# Patient Record
Sex: Female | Born: 1935 | Race: White | Hispanic: No | Marital: Married | Smoking: Never smoker
Health system: Southern US, Community
[De-identification: ages and names within clinical notes are randomized; demographics above are authoritative.]

## PROBLEM LIST (undated history)

## (undated) DIAGNOSIS — K759 Inflammatory liver disease, unspecified: Secondary | ICD-10-CM

## (undated) DIAGNOSIS — G4730 Sleep apnea, unspecified: Secondary | ICD-10-CM

## (undated) DIAGNOSIS — IMO0001 Reserved for inherently not codable concepts without codable children: Secondary | ICD-10-CM

## (undated) DIAGNOSIS — I341 Nonrheumatic mitral (valve) prolapse: Secondary | ICD-10-CM

## (undated) DIAGNOSIS — M1990 Unspecified osteoarthritis, unspecified site: Secondary | ICD-10-CM

## (undated) DIAGNOSIS — K219 Gastro-esophageal reflux disease without esophagitis: Secondary | ICD-10-CM

## (undated) DIAGNOSIS — I10 Essential (primary) hypertension: Secondary | ICD-10-CM

## (undated) DIAGNOSIS — J40 Bronchitis, not specified as acute or chronic: Secondary | ICD-10-CM

## (undated) DIAGNOSIS — E785 Hyperlipidemia, unspecified: Secondary | ICD-10-CM

## (undated) DIAGNOSIS — Z5189 Encounter for other specified aftercare: Secondary | ICD-10-CM

## (undated) DIAGNOSIS — J449 Chronic obstructive pulmonary disease, unspecified: Secondary | ICD-10-CM

## (undated) DIAGNOSIS — D649 Anemia, unspecified: Secondary | ICD-10-CM

## (undated) DIAGNOSIS — M7580 Other shoulder lesions, unspecified shoulder: Secondary | ICD-10-CM

## (undated) HISTORY — DX: Essential (primary) hypertension: I10

## (undated) HISTORY — PX: ANTERIOR CERVICAL DECOMP/DISCECTOMY FUSION: SHX1161

## (undated) HISTORY — DX: Nonrheumatic mitral (valve) prolapse: I34.1

## (undated) HISTORY — PX: HERNIA REPAIR: SHX51

## (undated) HISTORY — DX: Hyperlipidemia, unspecified: E78.5

## (undated) HISTORY — DX: Reserved for inherently not codable concepts without codable children: IMO0001

## (undated) HISTORY — PX: UVULECTOMY: SHX2631

## (undated) HISTORY — DX: Anemia, unspecified: D64.9

## (undated) HISTORY — PX: KNEE ARTHROSCOPY: SUR90

## (undated) HISTORY — PX: SPINE SURGERY: SHX786

## (undated) HISTORY — PX: HEMORRHOID SURGERY: SHX153

## (undated) HISTORY — DX: Other shoulder lesions, unspecified shoulder: M75.80

## (undated) HISTORY — DX: Chronic obstructive pulmonary disease, unspecified: J44.9

## (undated) HISTORY — DX: Encounter for other specified aftercare: Z51.89

---

## 1962-11-10 HISTORY — PX: ABDOMINAL HYSTERECTOMY: SHX81

## 1998-07-24 ENCOUNTER — Ambulatory Visit (HOSPITAL_COMMUNITY): Admission: RE | Admit: 1998-07-24 | Discharge: 1998-07-24 | Payer: Self-pay | Admitting: Family Medicine

## 1998-08-16 ENCOUNTER — Encounter: Payer: Self-pay | Admitting: Family Medicine

## 1998-08-16 ENCOUNTER — Ambulatory Visit (HOSPITAL_COMMUNITY): Admission: RE | Admit: 1998-08-16 | Discharge: 1998-08-16 | Payer: Self-pay | Admitting: Family Medicine

## 1998-10-10 ENCOUNTER — Ambulatory Visit (HOSPITAL_COMMUNITY): Admission: RE | Admit: 1998-10-10 | Discharge: 1998-10-10 | Payer: Self-pay | Admitting: Gastroenterology

## 1998-11-10 HISTORY — PX: TONGUE BIOPSY: SHX1075

## 1999-06-10 ENCOUNTER — Ambulatory Visit: Admission: RE | Admit: 1999-06-10 | Discharge: 1999-06-10 | Payer: Self-pay | Admitting: *Deleted

## 1999-09-12 ENCOUNTER — Ambulatory Visit (HOSPITAL_COMMUNITY): Admission: RE | Admit: 1999-09-12 | Discharge: 1999-09-12 | Payer: Self-pay | Admitting: Family Medicine

## 1999-09-12 ENCOUNTER — Encounter: Payer: Self-pay | Admitting: Family Medicine

## 2000-10-28 ENCOUNTER — Encounter: Payer: Self-pay | Admitting: Family Medicine

## 2000-10-28 ENCOUNTER — Ambulatory Visit (HOSPITAL_COMMUNITY): Admission: RE | Admit: 2000-10-28 | Discharge: 2000-10-28 | Payer: Self-pay | Admitting: Family Medicine

## 2001-04-01 ENCOUNTER — Encounter (INDEPENDENT_AMBULATORY_CARE_PROVIDER_SITE_OTHER): Payer: Self-pay | Admitting: Specialist

## 2001-04-01 ENCOUNTER — Ambulatory Visit (HOSPITAL_BASED_OUTPATIENT_CLINIC_OR_DEPARTMENT_OTHER): Admission: RE | Admit: 2001-04-01 | Discharge: 2001-04-02 | Payer: Self-pay | Admitting: *Deleted

## 2001-10-28 ENCOUNTER — Other Ambulatory Visit: Admission: RE | Admit: 2001-10-28 | Discharge: 2001-10-28 | Payer: Self-pay | Admitting: Family Medicine

## 2001-10-29 ENCOUNTER — Encounter: Payer: Self-pay | Admitting: Family Medicine

## 2001-10-29 ENCOUNTER — Ambulatory Visit (HOSPITAL_COMMUNITY): Admission: RE | Admit: 2001-10-29 | Discharge: 2001-10-29 | Payer: Self-pay | Admitting: Family Medicine

## 2002-11-16 ENCOUNTER — Ambulatory Visit (HOSPITAL_COMMUNITY): Admission: RE | Admit: 2002-11-16 | Discharge: 2002-11-16 | Payer: Self-pay | Admitting: Family Medicine

## 2002-11-16 ENCOUNTER — Encounter: Payer: Self-pay | Admitting: Family Medicine

## 2004-01-09 ENCOUNTER — Ambulatory Visit (HOSPITAL_COMMUNITY): Admission: RE | Admit: 2004-01-09 | Discharge: 2004-01-09 | Payer: Self-pay | Admitting: Family Medicine

## 2004-01-24 ENCOUNTER — Ambulatory Visit (HOSPITAL_COMMUNITY): Admission: RE | Admit: 2004-01-24 | Discharge: 2004-01-24 | Payer: Self-pay | Admitting: Gastroenterology

## 2005-02-03 ENCOUNTER — Ambulatory Visit (HOSPITAL_COMMUNITY): Admission: RE | Admit: 2005-02-03 | Discharge: 2005-02-03 | Payer: Self-pay | Admitting: Family Medicine

## 2006-04-02 ENCOUNTER — Ambulatory Visit (HOSPITAL_COMMUNITY): Admission: RE | Admit: 2006-04-02 | Discharge: 2006-04-02 | Payer: Self-pay | Admitting: Family Medicine

## 2006-11-10 DIAGNOSIS — M7580 Other shoulder lesions, unspecified shoulder: Secondary | ICD-10-CM

## 2006-11-10 HISTORY — DX: Other shoulder lesions, unspecified shoulder: M75.80

## 2007-01-04 ENCOUNTER — Observation Stay (HOSPITAL_COMMUNITY): Admission: RE | Admit: 2007-01-04 | Discharge: 2007-01-05 | Payer: Self-pay | Admitting: Orthopaedic Surgery

## 2007-03-06 ENCOUNTER — Emergency Department (HOSPITAL_COMMUNITY): Admission: EM | Admit: 2007-03-06 | Discharge: 2007-03-06 | Payer: Self-pay | Admitting: Emergency Medicine

## 2007-03-10 ENCOUNTER — Ambulatory Visit (HOSPITAL_COMMUNITY): Admission: RE | Admit: 2007-03-10 | Discharge: 2007-03-10 | Payer: Self-pay | Admitting: Orthopaedic Surgery

## 2007-03-10 ENCOUNTER — Encounter: Payer: Self-pay | Admitting: Vascular Surgery

## 2007-03-10 ENCOUNTER — Ambulatory Visit: Payer: Self-pay | Admitting: Vascular Surgery

## 2007-04-06 ENCOUNTER — Ambulatory Visit (HOSPITAL_COMMUNITY): Admission: RE | Admit: 2007-04-06 | Discharge: 2007-04-06 | Payer: Self-pay | Admitting: Family Medicine

## 2007-12-22 ENCOUNTER — Encounter: Admission: RE | Admit: 2007-12-22 | Discharge: 2007-12-22 | Payer: Self-pay | Admitting: Otolaryngology

## 2008-04-18 ENCOUNTER — Ambulatory Visit (HOSPITAL_COMMUNITY): Admission: RE | Admit: 2008-04-18 | Discharge: 2008-04-18 | Payer: Self-pay | Admitting: Family Medicine

## 2008-08-09 ENCOUNTER — Ambulatory Visit (HOSPITAL_COMMUNITY): Admission: RE | Admit: 2008-08-09 | Discharge: 2008-08-10 | Payer: Self-pay | Admitting: Orthopaedic Surgery

## 2009-05-15 ENCOUNTER — Encounter: Admission: RE | Admit: 2009-05-15 | Discharge: 2009-05-15 | Payer: Self-pay | Admitting: Otolaryngology

## 2009-05-23 ENCOUNTER — Ambulatory Visit (HOSPITAL_COMMUNITY): Admission: RE | Admit: 2009-05-23 | Discharge: 2009-05-23 | Payer: Self-pay | Admitting: Family Medicine

## 2010-06-03 ENCOUNTER — Ambulatory Visit (HOSPITAL_COMMUNITY): Admission: RE | Admit: 2010-06-03 | Discharge: 2010-06-03 | Payer: Self-pay | Admitting: Family Medicine

## 2011-03-25 NOTE — Op Note (Signed)
NAMEDominga, Mcduffie Leta                ACCOUNT NO.:  [id number]   MEDICAL RECORD NO.:  [id number]          PATIENT TYPE:  OIB   LOCATION:  5024                         FACILITY:  [facility]   PHYSICIAN:  Mark C. Ophelia Charter, M.D.    DATE OF BIRTH:  Mar 08, 1930   DATE OF PROCEDURE:  08/09/2008  DATE OF DISCHARGE:                               OPERATIVE REPORT   PREOPERATIVE DIAGNOSIS:  Cervical spondylosis C4-5, C6-7.   POSTOPERATIVE DIAGNOSIS:  Cervical spondylosis C4-5, C6-7.   PROCEDURE:  C4-5, C6-7 anterior cervical diskectomy and fusion allograft  plates.   SURGEON:  Mark C. Ophelia Charter, MD   ANESTHESIA:  GOT plus 7 mL Marcaine local.   ASSISTANT:  Maud Deed, PA-C   EBL:  Minimal.   DRAINS:  One Hemovac.   This 77 year old female had significant spondylosis at C4-5 and C5-6  with foraminal stenosis on the left at C4-5 and also C6-7 with  relative  sparing of the C5-6 level.  She has left arm and shoulder symptoms.  Risks and benefits discussed with her and plan was for fusion of the two  worse levels leaving the C5-6 level alone.   After induction of general anesthesia orotracheal intubation,  preoperative Ancef prophylaxis, standard prepping and draping with a  sterile Mayo stand at the head, thyroid sheets and drapes, sterile skin  marker, Betadine and Biodrape on the skin, a surgical checklist time-out  was completed and Ancef was given prophylactically.  Incision was made  overlying the C5 vertebrae based on palpation of the cricothyroid  cartilage, larynx and carotid tubercle.  Incision was made from skin  fold starting at the midline and extending to the left.  Platysma was  elevated cephalad and caudad.  Two small vertical veins were coagulated.  Blunt dissection with carotid sheath lateral with its contents down the  longus colli muscle was performed.  A subperiosteal dissection over the  large spurs and needle was placed top of the three spurs with the upper  and lower spurs  being the largest.  As expected this was at the C4-5  level and self-retaining Cloward retractors were placed.  Teeth blades  right and left, smooth blades up and down, bur was used to take off the  spurs protecting the esophagus carefully.  Disk space was about 2 mm.  Bur was used to open it up and then hand curetting with Cloward curettes  to strip disk out and endplates.  Continued burring posteriorly with the  significant posterior spurring which was causing narrowing the AP  diameter.  Operative microscope was draped and brought in and  microdissection techniques were used to remove the posterior spurs,  thick posterior longitudinal ligament.  There was no extruded disk  fragments but there was significant spurring and the gap between the  spurs followed in a regular curvature all the way across the posterior  gap from the spondylosis.  Once spurs were picked out and dura was  decompressed with no areas of compression, sizing showed a 7-mm, gave a  nice fit, traction was applied by the CRNA and the graft  was inserted,  was slightly turned that gave an excellent tight fit and could not be  backed out.  Neck was turned.  The graft was solid, would not move and  was left in place.  Identical procedures were then performed at C6-7  moving the operative microscope out of the way replacing the self-  retaining retractors and then proceeding with diskectomy at C6-7 using  the operative microscope.  There was some old extruded disk material  with spurring present central and primarily left which was causing  compression which is most likely responsible for the patient's symptoms.  There was uncovertebral spurring at C4-5 level but not as severe as on  the left C6-7 and considerable time was taken to remove the spurs using  the 1-mm Kerrison black nerve hook and fluoro Cloward curette.  Once the  spur was removed, the dura was decompressed and was bulging into the gap  between C6 and C7.   Sizers showed the sixth graft gave a nice tight fit.  Traction was applied again, it was inserted.  It was slightly turned and  a Kelly clamp was used to turn it back so it lined up straight with the  anterior mark.  Both grafts have been rehydrated.  EBI Biomet plate was  selected and the top edge of the spur at C5 had to be burred away  slightly so that the upper plate was set flat against the vertebral  body.  Both plates were checked with AP and lateral fluoroscopy and all  eight screw holes were filled.  The prong holder on the bottom plate as  the first screw was hand drilled the plate actually could not migrate  slightly caudad.  Screws were still above the graft and solid bone.  Fluoroscopic pictures were taken showing good position of the plates.  Operative field was irrigated.  Hemovac was placed with an in-and-out  technique in line with the skin incision.  Platysma closed with 3-0  Vicryl and 4-0 Vicryl subcuticular closure.  Tincture of benzoin, Steri-  Strips, and Marcaine infiltration postop dressing and soft cervical  collar.  Instrument count and needle count was correct.      Mark C. Ophelia Charter, M.D.  Electronically Signed     MCY/MEDQ  D:  08/09/2008  T:  08/10/2008  Job:  [id number]

## 2011-03-28 NOTE — Op Note (Signed)
[name], Caitlyn [name]                ACCOUNT NO.:  [id number]   MEDICAL RECORD NO.:  [id number]          PATIENT TYPE:  OBV   LOCATION:  5004                         FACILITY:  [facility]   PHYSICIAN:  Mark C. Ophelia Charter, M.D.    DATE OF BIRTH:  [birth year]   DATE OF PROCEDURE:  01/04/2007  DATE OF DISCHARGE:  01/05/2007                               OPERATIVE REPORT   PREOPERATIVE DIAGNOSIS:  L4-5 stenosis.   POSTOPERATIVE DIAGNOSIS:  L4-5 stenosis.   PROCEDURE:  Decompression L4-5 with partial hemilaminectomy and  foraminotomy.   SURGEON:  Mark C. Ophelia Charter, M.D.   ANESTHESIA:  __GOT________ . plus local   This 77 year old female has significant stenosis with apex of the curve  at L2-3, has severe stenosis at L4-5 with near complete obliteration due  to ligamentum hypertrophy primarily.  She does have some degenerative  anterolisthesis and we had discussed fusion with a long versus short  segment fusion as well as decompression.  She elected to proceed with a  limited decompression and is brought in at this time for bilateral  partial hemilaminectomy with foraminotomy.   After introduction of general anesthesia, the patient placed on chest  rolls, prepped with Ancef. The skin was then prepped and draped and this  was performed with ____durapred______  sterile prep and Betadine by  drape application.  An incision was made at the midline after needle  localization a spinal view confirmed that the needle was at the 4-5  level.  Initially the right side was done first since the patient is  having primarily right leg symptoms in addition to her claudication  symptoms.  Patient had 1.3-cm length of severe stenosis and plan was for  a limited decompression.  Edge of the lamina was identified and a  keyhole opening was made removing some overhanging portion of the facet,  but staying inside the inner board of the pedicle.  Thick chunks of  ligament were removed and the spinous process was left  intact as well as  interspinous ligament.  The scope was angled up and down as the  ligaments removed  underneath the lamina.  This was palpated.  Chunks of  ligament were removed in the lateral gutter.  The scope was then moved  to the opposite side, switch places with the RNFA, and the identified  procedure was performed removing chunks of ligament, decompressing the  foramina, moving bone at the level of the pedicle until stenosis was  relieved.  There was still some anterolisthesis as seen by the  intraoperative x-ray from the degenerative facets.  However, with this  partial decompression she had more freedom for the nerve root in the  foramina enough where this may buy her some time versus a more invasive  decompression and fusion with instrumentation.  Dura was intact.  The  operative field was irrigated.  Final passes were made into the dura, at  the foramina, and no areas of omitting significant compression was  present other than secondary to the anterolisthesis,  but with ligaments removed and some spur overhanging nerve roots were  much  more free.  Fascia was closed with 0 Vicryl, 2-0 Vicryl in  subcutaneous tissue, 4-0 Vicryl subcuticular closure.  __T of  Benzoin________  Steri-Strips, Marcaine __________ , postop dressings.      Mark C. Ophelia Charter, M.D.  Electronically Signed     MCY/MEDQ  D:  01/04/2007  T:  01/05/2007  Job:  [id number]

## 2011-03-28 NOTE — Op Note (Signed)
[facility]  Patient:    Caitlyn [name], Caitlyn [name] [facility]                         MRN: [id number] Proc. Date: 04/01/01 Adm. Date:  [id number] Attending:  Claudina Lick                           Operative Report  PREOPERATIVE DIAGNOSIS: 1. Obstructive sleep apnea syndrome with snoring. 2. Deviated nasal septum. 3. Nasal turbinate hypertrophy.  POSTOPERATIVE DIAGNOSIS: 1. Obstructive sleep apnea syndrome with snoring. 2. Deviated nasal septum. 3. Nasal turbinate hypertrophy.  OPERATION PERFORMED: 1. Nasal septoplasty. 2. Submucous resection of right inferior nasal turbinate. 3. Uvulopalatopharyngoplasty.  SURGEON:  Robert L. Lyman Bishop, M.D.  ANESTHESIA:  General.  INDICATIONS FOR PROCEDURE:  The patient has had a longstanding history of obstructive sleep apnea with severe snoring.  Sleep study showed moderate obstructive apnea with some desaturatization.  Examination showed anterior septal deviation to the right, enlarged inferior turbinates, left greater than right and a thickened redundant uvula and soft palate.  The CPAP was discussed with the patient.  She absolutely refused that as she felt she could not psychologically tolerate that.  She is admitted for surgery.  DESCRIPTION OF PROCEDURE:  After satisfactory general endotracheal anesthesia had been induced, topical epinephrine packs were placed intranasally after which the septum, both inferior nasal turbinates and the soft palate were infiltrated with 1% Xylocaine containing 1:100,000 epinephrine using a total of 6 cc after which the nose and face were prepped with Betadine and sterile drapes applied.  The patient had a moderate anterior septal deviation to the right, enlarged inferior turbinates, again left greater than right.  Small incision was made over the left inferior nasal turbinate and the mucoperiosteum elevated.  A small amount of turbinate bone removed.  The remainder crushed  and lateralizing incision closed with interrupted 5-0 chromic gut.  A right anteroseptal incision was made in the mucoperichondrium. Periosteum elevated on the right side.   The cartilage was slightly deflected off the maxillary crest to the right.  It was dissected up, then partially separated from the bony septum and a small portion of the cartilage inferiorly which projected into the right nares was excised.  The perpendicular plate was straight and midline and did not require any resection.  The cartilage was then repositioned in the midline where it was stabilized with two mattress sutures placed in the [facility] technique using 5-0 chromic.  The incision was then closed with running 5-0 chromic gut and the septal flaps were reapposed to the septal cartilage with a mattress suture of 4-0 plain gut.  The right inferior turbinate was crushed and lateralized.  I did not feel it was enlarged enlarged enough to require a resection.  Both inferior turbinates were infiltrated with 20 mg of Depo-Medrol.  A Crowe-Davis mouth gag was then inserted and suspended from a Mayo stand.  The lower 3 to 4 mm or free margin of the soft palate including the entire uvula was then excised with a scalpel. Bleeding controlled with pressure and the anterior and posterior mucosal surfaces were then sutured together using running 5-0 chromic gut.  The patient was then suctioned.  Estimated blood loss for this procedure was less than 10 cc.  The patient tolerated the procedure well, was awakened from anesthesia and taken to the recovery room in satisfactory condition. DD:  04/01/01 TD:  04/01/01  Job: [id number]

## 2011-03-28 NOTE — Op Note (Signed)
NAMEBabygirl, Trager Indianna S                          ACCOUNT NO.:  [id number]   MEDICAL RECORD NO.:  [id number]                   PATIENT TYPE:  AMB   LOCATION:  ENDO                                 FACILITY:  [facility]   PHYSICIAN:  John C. Madilyn Fireman, M.D.                 DATE OF BIRTH:  09-Feb-1930   DATE OF PROCEDURE:  01/24/2004  DATE OF DISCHARGE:                                 OPERATIVE REPORT   PROCEDURE:  Colonoscopy.   INDICATIONS FOR PROCEDURE:  Average-risk colon cancer screening in a 77-year-  old patient with no recent screening.   DESCRIPTION OF PROCEDURE:  The patient was placed in the left lateral  decubitus position and placed on the pulse monitor with continuous low-flow  oxygen delivered by nasal cannula.  She was sedated with 75 mcg IV fentanyl  and 9 mg IV Versed.  The video colonoscope was inserted into the rectum and  advanced to the cecum, confirmed by transillumination of McBurney's point  and visualization of the ileocecal valve and appendiceal orifice.  Prep was  good.  The cecum, ascending, transverse, descending, and sigmoid colon all  appeared normal with the exception of one or two diverticula seen only in  the proximal ascending colon.  There were no sigmoid diverticula.  The  rectum appeared normal and retroflexed view of the anus revealed no obvious  internal hemorrhoids.  The scope was then withdrawn and the patient returned  to the recovery room in stable condition.  She tolerated the procedure well  and there were no immediate complications.   IMPRESSION:  Rare ascending colon diverticula; otherwise normal study.   PLAN:  Consider next colon screening by sigmoidoscopy __________in five  years and colonoscopy in 10 years.                                               John C. Madilyn Fireman, M.D.    JCH/MEDQ  D:  01/24/2004  T:  01/24/2004  Job:  [id number]   cc:   Dellis Anes. Idell Pickles, M.D.  [address]  [place]  [place] [address]  Fax: [phone]

## 2011-05-27 HISTORY — PX: NM MYOCAR PERF WALL MOTION: HXRAD629

## 2011-05-30 ENCOUNTER — Other Ambulatory Visit (HOSPITAL_COMMUNITY): Payer: Self-pay | Admitting: Family Medicine

## 2011-05-30 DIAGNOSIS — Z1231 Encounter for screening mammogram for malignant neoplasm of breast: Secondary | ICD-10-CM

## 2011-06-30 ENCOUNTER — Ambulatory Visit (HOSPITAL_COMMUNITY)
Admission: RE | Admit: 2011-06-30 | Discharge: 2011-06-30 | Disposition: A | Discharge status: Home / Self Care | Payer: Medicare Other | Source: Ambulatory Visit | Attending: Family Medicine | Admitting: Family Medicine

## 2011-06-30 DIAGNOSIS — Z1231 Encounter for screening mammogram for malignant neoplasm of breast: Secondary | ICD-10-CM

## 2011-08-11 LAB — URINALYSIS, ROUTINE W REFLEX MICROSCOPIC
Glucose, UA: NEGATIVE
Protein, ur: NEGATIVE
Specific Gravity, Urine: 1.013
Urobilinogen, UA: 0.2
pH: 7.5

## 2011-08-11 LAB — COMPREHENSIVE METABOLIC PANEL
Alkaline Phosphatase: 66
CO2: 29
Chloride: 103
GFR calc non Af Amer: >60

## 2011-08-11 LAB — PROTIME-INR: INR: 1

## 2011-08-11 LAB — DIFFERENTIAL
Basophils Relative: 1
Lymphocytes Relative: 31
Lymphs Abs: 1.8
Monocytes Absolute: 0.4
Monocytes Relative: 7
Neutro Abs: 3.5
Neutrophils Relative %: 60

## 2011-08-11 LAB — CBC
HCT: 36.5
RDW: 13.7
WBC: 5.9

## 2011-08-11 LAB — URINE MICROSCOPIC-ADD ON

## 2011-08-11 LAB — APTT: aPTT: 33

## 2012-06-01 ENCOUNTER — Other Ambulatory Visit: Payer: Self-pay | Admitting: Family Medicine

## 2012-06-01 DIAGNOSIS — R1900 Intra-abdominal and pelvic swelling, mass and lump, unspecified site: Secondary | ICD-10-CM

## 2012-06-02 ENCOUNTER — Other Ambulatory Visit: Payer: Self-pay | Admitting: Family Medicine

## 2012-06-02 ENCOUNTER — Ambulatory Visit
Admission: RE | Admit: 2012-06-02 | Discharge: 2012-06-02 | Disposition: A | Discharge status: Home / Self Care | Payer: Medicare Other | Source: Ambulatory Visit | Attending: Family Medicine | Admitting: Family Medicine

## 2012-06-02 DIAGNOSIS — R1900 Intra-abdominal and pelvic swelling, mass and lump, unspecified site: Secondary | ICD-10-CM

## 2012-06-04 ENCOUNTER — Encounter (INDEPENDENT_AMBULATORY_CARE_PROVIDER_SITE_OTHER): Payer: Self-pay | Admitting: General Surgery

## 2012-06-04 ENCOUNTER — Ambulatory Visit (INDEPENDENT_AMBULATORY_CARE_PROVIDER_SITE_OTHER): Payer: Medicare Other | Admitting: General Surgery

## 2012-06-04 VITALS — BP 124/82 | HR 71 | Temp 97.6°F | Resp 16 | Ht 66.5 in | Wt 159.8 lb

## 2012-06-04 DIAGNOSIS — R1031 Right lower quadrant pain: Secondary | ICD-10-CM

## 2012-06-04 NOTE — Progress Notes (Signed)
Patient ID: Caitlyn [name], female   DOB: 11/06/1930, 78 y.o.   MRN: [id number]  No chief complaint on file.   HPI Caitlyn [name] is a 78 y.o. female.  HPI This patient was referred by Dr. Little for evaluation of right groin pain which has been present for about 4-5 weeks. She says that this began after severe coughing episode. Since then she has had pain in her right lower quadrant and right groin area which is worse with sitting or bending and relieved by straining up or lying flat and taking Advil. She describes as a sharp pains and has some chronic constipation but no nausea and vomiting. She denies any blood in the stools or melena. She is current on her colonoscopy. She had an ultrasound which was concerning for a possible inguinal hernia but she has not noticed any bulge in that area.  Past Medical History  Diagnosis Date  . Anemia   . Blood transfusion   . Hyperlipidemia   . Hypertension   . Osteoporosis   . COPD (chronic obstructive pulmonary disease)   . AC (acromioclavicular) joint bone spurs 2008    Back    Past Surgical History  Procedure Date  . Spine surgery   . Anterior cervical decomp/discectomy fusion   . Hemorrhoid surgery 1969, 1971  . Knee arthroscopy 2007 & 2008  . Tongue biopsy 2000  . Abdominal hysterectomy 1964    No family history on file.  Social History History  Substance Use Topics  . Smoking status: Never Smoker   . Smokeless tobacco: Not on file  . Alcohol Use: Yes    Allergies  Allergen Reactions  . Statins     Patient indicated she was not able to tolerate medications containing Statins     Current Outpatient Prescriptions  Medication Sig Dispense Refill  . alendronate (FOSAMAX) 70 MG tablet       . aspirin 81 MG tablet Take 81 mg by mouth daily.      . Ibuprofen (ADVIL PO) Take by mouth.      . metoprolol tartrate (LOPRESSOR) 25 MG tablet Take 25 mg by mouth 2 (two) times daily.      . Pitavastatin Calcium (LIVALO PO) Take by  mouth.      . Red Yeast Rice Extract (RED YEAST RICE PO) Take by mouth.      . azithromycin (ZITHROMAX) 250 MG tablet       . BOOSTRIX 5-2.5-18.5 injection         Review of Systems Review of Systems All other review of systems negative or noncontributory except as stated in the HPI  Blood pressure 124/82, pulse 71, temperature 97.6 F (36.4 C), temperature source Temporal, resp. rate 16, height 5' 6.5" (1.689 m), weight 159 lb 12.8 oz (72.485 kg).  Physical Exam Physical Exam Physical Exam  Nursing note and vitals reviewed. Constitutional: She is oriented to person, place, and time. She appears well-developed and well-nourished. No distress.  HENT:  Head: Normocephalic and atraumatic.  Mouth/Throat: No oropharyngeal exudate.  Eyes: Conjunctivae and EOM are normal. Pupils are equal, round, and reactive to light. Right eye exhibits no discharge. Left eye exhibits no discharge. No scleral icterus.  Neck: Normal range of motion. Neck supple. No tracheal deviation present.  Cardiovascular: Normal rate, regular rhythm, normal heart sounds and intact distal pulses.   Pulmonary/Chest: Effort normal and breath sounds normal. No stridor. No respiratory distress. She has no wheezes.  Abdominal: Soft. Bowel sounds are normal.   She exhibits no distension and no mass. There is no tenderness. There is no rebound and no guarding. No bulge with valsalva near lateral incision or inguinal region. Musculoskeletal: Normal range of motion. She exhibits no edema and no tenderness.  Neurological: She is alert and oriented to person, place, and time.  Skin: Skin is warm and dry. No rash noted. She is not diaphoretic. No erythema. No pallor.  Psychiatric: She has a normal mood and affect. Her behavior is normal. Judgment and thought content normal.    Data Reviewed US  Assessment    Right-sided groin pain She has a history which is concerning for a possible right inguinal hernia but I do not appreciate  any hernia with Valsalva on exam at either her prior C-section incision or in the inguinal region. She had an ultrasound which was suspicious for a possible fat-containing hernia but again, I do not appreciate one on exam. This could be a muscle strain from her coughing episodes or it could be a hernia. We will check a CT scan of the pelvis for further evaluation. She will see me back after her CT    Plan    If her CT demonstrates evidence of a hernia, we will discuss surgical options. If it is negative, we will continue with watchful waiting and observation.       Aquila Menzie DAVID 06/04/2012, 1:53 PM    

## 2012-06-07 ENCOUNTER — Ambulatory Visit
Admission: RE | Admit: 2012-06-07 | Discharge: 2012-06-07 | Disposition: A | Discharge status: Home / Self Care | Payer: Medicare Other | Source: Ambulatory Visit | Attending: General Surgery | Admitting: General Surgery

## 2012-06-07 DIAGNOSIS — R1031 Right lower quadrant pain: Secondary | ICD-10-CM

## 2012-06-07 HISTORY — PX: US ECHOCARDIOGRAPHY: HXRAD669

## 2012-06-07 MED ORDER — IOHEXOL 300 MG/ML  SOLN
100.0000 mL | Freq: Once | INTRAMUSCULAR | Status: AC | PRN
  Administered 2012-06-07: 100 mL via INTRAVENOUS

## 2012-06-08 ENCOUNTER — Telehealth (INDEPENDENT_AMBULATORY_CARE_PROVIDER_SITE_OTHER): Payer: Self-pay

## 2012-06-08 NOTE — Telephone Encounter (Signed)
Patient calling in for her CT results- Impression on CT Report states patient has Right Inguinal Hernia.  Patient is aware that Dr. Biagio Quint will look at images from scan and we will contact patient with results and treatment plan.

## 2012-06-09 ENCOUNTER — Telehealth (INDEPENDENT_AMBULATORY_CARE_PROVIDER_SITE_OTHER): Payer: Self-pay | Admitting: General Surgery

## 2012-06-09 NOTE — Telephone Encounter (Signed)
Pt calling for test results; paged Dr. Biagio Quint to call her on cell phone:   [phone].

## 2012-06-11 ENCOUNTER — Telehealth (INDEPENDENT_AMBULATORY_CARE_PROVIDER_SITE_OTHER): Payer: Self-pay | Admitting: General Surgery

## 2012-06-11 ENCOUNTER — Other Ambulatory Visit (INDEPENDENT_AMBULATORY_CARE_PROVIDER_SITE_OTHER): Payer: Self-pay | Admitting: General Surgery

## 2012-06-11 DIAGNOSIS — R1031 Right lower quadrant pain: Secondary | ICD-10-CM

## 2012-06-11 NOTE — Telephone Encounter (Signed)
Pt calling to inform Dr. Biagio Quint she has decided to proceed with surgery and wants it ASAP, please.  Notified Dr. Biagio Quint of same.

## 2012-06-14 ENCOUNTER — Encounter (HOSPITAL_COMMUNITY): Payer: Self-pay | Admitting: Pharmacy Technician

## 2012-06-14 ENCOUNTER — Encounter (INDEPENDENT_AMBULATORY_CARE_PROVIDER_SITE_OTHER): Payer: Self-pay

## 2012-06-15 ENCOUNTER — Encounter (HOSPITAL_COMMUNITY): Payer: Self-pay

## 2012-06-15 ENCOUNTER — Encounter (HOSPITAL_COMMUNITY): Payer: Self-pay | Admitting: Vascular Surgery

## 2012-06-15 ENCOUNTER — Encounter (HOSPITAL_COMMUNITY)
Admission: RE | Admit: 2012-06-15 | Discharge: 2012-06-15 | Disposition: A | Discharge status: Home / Self Care | Payer: Medicare Other | Source: Ambulatory Visit | Attending: General Surgery | Admitting: General Surgery

## 2012-06-15 VITALS — BP 145/83 | HR 84 | Temp 98.2°F | Resp 20 | Ht 66.0 in | Wt 159.6 lb

## 2012-06-15 DIAGNOSIS — R1031 Right lower quadrant pain: Secondary | ICD-10-CM

## 2012-06-15 HISTORY — DX: Bronchitis, not specified as acute or chronic: J40

## 2012-06-15 HISTORY — DX: Inflammatory liver disease, unspecified: K75.9

## 2012-06-15 HISTORY — DX: Sleep apnea, unspecified: G47.30

## 2012-06-15 HISTORY — DX: Gastro-esophageal reflux disease without esophagitis: K21.9

## 2012-06-15 HISTORY — DX: Unspecified osteoarthritis, unspecified site: M19.90

## 2012-06-15 LAB — HEPATIC FUNCTION PANEL
Albumin: 3.9 g/dL (ref 3.5–5.2)
Bilirubin, Direct: 0.1 mg/dL (ref 0.0–0.3)
Indirect Bilirubin: 0.7 mg/dL (ref 0.3–0.9)
Total Bilirubin: 0.8 mg/dL (ref 0.3–1.2)

## 2012-06-15 LAB — CBC
MCHC: 33.3 g/dL (ref 30.0–36.0)
RDW: 13.6 % (ref 11.5–15.5)

## 2012-06-15 LAB — BASIC METABOLIC PANEL
BUN: 18 mg/dL (ref 6–23)
Creatinine, Ser: 0.71 mg/dL (ref 0.50–1.10)
GFR calc Af Amer: >90 mL/min (ref >90)
GFR calc non Af Amer: 79 mL/min — ABNORMAL LOW (ref >90)
Potassium: 4.7 mEq/L (ref 3.5–5.1)

## 2012-06-15 LAB — SURGICAL PCR SCREEN: MRSA, PCR: NEGATIVE

## 2012-06-15 MED ORDER — CEFAZOLIN SODIUM-DEXTROSE 2-3 GM-% IV SOLR
2.0000 g | INTRAVENOUS | Status: AC
  Administered 2012-06-16: 2 g via INTRAVENOUS

## 2012-06-15 NOTE — Progress Notes (Signed)
Spoke with Caitlyn [name] in Medical records with Dr. Kandis Cocking office, requested most recent stress test and office visit notes.  Echo/EKG/Stress/ office visit notes and recent lab  Forwarded to [facility] for review.

## 2012-06-15 NOTE — Progress Notes (Signed)
Contacted Dr. Kandis Cocking office, Spoke with Kirt Boys, requested recent Echo, EKG and clearance letter.

## 2012-06-15 NOTE — Pre-Procedure Instructions (Signed)
20 Caitlyn [name]  06/15/2012   Your procedure is scheduled on: Wednesday June 16, 2012  Report to [facility] Stay [facility] at 9:30 AM.  Call this number if you have problems the morning of surgery: [phone]   Remember:   Do not eat food or drink:After Midnight.  May have clear liquids:until Midnight .  Clear liquids include soda, tea, black coffee, apple or grape juice, broth.  Take these medicines the morning of surgery with A SIP OF WATER: metoprolol,   Do not wear jewelry, make-up or nail polish.  Do not wear lotions, powders, or perfumes. You may wear deodorant.  Do not shave 48 hours prior to surgery. Men may shave face and neck.  Do not bring valuables to the hospital.  Contacts, dentures or bridgework may not be worn into surgery.  Leave suitcase in the car. After surgery it may be brought to your room.  For patients admitted to the hospital, checkout time is 11:00 AM the day of discharge.   Patients discharged the day of surgery will not be allowed to drive home.  Name and phone number of your driver: Caitlyn [name] [phone]  Special Instructions: CHG Shower Use Special Wash: 1/2 bottle night before surgery and 1/2 bottle morning of surgery.   Please read over the following fact sheets that you were given: Pain Booklet, Coughing and Deep Breathing, MRSA Information and Surgical Site Infection Prevention

## 2012-06-15 NOTE — Consult Note (Signed)
Anesthesia Chart Review:  Patient is an 78 year old female scheduled for right inguinal hernia or incisional hernia repair with mesh by Dr. Biagio Quint on 06/16/2012.  Her PAT visit was on 06/15/12.    History includes nonsmoker, COPD, GERD, hyperlipidemia, anemia, osteoporosis, obstructive sleep apnea, arthritis, bronchitis, hypertension, hepatitis (patient unsure of which type) '70's, MVP, prior cervical fusion.  Her cardiologist is Dr. Alanda Amass [facility]). She was last seen on 05/06/2012.  EKG then showed SR with marked sinus arrhythmia.  He ordered an echocardiogram that was done on 06/07/2012. Showed mild concentric LVH, normal left ventricular systolic function, findings suggestive of impaired LV relaxation, moderately dilated LA, mild to moderately dilated RA, mild mitral valve prolapse, prolapse of the posterior mitral leaflet, mild mitral annular calcification, mild to moderate mitral regurgitation, mild to moderate tricuspid regurgitation, right ventricular systolic pressure elevated at 28-41 mmHg, AV appeared to be mildly sclerotic, there was aortic root sclerosis/calcification, insignificant pericardial effusion.  Her last stress test was on 05/27/11 showed normal myocardial perfusion images in all regions, no evidence of inducible myocardial ischemia, no significant wall motion abnormalities, EF 70%.   CXR on 06/15/12 showed no active disease.  Labs noted.  Glucose is 69.  With history of unspecified hepatitis, will add HFP to labs done earlier today.  I've asked the Short Stay nursing staff to follow-up results.  Shonna Chock, PA-C 06/15/12 1645

## 2012-06-16 ENCOUNTER — Encounter (HOSPITAL_COMMUNITY): Payer: Self-pay | Admitting: Vascular Surgery

## 2012-06-16 ENCOUNTER — Encounter (HOSPITAL_COMMUNITY): Payer: Self-pay | Admitting: *Deleted

## 2012-06-16 ENCOUNTER — Encounter (HOSPITAL_COMMUNITY)
Admission: RE | Disposition: A | Discharge status: Home / Self Care | Payer: Self-pay | Source: Ambulatory Visit | Attending: General Surgery

## 2012-06-16 ENCOUNTER — Ambulatory Visit (HOSPITAL_COMMUNITY)
Admission: RE | Admit: 2012-06-16 | Discharge: 2012-06-16 | Disposition: A | Discharge status: Home / Self Care | Payer: Medicare Other | Source: Ambulatory Visit | Attending: General Surgery | Admitting: General Surgery

## 2012-06-16 ENCOUNTER — Ambulatory Visit (HOSPITAL_COMMUNITY): Payer: Medicare Other | Admitting: Vascular Surgery

## 2012-06-16 DIAGNOSIS — Z01812 Encounter for preprocedural laboratory examination: Secondary | ICD-10-CM | POA: Insufficient documentation

## 2012-06-16 DIAGNOSIS — Z01818 Encounter for other preprocedural examination: Secondary | ICD-10-CM | POA: Insufficient documentation

## 2012-06-16 DIAGNOSIS — J4489 Other specified chronic obstructive pulmonary disease: Secondary | ICD-10-CM | POA: Insufficient documentation

## 2012-06-16 DIAGNOSIS — M810 Age-related osteoporosis without current pathological fracture: Secondary | ICD-10-CM | POA: Insufficient documentation

## 2012-06-16 DIAGNOSIS — I10 Essential (primary) hypertension: Secondary | ICD-10-CM | POA: Insufficient documentation

## 2012-06-16 DIAGNOSIS — K4090 Unilateral inguinal hernia, without obstruction or gangrene, not specified as recurrent: Secondary | ICD-10-CM | POA: Insufficient documentation

## 2012-06-16 DIAGNOSIS — E785 Hyperlipidemia, unspecified: Secondary | ICD-10-CM | POA: Insufficient documentation

## 2012-06-16 DIAGNOSIS — R1031 Right lower quadrant pain: Secondary | ICD-10-CM

## 2012-06-16 DIAGNOSIS — J449 Chronic obstructive pulmonary disease, unspecified: Secondary | ICD-10-CM | POA: Insufficient documentation

## 2012-06-16 HISTORY — PX: INGUINAL HERNIA REPAIR: SHX194

## 2012-06-16 SURGERY — REPAIR, HERNIA, INGUINAL, ADULT
Anesthesia: General | Site: Groin | Laterality: Right | Wound class: Clean

## 2012-06-16 MED ORDER — BUPIVACAINE HCL (PF) 0.25 % IJ SOLN
INTRAMUSCULAR | Status: AC
  Filled 2012-06-16: qty 30

## 2012-06-16 MED ORDER — HYDROCODONE-ACETAMINOPHEN 5-325 MG PO TABS: 1.0000 | ORAL_TABLET | Freq: Four times a day (QID) | ORAL | Status: AC | PRN

## 2012-06-16 MED ORDER — FENTANYL CITRATE 0.05 MG/ML IJ SOLN
INTRAMUSCULAR | Status: DC | PRN
  Administered 2012-06-16 (×3): 25 ug via INTRAVENOUS

## 2012-06-16 MED ORDER — LACTATED RINGERS IV SOLN
INTRAVENOUS | Status: DC | PRN
  Administered 2012-06-16: 11:00:00 via INTRAVENOUS

## 2012-06-16 MED ORDER — PROPOFOL 10 MG/ML IV BOLUS
INTRAVENOUS | Status: DC | PRN
  Administered 2012-06-16: 200 mg via INTRAVENOUS

## 2012-06-16 MED ORDER — EPHEDRINE SULFATE 50 MG/ML IJ SOLN
INTRAMUSCULAR | Status: DC | PRN
  Administered 2012-06-16: 5 mg via INTRAVENOUS
  Administered 2012-06-16: 10 mg via INTRAVENOUS

## 2012-06-16 MED ORDER — LIDOCAINE-EPINEPHRINE 1 %-1:100000 IJ SOLN
INTRAMUSCULAR | Status: DC | PRN
  Administered 2012-06-16: 20 mL

## 2012-06-16 MED ORDER — CEFAZOLIN SODIUM-DEXTROSE 2-3 GM-% IV SOLR
INTRAVENOUS | Status: AC
  Filled 2012-06-16: qty 50

## 2012-06-16 MED ORDER — BUPIVACAINE HCL (PF) 0.25 % IJ SOLN
INTRAMUSCULAR | Status: DC | PRN
  Administered 2012-06-16: 20 mL

## 2012-06-16 MED ORDER — LIDOCAINE-EPINEPHRINE 1 %-1:100000 IJ SOLN
INTRAMUSCULAR | Status: AC
  Filled 2012-06-16: qty 1

## 2012-06-16 MED ORDER — 0.9 % SODIUM CHLORIDE (POUR BTL) OPTIME
TOPICAL | Status: DC | PRN
  Administered 2012-06-16: 1000 mL

## 2012-06-16 MED ORDER — MIDAZOLAM HCL 5 MG/5ML IJ SOLN
INTRAMUSCULAR | Status: DC | PRN
  Administered 2012-06-16: 2 mg via INTRAVENOUS

## 2012-06-16 MED ORDER — ONDANSETRON HCL 4 MG/2ML IJ SOLN
INTRAMUSCULAR | Status: DC | PRN
  Administered 2012-06-16: 4 mg via INTRAVENOUS

## 2012-06-16 SURGICAL SUPPLY — 45 items
BLADE SURG 10 STRL SS (BLADE) ×2 IMPLANT
BLADE SURG 15 STRL LF DISP TIS (BLADE) ×1 IMPLANT
BLADE SURG 15 STRL SS (BLADE) ×1
BLADE SURG ROTATE 9660 (MISCELLANEOUS) ×2 IMPLANT
CANISTER SUCTION 2500CC (MISCELLANEOUS) IMPLANT
CHLORAPREP W/TINT 26ML (MISCELLANEOUS) ×2 IMPLANT
CLOTH BEACON ORANGE TIMEOUT ST (SAFETY) ×2 IMPLANT
COVER SURGICAL LIGHT HANDLE (MISCELLANEOUS) ×2 IMPLANT
DECANTER SPIKE VIAL GLASS SM (MISCELLANEOUS) IMPLANT
DERMABOND ADVANCED (GAUZE/BANDAGES/DRESSINGS) ×1
DERMABOND ADVANCED .7 DNX12 (GAUZE/BANDAGES/DRESSINGS) ×1 IMPLANT
DRAIN PENROSE 1/2X12 LTX STRL (WOUND CARE) IMPLANT
DRAPE LAPAROSCOPIC ABDOMINAL (DRAPES) ×2 IMPLANT
ELECT CAUTERY BLADE 6.4 (BLADE) ×2 IMPLANT
ELECT REM PT RETURN 9FT ADLT (ELECTROSURGICAL) ×2
ELECTRODE REM PT RTRN 9FT ADLT (ELECTROSURGICAL) ×1 IMPLANT
GLOVE BIOGEL PI IND STRL 7.5 (GLOVE) ×2 IMPLANT
GLOVE BIOGEL PI INDICATOR 7.5 (GLOVE) ×2
GLOVE SURG SS PI 7.5 STRL IVOR (GLOVE) ×6 IMPLANT
GOWN PREVENTION PLUS XLARGE (GOWN DISPOSABLE) ×2 IMPLANT
GOWN STRL NON-REIN LRG LVL3 (GOWN DISPOSABLE) ×2 IMPLANT
KIT BASIN OR (CUSTOM PROCEDURE TRAY) ×2 IMPLANT
KIT ROOM TURNOVER OR (KITS) ×2 IMPLANT
MESH PARIETEX PROGRIP RIGHT (Mesh General) ×2 IMPLANT
NEEDLE HYPO 25GX1X1/2 BEV (NEEDLE) ×2 IMPLANT
NS IRRIG 1000ML POUR BTL (IV SOLUTION) ×2 IMPLANT
PACK SURGICAL SETUP 50X90 (CUSTOM PROCEDURE TRAY) ×2 IMPLANT
PAD ARMBOARD 7.5X6 YLW CONV (MISCELLANEOUS) ×2 IMPLANT
PENCIL BUTTON HOLSTER BLD 10FT (ELECTRODE) ×2 IMPLANT
SPECIMEN JAR SMALL (MISCELLANEOUS) IMPLANT
SPONGE INTESTINAL PEANUT (DISPOSABLE) ×2 IMPLANT
SPONGE LAP 18X18 X RAY DECT (DISPOSABLE) ×2 IMPLANT
SUT MNCRL AB 4-0 PS2 18 (SUTURE) ×2 IMPLANT
SUT PROLENE 2 0 SH DA (SUTURE) ×8 IMPLANT
SUT VIC AB 2-0 SH 27 (SUTURE) ×4
SUT VIC AB 2-0 SH 27XBRD (SUTURE) ×4 IMPLANT
SUT VIC AB 3-0 SH 27 (SUTURE) ×1
SUT VIC AB 3-0 SH 27X BRD (SUTURE) ×1 IMPLANT
SUT VICRYL AB 2 0 TIES (SUTURE) ×2 IMPLANT
SYR BULB 3OZ (MISCELLANEOUS) ×2 IMPLANT
SYR CONTROL 10ML LL (SYRINGE) ×2 IMPLANT
TOWEL OR 17X24 6PK STRL BLUE (TOWEL DISPOSABLE) ×2 IMPLANT
TOWEL OR 17X26 10 PK STRL BLUE (TOWEL DISPOSABLE) ×2 IMPLANT
TUBE CONNECTING 12X1/4 (SUCTIONS) IMPLANT
YANKAUER SUCT BULB TIP NO VENT (SUCTIONS) IMPLANT

## 2012-06-16 NOTE — Anesthesia Procedure Notes (Signed)
Procedure Name: LMA Insertion Date/Time: 06/16/2012 11:13 AM Performed by: Margaree Mackintosh Pre-anesthesia Checklist: Patient identified, Timeout performed, Emergency Drugs available, Suction available and Patient being monitored Patient Re-evaluated:Patient Re-evaluated prior to inductionOxygen Delivery Method: Circle system utilized Preoxygenation: Pre-oxygenation with 100% oxygen Intubation Type: IV induction LMA: LMA inserted LMA Size: 4.0 Number of attempts: 1 Placement Confirmation: positive ETCO2 and breath sounds checked- equal and bilateral Tube secured with: Tape Dental Injury: Teeth and Oropharynx as per pre-operative assessment

## 2012-06-16 NOTE — Op Note (Signed)
NAMEDanah, Reinecke Lahna                ACCOUNT NO.:  [id number]  MEDICAL RECORD NO.:  [id number]  LOCATION:  [facility]                         FACILITY:  [facility]  PHYSICIAN:  Lodema Pilot, MD       DATE OF BIRTH:  14-Aug-1930  DATE OF PROCEDURE:  06/16/2012 DATE OF DISCHARGE:  06/16/2012                              OPERATIVE REPORT   PREOPERATIVE DIAGNOSIS:  Right inguinal hernia.  POSTOPERATIVE DIAGNOSIS:  Right inguinal hernia.  PROCEDURE:  Open repair of right inguinal hernia with mesh.  SURGEON:  Lodema Pilot, MD  ASSISTANT:  None.  ANESTHESIA:  General LMA anesthesia with 30 mL of 1% lidocaine with epinephrine and 0.25% Marcaine, a 50:50 mixture.  FLUIDS:  700 mL of crystalloid.  ESTIMATED BLOOD LOSS:  Minimal.  DRAINS:  None.  SPECIMENS:  None.  COMPLICATIONS:  None apparent.  FINDINGS:  Indirect sac containing right inguinal hernia.  Division of the round ligament with placement of ProGrip mesh.  INDICATION FOR PROCEDURE:  Ms. Menden is an 78 year old female with persistent right inguinal discomfort.  She has fat-containing hernia on ultrasound and CT scan and given her right inguinal pain and CT scan findings consistent with inguinal hernia, we offered her surgical repair.  OPERATIVE DETAILS:  Ms. Raburn was seen and evaluated in the preoperative area.  Risks and benefits of procedure were discussed in lay terms.  Informed consent was obtained.  A surgical site was marked prior to anesthetic administration.  She was given prophylactic antibiotics and taken to the operating room, placed on table in supine position.  General LMA anesthesia was obtained and her right groin and abdomen prepped and draped in a standard surgical fashion.  Procedure time-out was performed with all operative team members to confirm proper patient, procedure.  A prior lower transverse scar was used and dissection carried down into the subcutaneous tissue using Bovie electrocautery.   External oblique fascia was identified and sharply incised along the length of its fibers immediately underlying the external oblique fascia in which she noted to have a fat-containing inguinal hernia with indirect hernia coming out just medial to the round ligament.  I dissected the round ligament, ilioinguinal nerve with my fingers and because of the amount of pain she is having, I divided the round ligament and ilioinguinal nerve between 2-0 Vicryl stick ties. Then I excised some of preperitoneal fat prolapsing through the hernia defect, then reduced the remainder of the fat and closed the defect with 2-0 Vicryl sutures.  Then the wound was noted to be hemostatic and I placed a ProGrip mesh in the wound and fastened to the abdominal wall, sutured in place at the pubic tubercle and a 2-0 Prolene suture was run along the shelving edge of the inguinal ligament lateral to hernia defect.  Then I had to trim some of the cephalad aspect of the mesh to fit under the external oblique fascia and the tails of the ProGrip mesh were sutured together to make a solid piece of mesh and multiple interrupted 2-0 Prolene sutures were used laterally and cephalad and medial to further secure the mesh.  The mesh appeared to cover widely to the defect, both the  direct and indirect hernia defects and the wound was irrigated with sterile saline solution and noted to be hemostatic. I injected 30 mL of 1% lidocaine with epinephrine and 0.25% Marcaine in a 50:50 mixture and there was no evidence of incisional hernia from her prior incision.  The external oblique fascia was then approximated with a running 2-0 Vicryl suture, and the wound was again irrigated with sterile saline solution.  Noted to be hemostatic.  Scarpa fascia was approximated with 3-0 Vicryl suture and the skin edges were approximated with 4-0 Monocryl subcuticular suture.  Skin was washed and dried and Dermabond was applied.  All sponge, needle,  instrument counts were correct at the end of the case.  The patient tolerated the procedure well without apparent complications.          ______________________________ Lodema Pilot, MD     BL/MEDQ  D:  06/16/2012  T:  06/16/2012  Job:  [id number]

## 2012-06-16 NOTE — Interval H&P Note (Signed)
History and Physical Interval Note:  06/16/2012 10:36 AM  Caitlyn [name]  has presented today for surgery, with the diagnosis of to repair defect in abdominal wall  The various methods of treatment have been discussed with the patient and family. After consideration of risks, benefits and other options for treatment, the patient has consented to  Procedure(s) (LRB): HERNIA REPAIR INGUINAL ADULT (Right) INSERTION OF MESH (N/A) as a surgical intervention .  The patient's history has been reviewed, patient examined, no change in status, stable for surgery.  I have reviewed the patient's chart and labs.  Questions were answered to the patient's satisfaction.  CT scan concerning for RIH.  I do not appreciate any hernia on exam but given CT and [place] and her pain, we will repair hernia in hopes that this will relieve her symptoms. I discussed the risks of infection, bleeding, pain, scarring, persistent pain postop or nonrelief of her symptoms, numbness and nerve injury and recurrence and she expressed understanding and desires to proceed with [facility] with possible mesh. Site marked with the patient.   Lodema Pilot DAVID

## 2012-06-16 NOTE — Transfer of Care (Signed)
Immediate Anesthesia Transfer of Care Note  Patient: Caitlyn [name]  Procedure(s) Performed: Procedure(s) (LRB): HERNIA REPAIR INGUINAL ADULT (Right) INSERTION OF MESH (Right)  Patient Location: PACU  Anesthesia Type: General  Level of Consciousness: awake, alert  and oriented  Airway & Oxygen Therapy: Patient Spontanous Breathing and Patient connected to nasal cannula oxygen  Post-op Assessment: Report given to PACU RN and Post -op Vital signs reviewed and stable  Post vital signs: Reviewed and stable  Complications: No apparent anesthesia complications

## 2012-06-16 NOTE — Anesthesia Postprocedure Evaluation (Signed)
  Anesthesia Post-op Note  Patient: Caitlyn [name]  Procedure(s) Performed: Procedure(s) (LRB): HERNIA REPAIR INGUINAL ADULT (Right) INSERTION OF MESH (Right)  Patient Location: PACU  Anesthesia Type: General  Level of Consciousness: awake, alert  and oriented  Airway and Oxygen Therapy: Patient Spontanous Breathing and Patient connected to nasal cannula oxygen  Post-op Pain: none  Post-op Assessment: Post-op Vital signs reviewed, Patient's Cardiovascular Status Stable, Respiratory Function Stable, Patent Airway and No signs of Nausea or vomiting  Post-op Vital Signs: Reviewed and stable  Complications: No apparent anesthesia complications

## 2012-06-16 NOTE — Anesthesia Preprocedure Evaluation (Addendum)
Anesthesia Evaluation  Patient identified by MRN, date of birth, ID band Patient awake    Reviewed: Allergy & Precautions, H&P , NPO status , Patient's Chart, lab work & pertinent test results  Airway Mallampati: III TM Distance: >3 FB Neck ROM: Limited    Dental No notable dental hx. (+) Teeth Intact and Dental Advisory Given   Pulmonary sleep apnea ,  breath sounds clear to auscultation  Pulmonary exam normal       Cardiovascular hypertension, On Home Beta Blockers + Valvular Problems/Murmurs MVP Rhythm:Regular Rate:Normal     Neuro/Psych negative neurological ROS  negative psych ROS   GI/Hepatic GERD-  Controlled,(+) Hepatitis -  Endo/Other  negative endocrine ROS  Renal/GU negative Renal ROS  negative genitourinary   Musculoskeletal   Abdominal   Peds  Hematology negative hematology ROS (+)   Anesthesia Other Findings   Reproductive/Obstetrics negative OB ROS                           Anesthesia Physical Anesthesia Plan  ASA: II  Anesthesia Plan: General   Post-op Pain Management:    Induction: Intravenous  Airway Management Planned: LMA  Additional Equipment:   Intra-op Plan:   Post-operative Plan: Extubation in OR  Informed Consent: I have reviewed the patients History and Physical, chart, labs and discussed the procedure including the risks, benefits and alternatives for the proposed anesthesia with the patient or authorized representative who has indicated his/her understanding and acceptance.   Dental advisory given  Plan Discussed with: CRNA  Anesthesia Plan Comments:         Anesthesia Quick Evaluation

## 2012-06-16 NOTE — Preoperative (Signed)
Beta Blockers   Reason not to administer Beta Blockers:Not Applicable, took Metoprolol this am 

## 2012-06-16 NOTE — Brief Op Note (Signed)
06/16/2012  12:18 PM  PATIENT:  Caitlyn [name]  78 y.o. female  PRE-OPERATIVE DIAGNOSIS:  to repair defect in abdominal wall  POST-OPERATIVE DIAGNOSIS:  to repair defect in abdominal wall  PROCEDURE:  Procedure(s) (LRB): HERNIA REPAIR INGUINAL ADULT (Right) INSERTION OF MESH (Right)  SURGEON:  Surgeon(s) and Role:    * Lodema Pilot, DO - Primary  PHYSICIAN ASSISTANT:   ASSISTANTS: none   ANESTHESIA:   general  EBL:  Total I/O In: 700 [I.V.:700] Out: 25 [Blood:25]  BLOOD ADMINISTERED:none  DRAINS: none   LOCAL MEDICATIONS USED:  MARCAINE    and LIDOCAINE   SPECIMEN:  No Specimen  DISPOSITION OF SPECIMEN:  PATHOLOGY  COUNTS:  YES  TOURNIQUET:  * No tourniquets in log *  DICTATION: .Other Dictation: Dictation Number [id number]  PLAN OF CARE: Discharge to home after PACU  PATIENT DISPOSITION:  PACU - hemodynamically stable.   Delay start of Pharmacological VTE agent (>24hrs) due to surgical blood loss or risk of bleeding: no

## 2012-06-16 NOTE — H&P (View-Only) (Signed)
Patient ID: Caitlyn [name], female   DOB: 04/24/1930, 78 y.o.   MRN: [id number]  No chief complaint on file.   Caitlyn [name] is a 78 y.o. female.  Caitlyn This patient was referred by Dr. Clarene Duke for evaluation of right groin pain which has been present for about 4-5 weeks. She says that this began after severe coughing episode. Since then she has had pain in her right lower quadrant and right groin area which is worse with sitting or bending and relieved by straining up or lying flat and taking Advil. She describes as a sharp pains and has some chronic constipation but no nausea and vomiting. She denies any blood in the stools or melena. She is current on her colonoscopy. She had an ultrasound which was concerning for a possible inguinal hernia but she has not noticed any bulge in that area.  Past Medical History  Diagnosis Date  . Anemia   . Blood transfusion   . Hyperlipidemia   . Hypertension   . Osteoporosis   . COPD (chronic obstructive pulmonary disease)   . AC (acromioclavicular) joint bone spurs 2008    Back    Past Surgical History  Procedure Date  . Spine surgery   . Anterior cervical decomp/discectomy fusion   . Hemorrhoid surgery 1969, 1971  . Knee arthroscopy 2007 & 2008  . Tongue biopsy 2000  . Abdominal hysterectomy 1964    No family history on file.  Social History History  Substance Use Topics  . Smoking status: Never Smoker   . Smokeless tobacco: Not on file  . Alcohol Use: Yes    Allergies  Allergen Reactions  . Statins     Patient indicated she was not able to tolerate medications containing Statins     Current Outpatient Prescriptions  Medication Sig Dispense Refill  . alendronate (FOSAMAX) 70 MG tablet       . aspirin 81 MG tablet Take 81 mg by mouth daily.      . Ibuprofen (ADVIL PO) Take by mouth.      . metoprolol tartrate (LOPRESSOR) 25 MG tablet Take 25 mg by mouth 2 (two) times daily.      . Pitavastatin Calcium (LIVALO PO) Take by  mouth.      . Red Yeast Rice Extract (RED YEAST RICE PO) Take by mouth.      Marland Kitchen azithromycin (ZITHROMAX) 250 MG tablet       . BOOSTRIX 5-2.5-18.5 injection         Review of Systems Review of Systems All other review of systems negative or noncontributory except as stated in the Caitlyn  Blood pressure 124/82, pulse 71, temperature 97.6 F (36.4 C), temperature source Temporal, resp. rate 16, height 5' 6.5" (1.689 m), weight 159 lb 12.8 oz (72.485 kg).  Physical Exam Physical Exam Physical Exam  Nursing note and vitals reviewed. Constitutional: She is oriented to person, place, and time. She appears well-developed and well-nourished. No distress.  HENT:  Head: Normocephalic and atraumatic.  Mouth/Throat: No oropharyngeal exudate.  Eyes: Conjunctivae and EOM are normal. Pupils are equal, round, and reactive to light. Right eye exhibits no discharge. Left eye exhibits no discharge. No scleral icterus.  Neck: Normal range of motion. Neck supple. No tracheal deviation present.  Cardiovascular: Normal rate, regular rhythm, normal heart sounds and intact distal pulses.   Pulmonary/Chest: Effort normal and breath sounds normal. No stridor. No respiratory distress. She has no wheezes.  Abdominal: Soft. Bowel sounds are normal.  She exhibits no distension and no mass. There is no tenderness. There is no rebound and no guarding. No bulge with valsalva near lateral incision or inguinal region. Musculoskeletal: Normal range of motion. She exhibits no edema and no tenderness.  Neurological: She is alert and oriented to person, place, and time.  Skin: Skin is warm and dry. No rash noted. She is not diaphoretic. No erythema. No pallor.  Psychiatric: She has a normal mood and affect. Her behavior is normal. Judgment and thought content normal.    Data Reviewed [place]  Assessment    Right-sided groin pain She has a history which is concerning for a possible right inguinal hernia but I do not appreciate  any hernia with Valsalva on exam at either her prior C-section incision or in the inguinal region. She had an ultrasound which was suspicious for a possible fat-containing hernia but again, I do not appreciate one on exam. This could be a muscle strain from her coughing episodes or it could be a hernia. We will check a CT scan of the pelvis for further evaluation. She will see me back after her CT    Plan    If her CT demonstrates evidence of a hernia, we will discuss surgical options. If it is negative, we will continue with watchful waiting and observation.       Lodema Pilot DAVID 06/04/2012, 1:53 PM

## 2012-06-17 ENCOUNTER — Telehealth (INDEPENDENT_AMBULATORY_CARE_PROVIDER_SITE_OTHER): Payer: Self-pay

## 2012-06-17 ENCOUNTER — Encounter (INDEPENDENT_AMBULATORY_CARE_PROVIDER_SITE_OTHER): Payer: Medicare Other | Admitting: General Surgery

## 2012-06-17 NOTE — Telephone Encounter (Signed)
Pt home doing well. PO appt made. 

## 2012-06-17 NOTE — Progress Notes (Signed)
Patient reports nausea last night and vomiting last night. Patient reports that she ate ice cream last night and took metamucil to keep BM soft along with prescribed pain pill. Discussed that nausea could be coming from surgery, anesthesia, or pain medication. Advised patient to go to clear liquid diet until nausea resolves, use previous dose of home advil until nausea resolves or it does not take care of pain, and to contact Dr. Delice Lesch office for persistent n/v after 12 noon today. Discussed clear liquid diet and how to progress to regular diet. Discussed that if she needed to take narcotic pain pill to eat crackers with pill. Also, discussed that once on regular diet to eat with pain pill. Patient verbalized understanding.

## 2012-06-18 ENCOUNTER — Encounter (HOSPITAL_COMMUNITY): Payer: Self-pay | Admitting: General Surgery

## 2012-06-25 ENCOUNTER — Encounter (INDEPENDENT_AMBULATORY_CARE_PROVIDER_SITE_OTHER): Payer: Medicare Other | Admitting: General Surgery

## 2012-06-29 ENCOUNTER — Ambulatory Visit (INDEPENDENT_AMBULATORY_CARE_PROVIDER_SITE_OTHER): Payer: Medicare Other | Admitting: Surgery

## 2012-06-30 ENCOUNTER — Encounter (INDEPENDENT_AMBULATORY_CARE_PROVIDER_SITE_OTHER): Payer: Self-pay | Admitting: General Surgery

## 2012-06-30 ENCOUNTER — Other Ambulatory Visit (HOSPITAL_COMMUNITY): Payer: Self-pay | Admitting: Family Medicine

## 2012-06-30 ENCOUNTER — Ambulatory Visit (INDEPENDENT_AMBULATORY_CARE_PROVIDER_SITE_OTHER): Payer: Medicare Other | Admitting: General Surgery

## 2012-06-30 VITALS — BP 142/84 | HR 83 | Temp 97.2°F | Ht 67.0 in | Wt 158.2 lb

## 2012-06-30 DIAGNOSIS — Z1231 Encounter for screening mammogram for malignant neoplasm of breast: Secondary | ICD-10-CM

## 2012-06-30 DIAGNOSIS — Z4889 Encounter for other specified surgical aftercare: Secondary | ICD-10-CM

## 2012-06-30 DIAGNOSIS — Z5189 Encounter for other specified aftercare: Secondary | ICD-10-CM

## 2012-06-30 NOTE — Progress Notes (Signed)
Subjective:     Patient ID: Caitlyn [name], female   DOB: 12/17/1929, 78 y.o.   MRN: [id number]  HPI  This patient follows up 2 weeks status post open repair of right inguinal hernia with mesh. She says that she feels much better than before her surgery but she still has some right lower quadrant and right groin pain. She's not taking any pain medications and her bowels are functioning normally. She is tolerating regular diet. She has no complaints other than some of the complaints that she had preoperatively which led to hernia surgery. Review of Systems     Objective:   Physical Exam No acute distress and nontoxic-appearing Her incision is healing well without sign of infection. There is no evidence of recurrent hernia and no evidence of any postoperative complication.    Assessment:     Status post open right inguinal hernia repair with mesh She still has some discomfort in her right lower quadrant and right groin which is similar to preop. However, she does say that she feels better than before her procedure so some of her symptoms may have been attributed to this hernia. There is no evidence of any postoperative complications or recurrence. I am not certain if all of her symptoms will continue to improve or if something may be due to other cause such as a possible intra-abdominal cause, hip or back pain or other orthopedic cause. Only time will tell if after she continues to recover from her surgery if all of her symptoms were indeed do to her hernia.    Plan:     She seemed to be doing okay and she can gradually increase her activity as tolerated. She can follow up with me on a when necessary basis

## 2012-08-04 ENCOUNTER — Ambulatory Visit (HOSPITAL_COMMUNITY): Payer: Medicare Other

## 2012-08-16 ENCOUNTER — Ambulatory Visit (HOSPITAL_COMMUNITY)
Admission: RE | Admit: 2012-08-16 | Discharge: 2012-08-16 | Disposition: A | Discharge status: Home / Self Care | Payer: Medicare Other | Source: Ambulatory Visit | Attending: Family Medicine | Admitting: Family Medicine

## 2012-08-16 ENCOUNTER — Telehealth (INDEPENDENT_AMBULATORY_CARE_PROVIDER_SITE_OTHER): Payer: Self-pay | Admitting: General Surgery

## 2012-08-16 DIAGNOSIS — Z1231 Encounter for screening mammogram for malignant neoplasm of breast: Secondary | ICD-10-CM | POA: Insufficient documentation

## 2012-08-16 NOTE — Telephone Encounter (Signed)
Patient calling in status post right inguinal hernia repair 06/16/2012 still complaining of groin pain. She states it feels like a constant pressure in right groin with intermittent sharp pains. Pain does not travel anywhere but gets aggravated by her back pain. It also feels worse when she takes deep breaths and if she is constipated or sits too long. She is moving her bowels. Eating okay. No vomiting, some nausea but she attributes this to her cholesterol medication. Please advise.

## 2012-08-23 ENCOUNTER — Telehealth (INDEPENDENT_AMBULATORY_CARE_PROVIDER_SITE_OTHER): Payer: Self-pay
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
## 2012-08-24 ENCOUNTER — Encounter (INDEPENDENT_AMBULATORY_CARE_PROVIDER_SITE_OTHER): Payer: Medicare Other | Admitting: General Surgery

## 2012-08-26 ENCOUNTER — Encounter (INDEPENDENT_AMBULATORY_CARE_PROVIDER_SITE_OTHER): Payer: Medicare Other | Admitting: General Surgery

## 2012-09-16 ENCOUNTER — Encounter (INDEPENDENT_AMBULATORY_CARE_PROVIDER_SITE_OTHER): Payer: Self-pay | Admitting: General Surgery

## 2012-09-16 ENCOUNTER — Ambulatory Visit (INDEPENDENT_AMBULATORY_CARE_PROVIDER_SITE_OTHER): Payer: Medicare Other | Admitting: General Surgery

## 2012-09-16 VITALS — BP 134/72 | HR 76 | Temp 97.4°F | Resp 16 | Ht 67.0 in | Wt 158.8 lb

## 2012-09-16 DIAGNOSIS — Z4889 Encounter for other specified surgical aftercare: Secondary | ICD-10-CM

## 2012-09-16 DIAGNOSIS — Z5189 Encounter for other specified aftercare: Secondary | ICD-10-CM
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
## 2012-10-06 ENCOUNTER — Telehealth (INDEPENDENT_AMBULATORY_CARE_PROVIDER_SITE_OTHER): Payer: Self-pay | Admitting: General Surgery

## 2012-10-06 NOTE — Telephone Encounter (Signed)
Pt called to alert Dr. Biagio Quint she is having increased pain in the groin and it is impacting her having bowel movements.  She states Dr. Biagio Quint had planned to do a "test" (CT by note) if she continued to have pain.  Please advise.

## 2012-10-12 ENCOUNTER — Telehealth (INDEPENDENT_AMBULATORY_CARE_PROVIDER_SITE_OTHER): Payer: Self-pay

## 2012-10-12 NOTE — Telephone Encounter (Signed)
Return call to patient.  Patient reports having an increase in pain after eating lunch.  Patient states "she feel's like she has to go to the bathroom constantly"  Patient would like to proceed with CT of abdomen.  Patient would like to go next week (10/18/12 - 10/22/12) patient aware this will be reviewed with Dr. Biagio Quint and I will call after to lunch to discuss with patient further.

## 2012-10-13 ENCOUNTER — Telehealth (INDEPENDENT_AMBULATORY_CARE_PROVIDER_SITE_OTHER): Payer: Self-pay

## 2012-10-13 ENCOUNTER — Other Ambulatory Visit (INDEPENDENT_AMBULATORY_CARE_PROVIDER_SITE_OTHER): Payer: Self-pay

## 2012-10-13 DIAGNOSIS — K4091 Unilateral inguinal hernia, without obstruction or gangrene, recurrent: Secondary | ICD-10-CM

## 2012-10-13 NOTE — Telephone Encounter (Signed)
Okay to order CT Abd for patient to Evaluate for Possible Recurrent Hernia.  Will call [facility] and schedule.

## 2012-10-14 ENCOUNTER — Telehealth (INDEPENDENT_AMBULATORY_CARE_PROVIDER_SITE_OTHER): Payer: Self-pay

## 2012-10-14 NOTE — Telephone Encounter (Signed)
ABD CT scheduled for 10/19/12 @ 2:00, [facility] [address], Patient aware and will pick up contrast from the same location as test.

## 2012-10-19 ENCOUNTER — Ambulatory Visit
Admission: RE | Admit: 2012-10-19 | Discharge: 2012-10-19 | Disposition: A | Discharge status: Home / Self Care | Payer: Medicare Other | Source: Ambulatory Visit | Attending: General Surgery | Admitting: General Surgery

## 2012-10-19 DIAGNOSIS — K4091 Unilateral inguinal hernia, without obstruction or gangrene, recurrent: Secondary | ICD-10-CM

## 2012-10-19 MED ORDER — IOHEXOL 300 MG/ML  SOLN
100.0000 mL | Freq: Once | INTRAMUSCULAR | Status: AC | PRN
  Administered 2012-10-19: 100 mL via INTRAVENOUS

## 2012-10-21 ENCOUNTER — Telehealth (INDEPENDENT_AMBULATORY_CARE_PROVIDER_SITE_OTHER): Payer: Self-pay

## 2012-10-21 ENCOUNTER — Telehealth (INDEPENDENT_AMBULATORY_CARE_PROVIDER_SITE_OTHER): Payer: Self-pay | Admitting: General Surgery

## 2012-10-21 NOTE — Telephone Encounter (Signed)
Patient returning call to our office.  CT results given to patient (No evidence of recurrent hernia)

## 2012-10-21 NOTE — Telephone Encounter (Signed)
Patient called and wanted her CT faxed to Dr Catha Gosselin

## 2012-10-21 NOTE — Telephone Encounter (Signed)
Called patient to discuss CT results.  Patient not available, patient spouse will have Caitlyn [name] call our office when she returns home.  (patient CT shows no recurrent hernia)

## 2012-10-26 ENCOUNTER — Encounter (INDEPENDENT_AMBULATORY_CARE_PROVIDER_SITE_OTHER): Payer: Self-pay

## 2012-12-07 ENCOUNTER — Ambulatory Visit
Admission: RE | Admit: 2012-12-07 | Discharge: 2012-12-07 | Disposition: A | Discharge status: Home / Self Care | Payer: Medicare Other | Source: Ambulatory Visit | Attending: Family Medicine | Admitting: Family Medicine

## 2012-12-07 ENCOUNTER — Other Ambulatory Visit: Payer: Self-pay | Admitting: Family Medicine

## 2012-12-07 DIAGNOSIS — M79606 Pain in leg, unspecified: Secondary | ICD-10-CM

## 2013-06-29 ENCOUNTER — Telehealth: Payer: Self-pay | Admitting: Cardiovascular Disease

## 2013-06-29 NOTE — Telephone Encounter (Signed)
Please call-she need to ask you a couple of questions.Pt did not say what the questions were.

## 2013-06-29 NOTE — Telephone Encounter (Signed)
Returned call.  Pt wanted to talk to JC, LPN, who is available and will speak w/ pt.  Message forwarded to [facility]. Berlinda Last, LPN.

## 2013-06-29 NOTE — Telephone Encounter (Signed)
spoke to pt. At length about her meds ,pt. stated dr. Clarene Duke wanted to take her off atorvastatin and give her a holiday and try her on a round of gabapentin, also stop the losartan and begin HCTZ

## 2013-07-01 ENCOUNTER — Other Ambulatory Visit: Payer: Self-pay | Admitting: Orthopaedic Surgery

## 2013-07-01 DIAGNOSIS — M810 Age-related osteoporosis without current pathological fracture: Secondary | ICD-10-CM

## 2013-07-18 ENCOUNTER — Ambulatory Visit
Admission: RE | Admit: 2013-07-18 | Discharge: 2013-07-18 | Disposition: A | Discharge status: Home / Self Care | Payer: Medicare Other | Source: Ambulatory Visit | Attending: Orthopaedic Surgery | Admitting: Orthopaedic Surgery

## 2013-07-18 DIAGNOSIS — M810 Age-related osteoporosis without current pathological fracture: Secondary | ICD-10-CM

## 2013-08-03 ENCOUNTER — Telehealth: Payer: Self-pay | Admitting: Cardiovascular Disease

## 2013-08-03 NOTE — Telephone Encounter (Signed)
Please call.

## 2013-08-04 NOTE — Telephone Encounter (Signed)
Talked to pt. About their next appt and they stated  They wanted to see Dr. Royann Shivers

## 2013-08-23 ENCOUNTER — Other Ambulatory Visit (HOSPITAL_COMMUNITY): Payer: Self-pay | Admitting: Family Medicine

## 2013-08-23 DIAGNOSIS — Z1231 Encounter for screening mammogram for malignant neoplasm of breast: Secondary | ICD-10-CM

## 2013-09-26 ENCOUNTER — Ambulatory Visit (HOSPITAL_COMMUNITY): Payer: Medicare Other

## 2013-09-26 ENCOUNTER — Telehealth: Payer: Self-pay | Admitting: Cardiovascular Disease

## 2013-09-26 DIAGNOSIS — E782 Mixed hyperlipidemia: Secondary | ICD-10-CM

## 2013-09-26 DIAGNOSIS — R5381 Other malaise: Secondary | ICD-10-CM

## 2013-09-26 DIAGNOSIS — Z79899 Other long term (current) drug therapy: Secondary | ICD-10-CM

## 2013-09-26 NOTE — Telephone Encounter (Signed)
Would you please mail her lab order, she wants her lab work before she see Dr C..She has an appt with Dr C on 10-13-13 please.

## 2013-09-26 NOTE — Telephone Encounter (Signed)
Forwarded to B. Lassiter, CMA as this is a new pt for Dr. Royann Shivers from Dr. Alanda Amass.

## 2013-09-27 NOTE — Telephone Encounter (Signed)
Lab order mailed to patient.

## 2013-10-10 ENCOUNTER — Other Ambulatory Visit: Payer: Self-pay

## 2013-10-10 MED ORDER — ATORVASTATIN CALCIUM 20 MG PO TABS: 20.0000 mg | ORAL_TABLET | Freq: Every day | ORAL | Status: DC

## 2013-10-10 NOTE — Telephone Encounter (Signed)
Rx was sent to pharmacy electronically. 

## 2013-10-11 LAB — COMPREHENSIVE METABOLIC PANEL
ALT: 17 U/L (ref 0–35)
AST: 20 U/L (ref 0–37)
Alkaline Phosphatase: 78 U/L (ref 39–117)
Creat: 0.69 mg/dL (ref 0.50–1.10)
Total Bilirubin: 1.2 mg/dL (ref 0.3–1.2)

## 2013-10-11 LAB — LIPID PANEL
HDL: 81 mg/dL (ref >39)
LDL Cholesterol: 80 mg/dL (ref 0–99)
Total CHOL/HDL Ratio: 2.2 Ratio
Triglycerides: 77 mg/dL (ref <150)
VLDL: 15 mg/dL (ref 0–40)

## 2013-10-13 ENCOUNTER — Telehealth: Payer: Self-pay | Admitting: Cardiovascular Disease

## 2013-10-13 ENCOUNTER — Encounter: Payer: Self-pay | Admitting: Cardiovascular Disease

## 2013-10-13 ENCOUNTER — Ambulatory Visit (INDEPENDENT_AMBULATORY_CARE_PROVIDER_SITE_OTHER): Payer: Medicare Other | Admitting: Cardiovascular Disease

## 2013-10-13 VITALS — BP 137/74 | HR 85 | Resp 20 | Ht 66.0 in | Wt 158.6 lb

## 2013-10-13 DIAGNOSIS — I341 Nonrheumatic mitral (valve) prolapse: Secondary | ICD-10-CM

## 2013-10-13 DIAGNOSIS — E785 Hyperlipidemia, unspecified: Secondary | ICD-10-CM

## 2013-10-13 DIAGNOSIS — I059 Rheumatic mitral valve disease, unspecified: Secondary | ICD-10-CM

## 2013-10-13 DIAGNOSIS — I10 Essential (primary) hypertension: Secondary | ICD-10-CM

## 2013-10-13 NOTE — Telephone Encounter (Signed)
States she is not taking metoprolol or omega 3.  Both of these meds were taken off her med list.

## 2013-10-13 NOTE — Telephone Encounter (Signed)
Returning your call. °

## 2013-10-13 NOTE — Patient Instructions (Signed)
Stop Red Yeast Rice and Vitamin B12.  Your physician recommends that you schedule a follow-up appointment in: 6 months

## 2013-10-16 ENCOUNTER — Encounter: Payer: Self-pay | Admitting: Cardiovascular Disease

## 2013-10-16 DIAGNOSIS — I341 Nonrheumatic mitral (valve) prolapse: Secondary | ICD-10-CM

## 2013-10-16 DIAGNOSIS — I10 Essential (primary) hypertension: Secondary | ICD-10-CM | POA: Insufficient documentation

## 2013-10-16 DIAGNOSIS — E785 Hyperlipidemia, unspecified: Secondary | ICD-10-CM | POA: Insufficient documentation

## 2013-10-16 HISTORY — DX: Nonrheumatic mitral (valve) prolapse: I34.1

## 2013-10-16 NOTE — Assessment & Plan Note (Signed)
Currently asymptomatic. She did have palpitations in the past. Her last echocardiogram showed prolapse of the posterior leaflet with mild to moderate mitral insufficiency and a dilated left atrium, but normal left ventricular size and systolic function. No dedicated evaluation of therapy appears to be necessary at this time.

## 2013-10-16 NOTE — Assessment & Plan Note (Signed)
Excellent control of her blood pressure without serious side effects. No changes are made her medications

## 2013-10-16 NOTE — Assessment & Plan Note (Signed)
Since she is tolerating atorvastatin well I see no reason at that she should continue taking red yeast rice. Her lipid profile shows values in the optimal range.
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
## 2013-10-17 ENCOUNTER — Encounter: Payer: Self-pay | Admitting: Cardiovascular Disease

## 2014-04-14 ENCOUNTER — Other Ambulatory Visit: Payer: Self-pay | Admitting: Cardiovascular Disease

## 2014-04-17 NOTE — Telephone Encounter (Signed)
Rx was sent to pharmacy electronically. 

## 2014-05-19 ENCOUNTER — Encounter: Payer: Self-pay | Admitting: *Deleted

## 2014-05-23 ENCOUNTER — Telehealth: Payer: Self-pay | Admitting: Cardiovascular Disease

## 2014-05-23 DIAGNOSIS — R5383 Other fatigue: Secondary | ICD-10-CM

## 2014-05-23 DIAGNOSIS — Z79899 Other long term (current) drug therapy: Secondary | ICD-10-CM

## 2014-05-23 DIAGNOSIS — E782 Mixed hyperlipidemia: Secondary | ICD-10-CM

## 2014-05-23 DIAGNOSIS — R5381 Other malaise: Secondary | ICD-10-CM

## 2014-05-23 NOTE — Telephone Encounter (Signed)
New Prob ° ° ° °Pt is requesting blood work prior to appt on 7/16. Would like to speak to a nurse regarding this. Please call. °

## 2014-05-23 NOTE — Telephone Encounter (Signed)
Patient would like lab drawn prior to visit. Lab will be order CMP,LIPID TSH  Patient will go tomorrow 05/24/14 - NPO

## 2014-05-24 LAB — COMPREHENSIVE METABOLIC PANEL
ALK PHOS: 66 U/L (ref 39–117)
ALT: 16 U/L (ref 0–35)
AST: 22 U/L (ref 0–37)
Albumin: 4.6 g/dL (ref 3.5–5.2)
BILIRUBIN TOTAL: 1.4 mg/dL — AB (ref 0.2–1.2)
BUN: 17 mg/dL (ref 6–23)
CO2: 32 mEq/L (ref 19–32)
Calcium: 9.9 mg/dL (ref 8.4–10.5)
Chloride: 100 mEq/L (ref 96–112)
Creat: 0.76 mg/dL (ref 0.50–1.10)
GLUCOSE: 86 mg/dL (ref 70–99)
POTASSIUM: 4.9 meq/L (ref 3.5–5.3)
SODIUM: 139 meq/L (ref 135–145)
TOTAL PROTEIN: 6.9 g/dL (ref 6.0–8.3)

## 2014-05-24 LAB — LIPID PANEL
Cholesterol: 167 mg/dL (ref 0–200)
HDL: 83 mg/dL (ref >39)
LDL CALC: 67 mg/dL (ref 0–99)
TRIGLYCERIDES: 84 mg/dL (ref <150)
Total CHOL/HDL Ratio: 2 Ratio
VLDL: 17 mg/dL (ref 0–40)

## 2014-05-24 LAB — TSH: TSH: 3.036 u[IU]/mL (ref 0.350–4.500)

## 2014-05-25 ENCOUNTER — Ambulatory Visit (INDEPENDENT_AMBULATORY_CARE_PROVIDER_SITE_OTHER): Payer: Medicare Other | Admitting: Cardiovascular Disease

## 2014-05-25 ENCOUNTER — Telehealth: Payer: Self-pay | Admitting: Cardiovascular Disease

## 2014-05-25 ENCOUNTER — Encounter: Payer: Self-pay | Admitting: Cardiovascular Disease

## 2014-05-25 VITALS — BP 142/80 | HR 101 | Ht 66.0 in | Wt 154.0 lb

## 2014-05-25 DIAGNOSIS — E785 Hyperlipidemia, unspecified: Secondary | ICD-10-CM

## 2014-05-25 DIAGNOSIS — I059 Rheumatic mitral valve disease, unspecified: Secondary | ICD-10-CM

## 2014-05-25 DIAGNOSIS — I10 Essential (primary) hypertension: Secondary | ICD-10-CM

## 2014-05-25 DIAGNOSIS — I341 Nonrheumatic mitral (valve) prolapse: Secondary | ICD-10-CM

## 2014-05-25 MED ORDER — HYDROCHLOROTHIAZIDE 12.5 MG PO CAPS: 12.5000 mg | ORAL_CAPSULE | Freq: Every day | ORAL | Status: DC

## 2014-05-25 NOTE — Telephone Encounter (Signed)
Rx was sent to pharmacy electronically for #90 day supply 

## 2014-05-25 NOTE — Telephone Encounter (Signed)
Was seen today.  Hydrochlorothiazide needs to be 90 days instead of 30 days due to her extensive traveling.  Please see if can changed from 30 to 90 day supply.  Please call

## 2014-05-25 NOTE — Patient Instructions (Signed)
Your physician recommends that you schedule a follow-up appointment in: One Year with Dr.Croitoru
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
## 2014-10-18 ENCOUNTER — Other Ambulatory Visit: Payer: Self-pay | Admitting: Cardiovascular Disease

## 2014-10-18 NOTE — Telephone Encounter (Signed)
Rx refill sent to patient pharmacy   

## 2014-11-20 ENCOUNTER — Encounter: Payer: Self-pay | Admitting: Cardiovascular Disease

## 2014-11-20 ENCOUNTER — Ambulatory Visit (INDEPENDENT_AMBULATORY_CARE_PROVIDER_SITE_OTHER): Payer: 59 | Admitting: Cardiovascular Disease

## 2014-11-20 VITALS — BP 146/80 | HR 90 | Resp 20 | Ht 65.0 in | Wt 152.8 lb

## 2014-11-20 DIAGNOSIS — I10 Essential (primary) hypertension: Secondary | ICD-10-CM

## 2014-11-20 DIAGNOSIS — I341 Nonrheumatic mitral (valve) prolapse: Secondary | ICD-10-CM

## 2014-11-20 DIAGNOSIS — E785 Hyperlipidemia, unspecified: Secondary | ICD-10-CM

## 2014-11-20 NOTE — Patient Instructions (Signed)
Your physician wants you to follow-up in: ONE YEAR with Dr.Croitoru.  You will receive a reminder letter in the mail two months in advance. If you don't receive a letter, please call our office to schedule the follow-up appointment.

## 2014-11-21 ENCOUNTER — Encounter: Payer: Self-pay | Admitting: Cardiovascular Disease
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
## 2015-02-02 ENCOUNTER — Other Ambulatory Visit: Payer: Self-pay | Admitting: Specialist

## 2015-02-02 DIAGNOSIS — M545 Low back pain: Secondary | ICD-10-CM

## 2015-02-05 ENCOUNTER — Ambulatory Visit
Admission: RE | Admit: 2015-02-05 | Discharge: 2015-02-05 | Disposition: A | Discharge status: Home / Self Care | Payer: 59 | Source: Ambulatory Visit | Attending: Specialist | Admitting: Specialist

## 2015-02-05 ENCOUNTER — Other Ambulatory Visit: Payer: 59

## 2015-02-05 DIAGNOSIS — M545 Low back pain: Secondary | ICD-10-CM

## 2015-02-05 MED ORDER — GADOBENATE DIMEGLUMINE 529 MG/ML IV SOLN
14.0000 mL | Freq: Once | INTRAVENOUS | Status: AC | PRN
  Administered 2015-02-05: 14 mL via INTRAVENOUS

## 2015-02-15 ENCOUNTER — Other Ambulatory Visit: Payer: 59

## 2015-06-01 ENCOUNTER — Other Ambulatory Visit: Payer: Self-pay | Admitting: Cardiovascular Disease

## 2015-07-03 ENCOUNTER — Other Ambulatory Visit: Payer: Self-pay | Admitting: Cardiovascular Disease

## 2015-07-03 NOTE — Telephone Encounter (Signed)
REFILL 

## 2015-09-28 ENCOUNTER — Ambulatory Visit: Payer: 59 | Admitting: Cardiovascular Disease

## 2015-10-15 ENCOUNTER — Encounter: Payer: Self-pay | Admitting: Cardiology

## 2015-10-15 ENCOUNTER — Ambulatory Visit (INDEPENDENT_AMBULATORY_CARE_PROVIDER_SITE_OTHER): Payer: Medicare Other | Admitting: Cardiology

## 2015-10-15 VITALS — BP 140/82 | HR 91 | Ht 65.5 in | Wt 158.2 lb

## 2015-10-15 DIAGNOSIS — E785 Hyperlipidemia, unspecified: Secondary | ICD-10-CM

## 2015-10-15 DIAGNOSIS — R079 Chest pain, unspecified: Secondary | ICD-10-CM | POA: Diagnosis not present

## 2015-10-15 DIAGNOSIS — M419 Scoliosis, unspecified: Secondary | ICD-10-CM

## 2015-10-15 DIAGNOSIS — I10 Essential (primary) hypertension: Secondary | ICD-10-CM | POA: Diagnosis not present

## 2015-10-15 NOTE — Assessment & Plan Note (Signed)
Followed by Dr. Yates 

## 2015-10-15 NOTE — Assessment & Plan Note (Signed)
On statin Rx 

## 2015-10-15 NOTE — Assessment & Plan Note (Signed)
Pt is seen today as a work in for chest pain.

## 2015-10-15 NOTE — Patient Instructions (Signed)
Your physician has requested that you have a lexiscan myoview. For further information please visit [email or web address]. Please follow instruction sheet, as given.  Please keep your previously scheduled follow-up appointment with Dr Sallyanne Kuster - 12/03/2015 at 4:00p.  If you need a refill on your cardiac medications before your next appointment, please call your pharmacy.

## 2015-10-15 NOTE — Progress Notes (Signed)
10/15/2015 Caitlyn [name]   Apr 01, 1930  [id number]  Primary Physician Gennette Pac, MD Primary Cardiologist: Dr Sallyanne Kuster  HPI:  Pleasant, active 81 y/o female (looks younger)  followed by Dr Tami Ribas, then Dr Rollene Fare, now by Dr Sallyanne Kuster with a history of MVP. 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 She has no history of CAD or prior cath. She had a low risk Myoview in 2012.  She is in the office today because of some chest discomfort. She describes a vague "ache" in her chest. She also has some exertional fatigue, new for her. Her husband urged her to come in. She denies any radiation her arms or neck. She admits she has been under a lot of stress secondary to chronic back problems. She apparently has severe scoliosis followed by Dr Lorin Mercy. She has periodic injections.    Current Outpatient Prescriptions  Medication Sig Dispense Refill  . antiseptic oral rinse (BIOTENE) LIQD 15 mLs by Mouth Rinse route as needed for dry mouth.    Marland Kitchen aspirin 81 MG tablet Take 81 mg by mouth daily.    Marland Kitchen atorvastatin (LIPITOR) 20 MG tablet TAKE 1 TABLET (20 MG TOTAL) BY MOUTH DAILY. 90 tablet 2  . bisacodyl (DULCOLAX) 5 MG EC tablet Take 5 mg by mouth daily as needed for moderate constipation.    . Cholecalciferol (VITAMIN D3) 2000 UNITS TABS Take 2 capsules by mouth daily.    . Coenzyme Q10 (CO Q 10) 100 MG CAPS Take 100 mg by mouth daily.    . cycloSPORINE (RESTASIS) 0.05 % ophthalmic emulsion Place 1 drop into both eyes daily.    . diphenhydramine-acetaminophen (TYLENOL PM) 25-500 MG TABS tablet Take 1 tablet by mouth at bedtime as needed.    . hydrochlorothiazide (MICROZIDE) 12.5 MG capsule TAKE 1 CAPSULE (12.5 MG TOTAL) BY MOUTH DAILY. 90 capsule 1  . Iron-Vitamins (GERITOL COMPLETE) TABS Take 1 tablet by mouth daily.    Marland Kitchen latanoprost (XALATAN) 0.005 % ophthalmic solution Place 1 drop into the  right eye daily.    . meloxicam (MOBIC) 15 MG tablet Take 1 tablet by mouth daily.    . timolol (TIMOPTIC) 0.5 % ophthalmic solution Place 1 drop into the right eye at bedtime.     No current facility-administered medications for this visit.    Allergies  Allergen Reactions  . Latex Hives  . Statins     Patient indicated she was not able to tolerate medications containing Statins     Social History   Social History  . Marital Status: Married    Spouse Name: N/A  . Number of Children: N/A  . Years of Education: N/A   Occupational History  . Not on file.   Social History Main Topics  . Smoking status: Never Smoker   . Smokeless tobacco: Not on file  . Alcohol Use: Yes     Comment: occassional  . Drug Use: No  . Sexual Activity: Not on file   Other Topics Concern  . Not on file   Social History Narrative     Review of Systems: General: negative for chills, fever, night sweats or weight changes.  Cardiovascular: negative for chest pain, dyspnea on exertion, edema, orthopnea, palpitations, paroxysmal nocturnal dyspnea or shortness of breath Dermatological: negative for rash Respiratory: negative for cough or wheezing Urologic: negative for hematuria Abdominal: negative for nausea, vomiting, diarrhea, bright red blood per  rectum, melena, or hematemesis Neurologic: negative for visual changes, syncope, or dizziness Chronic pain secondary to scoliosis All other systems reviewed and are otherwise negative except as noted above.    Blood pressure 140/82, pulse 91, height 5' 5.5" (1.664 m), weight 158 lb 3.2 oz (71.759 kg).  General appearance: alert, cooperative, no distress and appears younger than stated age Neck: no carotid bruit and no JVD Lungs: clear to auscultation bilaterally Heart: regular rate and rhythm Extremities: extremities normal, atraumatic, no cyanosis or edema Pulses: 2+ and symmetric Skin: Skin color, texture, turgor normal. No rashes or  lesions Neurologic: Grossly normal  EKG NSR, NSST changes  ASSESSMENT AND PLAN:   Chest pain with moderate risk for cardiac etiology Pt is seen today as a work in for chest pain.   Essential hypertension Controlled  Hyperlipidemia On statin Rx  Scoliosis deformity of spine Followed by Dr Worthy Keeler  I discussed options with Caitlyn [name]. We decided to proceed with Lexiscan Myoview to r/o cardiac etiology of her chest discomfort and exertional fatigue. She has a follow up in Jan with Dr Sallyanne Kuster.   Kerin Ransom K PA-C 10/15/2015 4:08 PM

## 2015-10-15 NOTE — Assessment & Plan Note (Signed)
Controlled.  

## 2015-10-26 ENCOUNTER — Telehealth (HOSPITAL_COMMUNITY): Payer: Self-pay

## 2015-10-26 NOTE — Telephone Encounter (Signed)
Encounter complete. 

## 2015-10-31 ENCOUNTER — Telehealth: Payer: Self-pay | Admitting: *Deleted

## 2015-10-31 ENCOUNTER — Ambulatory Visit (HOSPITAL_COMMUNITY)
Admission: RE | Admit: 2015-10-31 | Discharge: 2015-10-31 | Disposition: A | Discharge status: Home / Self Care | Payer: Medicare Other | Source: Ambulatory Visit | Attending: Cardiovascular Disease | Admitting: Cardiovascular Disease

## 2015-10-31 DIAGNOSIS — R079 Chest pain, unspecified: Secondary | ICD-10-CM | POA: Insufficient documentation

## 2015-10-31 DIAGNOSIS — I10 Essential (primary) hypertension: Secondary | ICD-10-CM | POA: Diagnosis not present

## 2015-10-31 DIAGNOSIS — R0609 Other forms of dyspnea: Secondary | ICD-10-CM | POA: Insufficient documentation

## 2015-10-31 DIAGNOSIS — R5383 Other fatigue: Secondary | ICD-10-CM | POA: Diagnosis not present

## 2015-10-31 DIAGNOSIS — Z8249 Family history of ischemic heart disease and other diseases of the circulatory system: Secondary | ICD-10-CM | POA: Insufficient documentation

## 2015-10-31 DIAGNOSIS — R9439 Abnormal result of other cardiovascular function study: Secondary | ICD-10-CM | POA: Insufficient documentation

## 2015-10-31 LAB — MYOCARDIAL PERFUSION IMAGING
LV dias vol: 70 mL
LV sys vol: 27 mL
Peak HR: 97 {beats}/min
Rest HR: 71 {beats}/min
SDS: 5
SRS: 8
SSS: 13
TID: 1.3

## 2015-10-31 MED ORDER — REGADENOSON 0.4 MG/5ML IV SOLN
0.4000 mg | Freq: Once | INTRAVENOUS | Status: AC
  Administered 2015-10-31: 0.4 mg via INTRAVENOUS

## 2015-10-31 MED ORDER — TECHNETIUM TC 99M SESTAMIBI GENERIC - CARDIOLITE
10.6000 | Freq: Once | INTRAVENOUS | Status: AC | PRN
Start: 2015-10-31 — End: 2015-10-31
  Administered 2015-10-31: 10.6 via INTRAVENOUS

## 2015-10-31 MED ORDER — TECHNETIUM TC 99M SESTAMIBI GENERIC - CARDIOLITE
31.7000 | Freq: Once | INTRAVENOUS | Status: AC | PRN
  Administered 2015-10-31: 31.7 via INTRAVENOUS

## 2015-10-31 MED ORDER — AMINOPHYLLINE 25 MG/ML IV SOLN
75.0000 mg | Freq: Once | INTRAVENOUS | Status: AC
  Administered 2015-10-31: 75 mg via INTRAVENOUS

## 2015-10-31 NOTE — Telephone Encounter (Signed)
Pt here today for nuclear stress test following a work in visit w/ Lurena Joiner on 5th for CP.  She visited w/ me today to address a separate issue. She suffers w/ chronic back pain d/t scoliosis/kyphosis. She is followed by Dr. Louanne Skye for this. She's had consultation on possibility of back surgery, which she understands comes at high risk. She's elected not to proceed w/ surgery d/t risks. Therefore she is going route of medical management for her back pain.  Currently, Dr. Louanne Skye prescribes her meloxicam 15mg , she takes 1 tablet daily. Another orthopedist who saw her to perform a steroid injection this week states concern over long term use of this medication as relates to her heart history. He had asked her to follow up w/ [place] on recommendations.  She has a 31yr follow up w/ Dr. Sallyanne Kuster on Jan 12th. Also sees Dr. Louanne Skye earlier that day. I informed her it would be my intention to get her a recommendation on medications this week and call her at home. Pt is aware that I will defer for advice on this.

## 2015-10-31 NOTE — Telephone Encounter (Signed)
Do not recommend daily use of meloxicam.  Can increase risk of MI/stroke, as well as GI inflammation/bleed.  Would suggest she use acetaminophen regularly if needed 1 gm once to twice daily.  Use meloxicam only for bad days, preferably 7.5 mg once daily for 2-3 days, then back to acetaminophen.

## 2015-11-01 NOTE — Telephone Encounter (Signed)
Returned call to patient.Kristin's recommendations given. 

## 2015-11-14 ENCOUNTER — Other Ambulatory Visit: Payer: Self-pay | Admitting: Orthopaedic Surgery

## 2015-11-14 DIAGNOSIS — M4802 Spinal stenosis, cervical region: Secondary | ICD-10-CM

## 2015-11-16 ENCOUNTER — Ambulatory Visit
Admission: RE | Admit: 2015-11-16 | Discharge: 2015-11-16 | Disposition: A | Discharge status: Home / Self Care | Payer: Medicare Other | Source: Ambulatory Visit | Attending: Orthopaedic Surgery | Admitting: Orthopaedic Surgery

## 2015-11-16 DIAGNOSIS — M4802 Spinal stenosis, cervical region: Secondary | ICD-10-CM

## 2015-11-19 ENCOUNTER — Other Ambulatory Visit: Payer: Medicare Other

## 2015-11-24 ENCOUNTER — Other Ambulatory Visit: Payer: Self-pay | Admitting: Cardiovascular Disease

## 2015-11-26 ENCOUNTER — Other Ambulatory Visit: Payer: Self-pay | Admitting: *Deleted

## 2015-11-26 MED ORDER — HYDROCHLOROTHIAZIDE 12.5 MG PO CAPS: 12.5000 mg | ORAL_CAPSULE | Freq: Every day | ORAL | Status: DC

## 2015-11-27 ENCOUNTER — Telehealth: Payer: Self-pay | Admitting: Cardiovascular Disease

## 2015-11-27 ENCOUNTER — Encounter (INDEPENDENT_AMBULATORY_CARE_PROVIDER_SITE_OTHER): Payer: Medicare Other | Admitting: Ophthalmology

## 2015-11-27 DIAGNOSIS — H43813 Vitreous degeneration, bilateral: Secondary | ICD-10-CM | POA: Diagnosis not present

## 2015-11-27 DIAGNOSIS — I10 Essential (primary) hypertension: Secondary | ICD-10-CM

## 2015-11-27 DIAGNOSIS — H35033 Hypertensive retinopathy, bilateral: Secondary | ICD-10-CM

## 2015-11-27 DIAGNOSIS — H34831 Tributary (branch) retinal vein occlusion, right eye, with macular edema: Secondary | ICD-10-CM | POA: Diagnosis not present

## 2015-11-27 NOTE — Telephone Encounter (Signed)
Ms.Holmes is wanting to have lab orders sent for her before her appt on 12/03/15

## 2015-11-27 NOTE — Telephone Encounter (Signed)
Caitlyn [name] is wanting to have lab orders sent for her before her appt on 12/03/15  CMET, Lipid, TSH ordered

## 2015-11-28 LAB — COMPREHENSIVE METABOLIC PANEL
ALBUMIN: 4.1 g/dL (ref 3.6–5.1)
ALT: 15 U/L (ref 6–29)
AST: 20 U/L (ref 10–35)
Alkaline Phosphatase: 66 U/L (ref 33–130)
BILIRUBIN TOTAL: 1.3 mg/dL — AB (ref 0.2–1.2)
BUN: 12 mg/dL (ref 7–25)
CALCIUM: 9.5 mg/dL (ref 8.6–10.4)
CO2: 31 mmol/L (ref 20–31)
Chloride: 101 mmol/L (ref 98–110)
Creat: 0.66 mg/dL (ref 0.60–0.88)
Glucose, Bld: 84 mg/dL (ref 65–99)
Potassium: 4.6 mmol/L (ref 3.5–5.3)
Sodium: 142 mmol/L (ref 135–146)
Total Protein: 6.4 g/dL (ref 6.1–8.1)

## 2015-11-28 LAB — LIPID PANEL
CHOL/HDL RATIO: 1.8 ratio (ref <=5.0)
Cholesterol: 163 mg/dL (ref 125–200)
HDL: 92 mg/dL (ref >=46)
LDL Cholesterol: 58 mg/dL (ref <130)
TRIGLYCERIDES: 66 mg/dL (ref <150)
VLDL: 13 mg/dL (ref <30)

## 2015-11-29 LAB — TSH: TSH: 1.977 u[IU]/mL (ref 0.350–4.500)

## 2015-12-03 ENCOUNTER — Ambulatory Visit (INDEPENDENT_AMBULATORY_CARE_PROVIDER_SITE_OTHER): Payer: Medicare Other | Admitting: Cardiovascular Disease

## 2015-12-03 ENCOUNTER — Encounter: Payer: Self-pay | Admitting: Cardiovascular Disease

## 2015-12-03 VITALS — BP 142/90 | HR 98 | Ht 65.5 in | Wt 152.5 lb

## 2015-12-03 DIAGNOSIS — I10 Essential (primary) hypertension: Secondary | ICD-10-CM | POA: Diagnosis not present

## 2015-12-03 DIAGNOSIS — H35033 Hypertensive retinopathy, bilateral: Secondary | ICD-10-CM

## 2015-12-03 DIAGNOSIS — E785 Hyperlipidemia, unspecified: Secondary | ICD-10-CM

## 2015-12-03 DIAGNOSIS — I341 Nonrheumatic mitral (valve) prolapse: Secondary | ICD-10-CM

## 2015-12-03 NOTE — Patient Instructions (Signed)
Dr. Croitoru recommends that you schedule a follow-up appointment in: ONE YEAR   

## 2015-12-04 DIAGNOSIS — H35039 Hypertensive retinopathy, unspecified eye: Secondary | ICD-10-CM | POA: Insufficient documentation

## 2015-12-04 NOTE — Progress Notes (Signed)
Patient ID: Caitlyn [name], female   DOB: 1930-10-16, 82 y.o.   MRN: [id number]    Cardiology Office Note    Date:  12/04/2015   ID:  Caitlyn [name], DOB [birth year], MRN [id number]  PCP:  Caitlyn Pac, MD  Cardiologist:   Caitlyn Klein, MD   Chief Complaint  Patient presents with  . Follow-up    1 month//pt c/o occasional chest soreness, occasional light dizziness, and swelling in bilateral feet and ankles--some numbness at night on her feet under her toes.    History of Present Illness:  Caitlyn [name] is a 82 y.o. female with long-standing systemic hypertension and hyperlipidemia and mild mitral valve prolapse returns for follow-up. Recently she has had some ophthalmological issues and problems with severe pain in her lumbar spine. She saw Caitlyn [name], a retinal specialist who diagnosed a "blood clot behind the retina in the right eye and a wrinkle in the retina in the left eye." There appear to have been significant hypertensive changes in both eyes. I think that she had a retinal vein thrombosis. There is no mention of any atheroembolic lesions in her eyes. She has received 2 injections in her lumbar spine and has been prescribed methocarbamol and prednisolone for her back. She is trying to avoid nostril anti-inflammatory drugs since she has only borderline blood pressure control. At home typically her blood pressures in the 130s/80s but when she has a lot of pain and will easily increase to the 150s. Both her blood pressure and her heart rate are a little elevated in the office today, but she is hurting.  She has no known history of coronary disease with a normal nuclear stress test in 2012 and no history of previous cardiac catheterization. Beta blockers have generally worked well to control her palpitations. In the past she has had side effects with certain statins (simvastatin, Crestor) but she seems to tolerate atorvastatin reasonably well.   Past Medical History  Diagnosis Date    . Anemia   . Blood transfusion   . Hyperlipidemia   . Hypertension   . Osteoporosis   . COPD (chronic obstructive pulmonary disease) ([place])   . AC (acromioclavicular) joint bone spurs 2008    Back  . Bronchitis   . Sleep apnea     no treatment at this time, had sleep study apprx 8 years ago  . GERD (gastroesophageal reflux disease)     uses tums  . Arthritis   . Hepatitis     1970's  . MVP (mitral valve prolapse)     with mild to mod MR and TR 04/2012; followed [name])  . Mitral valve prolapse 10/16/2013    Past Surgical History  Procedure Laterality Date  . Spine surgery    . Anterior cervical decomp/discectomy fusion    . Hemorrhoid surgery  1969, 1971  . Knee arthroscopy  2007 & 2008  . Tongue biopsy  2000  . Abdominal hysterectomy  1964  . Uvulectomy    . Inguinal hernia repair  06/16/2012    Procedure: HERNIA REPAIR INGUINAL ADULT;  Surgeon: Caitlyn Hook, DO;  Location: [facility];  Service: General;  Laterality: Right;  . Hernia repair    . US echocardiography  06/07/2012    mild concentric LVH,LA mod. dilated,RA is mild to mod. dilated, mild MVP,prolapse of the posterior leaflet,mild mitral annular ca+,mild to mod MR & TR, AOV mildly sclerotic, aortic root sclerosis/ca+, insignificant pericardial effusion.  Marland Kitchen Nm myocar perf wall motion  05/27/2011    normal    Outpatient Prescriptions Prior to Visit  Medication Sig Dispense Refill  . antiseptic oral rinse (BIOTENE) LIQD 15 mLs by Mouth Rinse route as needed for dry mouth.    Marland Kitchen aspirin 81 MG tablet Take 81 mg by mouth daily.    Marland Kitchen atorvastatin (LIPITOR) 20 MG tablet TAKE 1 TABLET (20 MG TOTAL) BY MOUTH DAILY. 90 tablet 2  . bisacodyl (DULCOLAX) 5 MG EC tablet Take 5 mg by mouth daily as needed for moderate constipation.    . Cholecalciferol (VITAMIN D3) 2000 UNITS TABS Take 2 capsules by mouth daily.    . Coenzyme Q10 (CO Q 10) 100 MG CAPS Take 100 mg by mouth daily.    . cycloSPORINE (RESTASIS) 0.05 %  ophthalmic emulsion Place 1 drop into both eyes daily.    . diphenhydramine-acetaminophen (TYLENOL PM) 25-500 MG TABS tablet Take 1 tablet by mouth at bedtime as needed.    . hydrochlorothiazide (MICROZIDE) 12.5 MG capsule Take 1 capsule (12.5 mg total) by mouth daily. 90 capsule 0  . Iron-Vitamins (GERITOL COMPLETE) TABS Take 1 tablet by mouth daily.    Marland Kitchen latanoprost (XALATAN) 0.005 % ophthalmic solution Place 1 drop into the right eye daily.    . timolol (TIMOPTIC) 0.5 % ophthalmic solution Place 1 drop into the right eye at bedtime.    . meloxicam (MOBIC) 15 MG tablet Take 1 tablet by mouth daily.     No facility-administered medications prior to visit.     Allergies:   Latex and Statins   Social History   Social History  . Marital Status: Married    Spouse Name: N/A  . Number of Children: N/A  . Years of Education: N/A   Social History Main Topics  . Smoking status: Never Smoker   . Smokeless tobacco: None  . Alcohol Use: Yes     Comment: occassional  . Drug Use: No  . Sexual Activity: Not Asked   Other Topics Concern  . None   Social History Narrative     Family History:  The patient's   family history includes Alzheimer's disease in her father and mother.   ROS:   Please see the history of present illness.    ROS All other systems reviewed and are negative.   PHYSICAL EXAM:   VS:  BP 142/90 mmHg  Pulse 98  Ht 5' 5.5" (1.664 m)  Wt 152 lb 8 oz (69.174 kg)  BMI 24.98 kg/m2   GEN: Well nourished, well developed, in no acute distress HEENT: normal Neck: no JVD, carotid bruits, or masses Cardiac: RRR; no murmurs, rubs, or gallops,no edema  Respiratory:  clear to auscultation bilaterally, normal work of breathing GI: soft, nontender, nondistended, + BS MS: no deformity or atrophy Skin: warm and dry, no rash Neuro:  Alert and Oriented x 3, Strength and sensation are intact Psych: euthymic mood, full affect  Wt Readings from Last 3 Encounters:  12/03/15 152  lb 8 oz (69.174 kg)  10/31/15 158 lb (71.668 kg)  10/15/15 158 lb 3.2 oz (71.759 kg)      Studies/Labs Reviewed:   EKG:  EKG is ordered today.  The ekg ordered today demonstrates borderline sinus tachycardia, otherwise normal tracing  Recent Labs: 11/27/2015: ALT 15; BUN 12; Creat 0.66; Potassium 4.6; Sodium 142; TSH 1.977   Lipid Panel    Component Value Date/Time   CHOL 163 11/27/2015 1125   TRIG 66 11/27/2015 1125   HDL 92 11/27/2015  1125   CHOLHDL 1.8 11/27/2015 1125   VLDL 13 11/27/2015 1125   LDLCALC 58 11/27/2015 1125     ASSESSMENT:    1. Essential hypertension   2. Hyperlipidemia   3. Mitral valve prolapse   4. Hypertensive retinopathy, bilateral      PLAN:  In order of problems listed above:  1. HTN: Allowing for episodic increases in blood pressure related to back pain, her blood pressure control appears adequate 2. HLP: Excellent lipid profile on current regimen 3. MVP:  asymptomatic at this time, had mild posterior leaflet prolapse with mild-moderate insufficiency. Dilated left atrium, normal left ventricular size and function by echo  4. Retinopathy: discussed the fact that the current changes are likely a consequence of years and years of hypertension rather than recent fluctuations in blood pressure related to pain.    Medication Adjustments/Labs and Tests Ordered: Current medicines are reviewed at length with the patient today.  Concerns regarding medicines are outlined above.  Medication changes, Labs and Tests ordered today are listed in the Patient Instructions below. Patient Instructions  Dr. Sallyanne Kuster recommends that you schedule a follow-up appointment in: ONE YEAR        SignedSanda Klein, MD  12/04/2015 4:35 PM    [address], [place], [place]  [address] Phone: [phone]; Fax: [phone]

## 2016-01-09 ENCOUNTER — Encounter (INDEPENDENT_AMBULATORY_CARE_PROVIDER_SITE_OTHER): Payer: Medicare Other | Admitting: Ophthalmology

## 2016-01-14 ENCOUNTER — Encounter (INDEPENDENT_AMBULATORY_CARE_PROVIDER_SITE_OTHER): Payer: Medicare Other | Admitting: Ophthalmology

## 2016-01-14 DIAGNOSIS — H34831 Tributary (branch) retinal vein occlusion, right eye, with macular edema: Secondary | ICD-10-CM | POA: Diagnosis not present

## 2016-01-14 DIAGNOSIS — H35372 Puckering of macula, left eye: Secondary | ICD-10-CM | POA: Diagnosis not present

## 2016-01-14 DIAGNOSIS — H43813 Vitreous degeneration, bilateral: Secondary | ICD-10-CM | POA: Diagnosis not present

## 2016-01-14 DIAGNOSIS — H35033 Hypertensive retinopathy, bilateral: Secondary | ICD-10-CM

## 2016-01-14 DIAGNOSIS — I10 Essential (primary) hypertension: Secondary | ICD-10-CM | POA: Diagnosis not present

## 2016-03-19 ENCOUNTER — Encounter (INDEPENDENT_AMBULATORY_CARE_PROVIDER_SITE_OTHER): Payer: Medicare Other | Admitting: Ophthalmology

## 2016-03-19 DIAGNOSIS — H348312 Tributary (branch) retinal vein occlusion, right eye, stable: Secondary | ICD-10-CM

## 2016-03-19 DIAGNOSIS — I10 Essential (primary) hypertension: Secondary | ICD-10-CM

## 2016-03-19 DIAGNOSIS — H35033 Hypertensive retinopathy, bilateral: Secondary | ICD-10-CM

## 2016-03-19 DIAGNOSIS — H43813 Vitreous degeneration, bilateral: Secondary | ICD-10-CM | POA: Diagnosis not present

## 2016-04-15 ENCOUNTER — Other Ambulatory Visit: Payer: Self-pay

## 2016-04-15 MED ORDER — ATORVASTATIN CALCIUM 20 MG PO TABS: ORAL_TABLET | ORAL | Status: DC

## 2016-06-25 ENCOUNTER — Encounter (INDEPENDENT_AMBULATORY_CARE_PROVIDER_SITE_OTHER): Payer: Medicare Other | Admitting: Ophthalmology

## 2016-07-07 ENCOUNTER — Encounter (INDEPENDENT_AMBULATORY_CARE_PROVIDER_SITE_OTHER): Payer: Medicare Other | Admitting: Ophthalmology

## 2016-07-07 DIAGNOSIS — H35033 Hypertensive retinopathy, bilateral: Secondary | ICD-10-CM

## 2016-07-07 DIAGNOSIS — I10 Essential (primary) hypertension: Secondary | ICD-10-CM | POA: Diagnosis not present

## 2016-07-07 DIAGNOSIS — H348312 Tributary (branch) retinal vein occlusion, right eye, stable: Secondary | ICD-10-CM | POA: Diagnosis not present

## 2016-07-07 DIAGNOSIS — H35342 Macular cyst, hole, or pseudohole, left eye: Secondary | ICD-10-CM

## 2016-07-07 DIAGNOSIS — H43813 Vitreous degeneration, bilateral: Secondary | ICD-10-CM

## 2016-07-07 DIAGNOSIS — H353112 Nonexudative age-related macular degeneration, right eye, intermediate dry stage: Secondary | ICD-10-CM | POA: Diagnosis not present

## 2016-08-10 ENCOUNTER — Other Ambulatory Visit: Payer: Self-pay | Admitting: Cardiovascular Disease

## 2016-08-12 NOTE — Telephone Encounter (Signed)
Rx request sent to pharmacy.  

## 2016-09-09 ENCOUNTER — Ambulatory Visit (INDEPENDENT_AMBULATORY_CARE_PROVIDER_SITE_OTHER): Payer: Medicare Other | Admitting: Orthopaedic Surgery

## 2016-10-27 ENCOUNTER — Telehealth (INDEPENDENT_AMBULATORY_CARE_PROVIDER_SITE_OTHER): Payer: Self-pay | Admitting: Orthopaedic Surgery

## 2016-10-27 DIAGNOSIS — M48061 Spinal stenosis, lumbar region without neurogenic claudication: Secondary | ICD-10-CM

## 2016-10-27 NOTE — Telephone Encounter (Signed)
Patient is requesting a call from Dr.yates regarding pain in her waistline coming from back. She is wanting to know if she should see Dr.yates for xray or Dr.Newton for injection. She wants to know what Dr.yates thinks she should do, she would like to speak with him.  Cb#: [phone]

## 2016-10-27 NOTE — Telephone Encounter (Signed)
Please advise. Would you like me to make patient return appt?

## 2016-10-27 NOTE — Telephone Encounter (Signed)
Order entered. I called patient and advised. 

## 2016-10-27 NOTE — Telephone Encounter (Signed)
OK for another ESI with Ernestina Patches

## 2016-11-17 ENCOUNTER — Encounter (INDEPENDENT_AMBULATORY_CARE_PROVIDER_SITE_OTHER): Payer: Medicare Other | Admitting: Physical Medicine and Rehabilitation

## 2016-11-18 ENCOUNTER — Ambulatory Visit (INDEPENDENT_AMBULATORY_CARE_PROVIDER_SITE_OTHER): Payer: Medicare Other | Admitting: Orthopaedic Surgery

## 2016-12-01 ENCOUNTER — Encounter (INDEPENDENT_AMBULATORY_CARE_PROVIDER_SITE_OTHER): Payer: Medicare Other | Admitting: Physical Medicine and Rehabilitation

## 2016-12-03 ENCOUNTER — Encounter (HOSPITAL_COMMUNITY): Payer: Self-pay | Admitting: *Deleted

## 2016-12-03 ENCOUNTER — Emergency Department (HOSPITAL_COMMUNITY): Payer: Medicare Other

## 2016-12-03 ENCOUNTER — Encounter (INDEPENDENT_AMBULATORY_CARE_PROVIDER_SITE_OTHER): Payer: Medicare Other | Admitting: Physical Medicine and Rehabilitation

## 2016-12-03 ENCOUNTER — Emergency Department (HOSPITAL_COMMUNITY)
Admission: EM | Admit: 2016-12-03 | Discharge: 2016-12-03 | Disposition: A | Discharge status: Home / Self Care | Payer: Medicare Other | Attending: Emergency Medicine | Admitting: Emergency Medicine

## 2016-12-03 DIAGNOSIS — Z7982 Long term (current) use of aspirin: Secondary | ICD-10-CM | POA: Insufficient documentation

## 2016-12-03 DIAGNOSIS — J449 Chronic obstructive pulmonary disease, unspecified: Secondary | ICD-10-CM | POA: Diagnosis not present

## 2016-12-03 DIAGNOSIS — Z91040 Latex allergy status: Secondary | ICD-10-CM | POA: Diagnosis not present

## 2016-12-03 DIAGNOSIS — I10 Essential (primary) hypertension: Secondary | ICD-10-CM | POA: Insufficient documentation

## 2016-12-03 DIAGNOSIS — K5900 Constipation, unspecified: Secondary | ICD-10-CM | POA: Diagnosis present

## 2016-12-03 DIAGNOSIS — K5909 Other constipation: Secondary | ICD-10-CM | POA: Insufficient documentation

## 2016-12-03 MED ORDER — FLEET ENEMA 7-19 GM/118ML RE ENEM
1.0000 | ENEMA | Freq: Once | RECTAL | Status: AC
  Administered 2016-12-03: 1 via RECTAL
  Filled 2016-12-03: qty 1

## 2016-12-03 MED ORDER — LIDOCAINE HCL 2 % EX GEL
1.0000 "application " | Freq: Once | CUTANEOUS | Status: AC
  Administered 2016-12-03: 1 via TOPICAL
  Filled 2016-12-03: qty 20

## 2016-12-03 NOTE — ED Notes (Signed)
Patient transported to X-ray 

## 2016-12-03 NOTE — Discharge Instructions (Signed)
Increase miralax to twice daily.   Continue fleets enema daily.   Continue preparation H twice daily./   See your doctor   Stay hydrated   Return to ER if you have severe abdominal pain, no bowel movements for several days, vomiting, fevers.

## 2016-12-03 NOTE — ED Triage Notes (Addendum)
Pt reports being constipated, last bm was 4 days ago and no relief with laxatives. Sent here from pcp for manual disimpaction.

## 2016-12-03 NOTE — ED Provider Notes (Addendum)
[facility] DEPT Provider Note   CSN: [id number] Arrival date & time: 12/03/16  1559     History   Chief Complaint Chief Complaint  Patient presents with  . Constipation    HPI Caitlyn [name] is a 83 y.o. female hx of COPD, GERD, HTN, mitral valve prolapse, multiple hemorrhoid repairs here with constipation. Patient has been constipated for the last 4 days. Patient states that she tried to use the bathroom but only some watery stool came out. States that her abdomen is more distended in the usual but denies any vomiting. She states that she is still passing gas. Patient has tried MiraLAX with no relief. Sent from her doctor for possible disimpaction.   The history is provided by the patient.    Past Medical History:  Diagnosis Date  . AC (acromioclavicular) joint bone spurs 2008   Back  . Anemia   . Arthritis   . Blood transfusion   . Bronchitis   . COPD (chronic obstructive pulmonary disease) ([place])   . GERD (gastroesophageal reflux disease)    uses tums  . Hepatitis    1970's  . Hyperlipidemia   . Hypertension   . Mitral valve prolapse 10/16/2013  . MVP (mitral valve prolapse)    with mild to mod MR and TR 04/2012; followed by Dr. Rollene Fare [facility])  . Osteoporosis   . Sleep apnea    no treatment at this time, had sleep study apprx 8 years ago    Patient Active Problem List   Diagnosis Date Noted  . Hypertensive retinopathy 12/04/2015  . Chest pain with moderate risk for cardiac etiology 10/15/2015  . Scoliosis deformity of spine 10/15/2015  . Hyperlipidemia 10/16/2013  . Essential hypertension 10/16/2013  . Mitral valve prolapse 10/16/2013    Past Surgical History:  Procedure Laterality Date  . ABDOMINAL HYSTERECTOMY  1964  . ANTERIOR CERVICAL DECOMP/DISCECTOMY FUSION    . [place]  . HERNIA REPAIR    . INGUINAL HERNIA REPAIR  06/16/2012   Procedure: HERNIA REPAIR INGUINAL ADULT;  Surgeon: Madilyn Hook, DO;  Location: [facility];  Service:  General;  Laterality: Right;  . KNEE ARTHROSCOPY  2007 & 2008  . NM MYOCAR PERF WALL MOTION  05/27/2011   normal  . SPINE SURGERY    . TONGUE BIOPSY  2000  . US ECHOCARDIOGRAPHY  06/07/2012   mild concentric LVH,LA mod. dilated,RA is mild to mod. dilated, mild MVP,prolapse of the posterior leaflet,mild mitral annular ca+,mild to mod MR & TR, AOV mildly sclerotic, aortic root sclerosis/ca+, insignificant pericardial effusion.  Marland Kitchen UVULECTOMY      OB History    No data available       Home Medications    Prior to Admission medications   Medication Sig Start Date End Date Taking? Authorizing Provider  antiseptic oral rinse (BIOTENE) LIQD 15 mLs by Mouth Rinse route as needed for dry mouth.   Yes Historical Provider, MD  aspirin 81 MG tablet Take 81 mg by mouth daily.   Yes Historical Provider, MD  atorvastatin (LIPITOR) 20 MG tablet TAKE 1 TABLET (20 MG TOTAL) BY MOUTH DAILY. 04/15/16  Yes Mihai Croitoru, MD  bisacodyl (DULCOLAX) 5 MG EC tablet Take 5 mg by mouth daily as needed for moderate constipation.   Yes Historical Provider, MD  Cholecalciferol (VITAMIN D3) 2000 UNITS TABS Take 2 capsules by mouth daily.   Yes Historical Provider, MD  Coenzyme Q10 (CO Q 10) 100 MG CAPS Take 100 mg  by mouth daily.   Yes Historical Provider, MD  cycloSPORINE (RESTASIS) 0.05 % ophthalmic emulsion Place 1 drop into both eyes daily.   Yes Historical Provider, MD  diphenhydramine-acetaminophen (TYLENOL PM) 25-500 MG TABS tablet Take 1 tablet by mouth at bedtime as needed.   Yes Historical Provider, MD  hydrochlorothiazide (MICROZIDE) 12.5 MG capsule TAKE 1 CAPSULE (12.5 MG TOTAL) BY MOUTH DAILY. 08/12/16  Yes Mihai Croitoru, MD  Ibuprofen (ADVIL PO) Take 1 capsule by mouth daily.   Yes Historical Provider, MD  Ibuprofen-Diphenhydramine Cit (ADVIL PM PO) Take 1 tablet by mouth every evening.   Yes Historical Provider, MD  Iron-Vitamins (GERITOL COMPLETE) TABS Take 1 tablet by mouth daily.   Yes Historical  Provider, MD  latanoprost (XALATAN) 0.005 % ophthalmic solution Place 1 drop into the right eye daily. 04/19/14  Yes Historical Provider, MD  timolol (TIMOPTIC) 0.5 % ophthalmic solution Place 1 drop into the right eye at bedtime. 08/17/14  Yes Historical Provider, MD    Family History Family History  Problem Relation Age of Onset  . Alzheimer's disease Mother   . Alzheimer's disease Father     Social History Social History  Substance Use Topics  . Smoking status: Never Smoker  . Smokeless tobacco: Not on file  . Alcohol use Yes     Comment: occassional     Allergies   Latex and Statins   Review of Systems Review of Systems  Gastrointestinal: Positive for constipation.  All other systems reviewed and are negative.    Physical Exam Updated Vital Signs BP 155/97   Pulse 119   Temp 98.7 F (37.1 C) (Oral)   Resp 16   SpO2 97%   Physical Exam  Constitutional: She is oriented to person, place, and time.  Uncomfortable   HENT:  Head: Normocephalic.  Mouth/Throat: Oropharynx is clear and moist.  Eyes: EOM are normal. Pupils are equal, round, and reactive to light.  Neck: Normal range of motion. Neck supple.  Cardiovascular: Normal rate, regular rhythm and normal heart sounds.   Pulmonary/Chest: Effort normal and breath sounds normal. No respiratory distress. She has no wheezes. She has no rales.  Abdominal: Soft. Bowel sounds are normal.  Mildly distended, nontender   Genitourinary:  Genitourinary Comments: + stool impaction   Musculoskeletal: Normal range of motion.  Neurological: She is alert and oriented to person, place, and time.  Skin: Skin is warm.  Psychiatric: She has a normal mood and affect.  Nursing note and vitals reviewed.    ED Treatments / Results  Labs (all labs ordered are listed, but only abnormal results are displayed) Labs Reviewed - No data to display  EKG  EKG Interpretation None       Radiology Dg Abdomen Acute  W/chest  Result Date: 12/03/2016 CLINICAL DATA:  Cough and congestion x3 days. Constipation x5 days. History of COPD, hypertension and hernia repair. EXAM: DG ABDOMEN ACUTE W/ 1V CHEST COMPARISON:  CXR from 06/15/2012 FINDINGS: Hyperinflated lungs consistent with history of COPD. Heart is normal in size. The aorta is uncoiled in appearance without aneurysm. There is aortic atherosclerosis. 5 mm nodular density in the mid right lung is stable in appearance dating back to 2013 and consistent with a benign finding possibly a fissural lymph node. No pulmonary consolidation, CHF nor effusion. ACDF of the lower cervical spine. Osteoarthritis of both glenohumeral and AC joints with inferomedial spurring of the humeral heads bilaterally. Increased colonic stool burden. Dextrorotatory scoliosis of the lumbar spine with levoscoliosis  of the lower thoracic spine. No suspicious calculi. Phleboliths are seen in the pelvis. IMPRESSION: 1. COPD without acute pulmonary disease. Stable nodular density in the right mid lung dating back to 2013 consistent with a benign finding. 2. Increased colonic stool burden consistent with constipation. 3. Thoracolumbar scoliosis.  No acute osseous abnormality. Electronically Signed   By: Ashley Royalty M.D.   On: 12/03/2016 20:48    Procedures Fecal disimpaction Date/Time: 12/03/2016 7:57 PM Performed by: Drenda Freeze Authorized by: Drenda Freeze  Consent: Verbal consent not obtained. Risks and benefits: risks, benefits and alternatives were discussed Consent given by: patient Patient understanding: patient states understanding of the procedure being performed Patient consent: the patient's understanding of the procedure does not match consent given Procedure consent: procedure consent does not match procedure scheduled Relevant documents: relevant documents not present or verified Test results: test results not available Required items: required blood products, implants,  devices, and special equipment available Patient identity confirmed: verbally with patient Local anesthesia used: yes  Anesthesia: Local anesthesia used: yes Local Anesthetic: topical anesthetic  Sedation: Patient sedated: no Patient tolerance: Patient tolerated the procedure well with no immediate complications    (including critical care time)  Medications Ordered in ED Medications  lidocaine (XYLOCAINE) 2 % jelly 1 application (1 application Topical Given 12/03/16 1910)  sodium phosphate (FLEET) 7-19 GM/118ML enema 1 enema (1 enema Rectal Given 12/03/16 1910)     Initial Impression / Assessment and Plan / ED Course  I have reviewed the triage vital signs and the nursing notes.  Pertinent labs & imaging results that were available during my care of the patient were reviewed by me and considered in my medical decision making (see chart for details).  Caitlyn [name] is a 83 y.o. female here with stool impaction. Will disimpact patient, given enema. Will get xrays to r/o ileus vs SBO,.   9:53 PM xrays showed constipation. Disimpaction performed. Had 3 bowel movements after the enema. Will increase miralax to twice daily, continue fleets enema and anusol cream. Of note, patient is tachycardic in the ED. States that her baseline HR is around 110-120 due to chronic pain from scoliosis. No vomiting and doesn't appear dehydrated. Will not need further workup for chronic tachycardia.    Final Clinical Impressions(s) / ED Diagnoses   Final diagnoses:  None    New Prescriptions New Prescriptions   No medications on file     Drenda Freeze, MD 12/03/16 2154    Drenda Freeze, MD 12/03/16 2224

## 2016-12-03 NOTE — ED Notes (Signed)
ED Provider at bedside. 

## 2016-12-03 NOTE — ED Notes (Signed)
Dr. Amedeo Plenty called about this patient.  States patient is fecally impacted and needs to be disimpacted and/or admitted.

## 2016-12-04 ENCOUNTER — Telehealth (INDEPENDENT_AMBULATORY_CARE_PROVIDER_SITE_OTHER): Payer: Self-pay | Admitting: *Deleted

## 2016-12-04 NOTE — Telephone Encounter (Signed)
Pt called stating he was in the ER because of constipation, got an x-ray and it showed something about her back and wants to make sure she is still able to get injection in back from Dr. Ernestina Patches next week.

## 2016-12-04 NOTE — Telephone Encounter (Signed)
Please advise. Ok for injection?

## 2016-12-04 NOTE — Telephone Encounter (Signed)
Ucall. OK .  

## 2016-12-05 NOTE — Telephone Encounter (Signed)
I called patient and left voicemail advising.

## 2016-12-08 ENCOUNTER — Ambulatory Visit (INDEPENDENT_AMBULATORY_CARE_PROVIDER_SITE_OTHER): Payer: Medicare Other | Admitting: Cardiovascular Disease

## 2016-12-08 ENCOUNTER — Encounter: Payer: Self-pay | Admitting: Cardiovascular Disease

## 2016-12-08 VITALS — BP 138/88 | HR 96 | Ht 65.0 in | Wt 148.0 lb

## 2016-12-08 DIAGNOSIS — E7800 Pure hypercholesterolemia, unspecified: Secondary | ICD-10-CM | POA: Diagnosis not present

## 2016-12-08 DIAGNOSIS — I10 Essential (primary) hypertension: Secondary | ICD-10-CM

## 2016-12-08 DIAGNOSIS — R000 Tachycardia, unspecified: Secondary | ICD-10-CM | POA: Diagnosis not present

## 2016-12-08 DIAGNOSIS — I341 Nonrheumatic mitral (valve) prolapse: Secondary | ICD-10-CM

## 2016-12-08 MED ORDER — HYDROCHLOROTHIAZIDE 12.5 MG PO CAPS: 12.5000 mg | ORAL_CAPSULE | ORAL | 3 refills | Status: DC

## 2016-12-08 NOTE — Progress Notes (Signed)
Patient ID: Caitlyn [name], female   DOB: November 21, 1929, 83 y.o.   MRN: [id number]    Cardiology Office Note    Date:  12/08/2016   ID:  Caitlyn [name], DOB Oct 11, 1930, MRN [id number]  PCP:  Gennette Pac, MD  Cardiologist:   Sanda Klein, MD   No chief complaint on file.   History of Present Illness:  Caitlyn [name] is a 83 y.o. female with long-standing systemic hypertension and hyperlipidemia and mild mitral valve prolapse returning for follow-up.   As always she has a lot of somatic complaints but these are related to chronic back problems and severe constipation. She had to have a disimpaction in the emergency room recently. She brings a detailed review of blood pressure checked on an almost daily basis. The vast majority of the recorded blood pressures are within the target range. Her heart rate is frequently in the 110-120s. Today in the office she has sinus rhythm with a rate of 96 bpm.   She has no known history of coronary disease with a normal nuclear stress test in 2012 and no history of previous cardiac catheterization. Beta blockers have generally worked well to control her palpitations. In the past she has had side effects with certain statins (simvastatin, Crestor) but she seems to tolerate atorvastatin reasonably well.   Past Medical History:  Diagnosis Date  . AC (acromioclavicular) joint bone spurs 2008   Back  . Anemia   . Arthritis   . Blood transfusion   . Bronchitis   . COPD (chronic obstructive pulmonary disease) ([place])   . GERD (gastroesophageal reflux disease)    uses tums  . Hepatitis    1970's  . Hyperlipidemia   . Hypertension   . Mitral valve prolapse 10/16/2013  . MVP (mitral valve prolapse)    with mild to mod MR and TR 04/2012; followed by Dr. Rollene Fare [facility])  . Osteoporosis   . Sleep apnea    no treatment at this time, had sleep study apprx 8 years ago    Past Surgical History:  Procedure Laterality Date  . ABDOMINAL HYSTERECTOMY  1964    . ANTERIOR CERVICAL DECOMP/DISCECTOMY FUSION    . [place]  . HERNIA REPAIR    . INGUINAL HERNIA REPAIR  06/16/2012   Procedure: HERNIA REPAIR INGUINAL ADULT;  Surgeon: Madilyn Hook, DO;  Location: [facility];  Service: General;  Laterality: Right;  . KNEE ARTHROSCOPY  2007 & 2008  . NM MYOCAR PERF WALL MOTION  05/27/2011   normal  . SPINE SURGERY    . TONGUE BIOPSY  2000  . US ECHOCARDIOGRAPHY  06/07/2012   mild concentric LVH,LA mod. dilated,RA is mild to mod. dilated, mild MVP,prolapse of the posterior leaflet,mild mitral annular ca+,mild to mod MR & TR, AOV mildly sclerotic, aortic root sclerosis/ca+, insignificant pericardial effusion.  Marland Kitchen UVULECTOMY      Outpatient Medications Prior to Visit  Medication Sig Dispense Refill  . aspirin 81 MG tablet Take 81 mg by mouth daily.    Marland Kitchen atorvastatin (LIPITOR) 20 MG tablet TAKE 1 TABLET (20 MG TOTAL) BY MOUTH DAILY. 90 tablet 2  . Cholecalciferol (VITAMIN D3) 2000 UNITS TABS Take 2 capsules by mouth daily.    . Coenzyme Q10 (CO Q 10) 100 MG CAPS Take 100 mg by mouth daily.    . cycloSPORINE (RESTASIS) 0.05 % ophthalmic emulsion Place 1 drop into both eyes daily.    . diphenhydramine-acetaminophen (TYLENOL PM) 25-500 MG TABS tablet  Take 1 tablet by mouth at bedtime as needed.    . Ibuprofen (ADVIL PO) Take 1 capsule by mouth daily.    . Iron-Vitamins (GERITOL COMPLETE) TABS Take 1 tablet by mouth daily.    Marland Kitchen latanoprost (XALATAN) 0.005 % ophthalmic solution Place 1 drop into the right eye daily.    . timolol (TIMOPTIC) 0.5 % ophthalmic solution Place 1 drop into the right eye at bedtime.    . hydrochlorothiazide (MICROZIDE) 12.5 MG capsule TAKE 1 CAPSULE (12.5 MG TOTAL) BY MOUTH DAILY. 90 capsule 3  . antiseptic oral rinse (BIOTENE) LIQD 15 mLs by Mouth Rinse route as needed for dry mouth.    . bisacodyl (DULCOLAX) 5 MG EC tablet Take 5 mg by mouth daily as needed for moderate constipation.    . Ibuprofen-Diphenhydramine Cit  (ADVIL PM PO) Take 1 tablet by mouth every evening.     No facility-administered medications prior to visit.      Allergies:   Latex and Statins   Social History   Social History  . Marital status: Married    Spouse name: N/A  . Number of children: N/A  . Years of education: N/A   Social History Main Topics  . Smoking status: Never Smoker  . Smokeless tobacco: Never Used  . Alcohol use Yes     Comment: occassional  . Drug use: No  . Sexual activity: Not on file   Other Topics Concern  . Not on file   Social History Narrative  . No narrative on file     Family History:  The patient's   family history includes Alzheimer's disease in her father and mother.   ROS:   Please see the history of present illness.    ROS All other systems reviewed and are negative.   PHYSICAL EXAM:   VS:  BP 138/88 (BP Location: Right Arm, Patient Position: Sitting, Cuff Size: Normal)   Pulse 96   Ht 5\' 5"  (1.651 m)   Wt 67.1 kg (148 lb)   BMI 24.63 kg/m    GEN: Well nourished, well developed, in no acute distress  HEENT: normal  Neck: no JVD, carotid bruits, or masses Cardiac: RRR; no murmurs, rubs, or gallops,no edema  Respiratory:  clear to auscultation bilaterally, normal work of breathing GI: soft, nontender, nondistended, + BS MS: no deformity or atrophy  Skin: warm and dry, no rash Neuro:  Alert and Oriented x 3, Strength and sensation are intact Psych: euthymic mood, full affect  Wt Readings from Last 3 Encounters:  12/08/16 67.1 kg (148 lb)  12/03/15 69.2 kg (152 lb 8 oz)  10/31/15 71.7 kg (158 lb)      Studies/Labs Reviewed:   EKG:  EKG is ordered today.  The ekg ordered today demonstrates borderline sinus tachycardia, otherwise normal tracing With QTC 427 ms  Recent Labs: No results found for requested labs within last 8760 hours.   Lipid Panel    Component Value Date/Time   CHOL 163 11/27/2015 1125   TRIG 66 11/27/2015 1125   HDL 92 11/27/2015 1125    CHOLHDL 1.8 11/27/2015 1125   VLDL 13 11/27/2015 1125   LDLCALC 58 11/27/2015 1125     ASSESSMENT:    1. Essential hypertension   2. Mitral valve prolapse      PLAN:  In order of problems listed above:  1. HTN: Allowing for episodic increases in blood pressure related to back pain, her blood pressure control appears Excellent 2. HLP: Excellent lipid  profile on current regimen 3. MVP:  asymptomatic at this time, had mild posterior leaflet prolapse with mild-moderate insufficiency. Dilated left atrium, normal left ventricular size and function by echo. 4. Tachycardia: Suspect this is reactive sinus tachycardia related to her pain, also possibly secondary to chronic use of a thiazide diuretic and mild hypovolemia. We'll gradually discontinue the diuretic. I suspect her blood pressure remained normal despite it. She has lost a lot of weight since her initial diagnosis of hypertension.    Medication Adjustments/Labs and Tests Ordered: Current medicines are reviewed at length with the patient today.  Concerns regarding medicines are outlined above.  Medication changes, Labs and Tests ordered today are listed in the Patient Instructions below. Patient Instructions  Dr Sallyanne Kuster has recommended making the following medication changes: 1. DECREASE Hydrochlorothiazide to 3 days a week  Your physician recommends that you schedule a follow-up appointment in 1 year. You will receive a reminder letter in the mail two months in advance. If you don't receive a letter, please call our office to schedule the follow-up appointment.  If you need a refill on your cardiac medications before your next appointment, please call your pharmacy.   Dr C has requested that you regularly monitor and record your blood pressure readings at home for the next 2-3 weeks. Please use the same machine at the same time of day to check your readings and record them. Please send [place] at least 1 weeks worth of these blood  pressure readings. You can call the office to speak with a nurse, mail them in, or send them via mychart.      Signed, Sanda Klein, MD  12/08/2016 5:57 PM    [facility] [address] [place], [place], [place]  [address] Phone: [phone]; Fax: [phone]

## 2016-12-08 NOTE — Patient Instructions (Addendum)
Dr Sallyanne Kuster has recommended making the following medication changes: 1. DECREASE Hydrochlorothiazide to 3 days a week  Your physician recommends that you schedule a follow-up appointment in 1 year. You will receive a reminder letter in the mail two months in advance. If you don't receive a letter, please call our office to schedule the follow-up appointment.  If you need a refill on your cardiac medications before your next appointment, please call your pharmacy.   Dr C has requested that you regularly monitor and record your blood pressure readings at home for the next 2-3 weeks. Please use the same machine at the same time of day to check your readings and record them. Please send [place] at least 1 weeks worth of these blood pressure readings. You can call the office to speak with a nurse, mail them in, or send them via mychart.

## 2016-12-10 ENCOUNTER — Encounter: Payer: Self-pay | Admitting: Cardiovascular Disease

## 2016-12-10 ENCOUNTER — Encounter (INDEPENDENT_AMBULATORY_CARE_PROVIDER_SITE_OTHER): Payer: Medicare Other | Admitting: Physical Medicine and Rehabilitation

## 2016-12-15 ENCOUNTER — Encounter (INDEPENDENT_AMBULATORY_CARE_PROVIDER_SITE_OTHER): Payer: Medicare Other | Admitting: Physical Medicine and Rehabilitation

## 2016-12-16 ENCOUNTER — Telehealth: Payer: Self-pay | Admitting: Cardiovascular Disease

## 2016-12-16 NOTE — Telephone Encounter (Signed)
Fax received, Caitlyn [name] has this in hand for provider review.  Spoke to patient and informed her that yes, her fax was received, will give Dr. Sallyanne Kuster the opportunity to thoroughly review her concerns and will return call with recommendations. Patient expressed thanks.

## 2016-12-16 NOTE — Telephone Encounter (Signed)
If I understand the message correctly she would like to continue taking the hydrochlorothiazide on a daily basis and I think that's perfectly fine. We will also correct the inaccuracies in her medicine list that she pointed out. Please send [place] some blood pressure recordings in about a week or 2 when things settle down.

## 2016-12-16 NOTE — Telephone Encounter (Signed)
Spoke to patient, states she is going to resend fax to our office detailing her concerns. Please note she sent this to [phone] number yesterday, will resend to [facility] clinical fax today.

## 2016-12-16 NOTE — Telephone Encounter (Signed)
Follow Up    Did you received fax about her concerns with medication

## 2016-12-16 NOTE — Telephone Encounter (Signed)
Pt is having a lot of problems with her medicine. She said she sent him a long letter yesterday.She needs to talk to somebody today about this.

## 2016-12-17 NOTE — Addendum Note (Signed)
Addended by: Diana Eves on: 12/17/2016 12:31 PM   Modules accepted: Orders

## 2016-12-17 NOTE — Telephone Encounter (Signed)
Medication list updated per patient's corrected list.

## 2016-12-17 NOTE — Telephone Encounter (Signed)
I communicated these recommendations to her. She was very pleased w this plan, voices she will call if new concerns. Wished her a happy birthday and advised to call w BP trends if she is worried.

## 2016-12-24 ENCOUNTER — Telehealth (INDEPENDENT_AMBULATORY_CARE_PROVIDER_SITE_OTHER): Payer: Self-pay | Admitting: Orthopaedic Surgery

## 2016-12-24 NOTE — Telephone Encounter (Signed)
Patient wanted to know if we received a 12 page fax from Mrs. Dice last night around 7pm. CB # [phone]

## 2016-12-25 ENCOUNTER — Telehealth (INDEPENDENT_AMBULATORY_CARE_PROVIDER_SITE_OTHER): Payer: Self-pay | Admitting: Orthopaedic Surgery

## 2016-12-25 NOTE — Telephone Encounter (Signed)
Pt called again requesting a call back about some kind of paperwork we should have. She asked to speak to someone else because Dr. Lorin Mercy is out. Pt also asked if she can speak to [place]. She requested a call back ASAP.  [phone]

## 2016-12-25 NOTE — Telephone Encounter (Signed)
I have now found the fax, I will send message and discuss with Lorin Mercy when he is back in the office so he can review her fax.

## 2016-12-25 NOTE — Telephone Encounter (Signed)
IC to discuss, see other msg

## 2016-12-25 NOTE — Telephone Encounter (Signed)
IC patient and discussed, I cannot find the fax she sent.  She faxed office notes from other providers.  She had recently taken a dosepak and felt wonderful while on it, no pain whatsoever.  She wants to see if Dr Lorin Mercy will put her on low dose prednisone.  I will wait for her to refax to [phone], and make sure these notes get to Dr Lorin Mercy for review next week when he is back in office.  Patient is aware it will be next Tuesday before he can review the notes/answer her questions.

## 2016-12-30 NOTE — Telephone Encounter (Signed)
I called. Not a good idea.  Will make her bones weak etc  . She is getting ready to get hemorrhoid surgery which is bothering her more than her back at this point.

## 2016-12-30 NOTE — Telephone Encounter (Signed)
See notes.  I have put fax on your desk in clinic area to review.

## 2017-01-09 LAB — HM DIABETES EYE EXAM

## 2017-01-11 ENCOUNTER — Other Ambulatory Visit: Payer: Self-pay | Admitting: Cardiovascular Disease

## 2017-01-12 ENCOUNTER — Ambulatory Visit (INDEPENDENT_AMBULATORY_CARE_PROVIDER_SITE_OTHER): Payer: Medicare Other | Admitting: Ophthalmology

## 2017-01-12 DIAGNOSIS — H35342 Macular cyst, hole, or pseudohole, left eye: Secondary | ICD-10-CM | POA: Diagnosis not present

## 2017-01-12 DIAGNOSIS — H43813 Vitreous degeneration, bilateral: Secondary | ICD-10-CM | POA: Diagnosis not present

## 2017-01-12 DIAGNOSIS — I10 Essential (primary) hypertension: Secondary | ICD-10-CM

## 2017-01-12 DIAGNOSIS — H348312 Tributary (branch) retinal vein occlusion, right eye, stable: Secondary | ICD-10-CM | POA: Diagnosis not present

## 2017-01-12 DIAGNOSIS — H35033 Hypertensive retinopathy, bilateral: Secondary | ICD-10-CM | POA: Diagnosis not present

## 2017-01-14 ENCOUNTER — Telehealth: Payer: Self-pay | Admitting: Cardiovascular Disease

## 2017-01-14 NOTE — Telephone Encounter (Signed)
Pt wants Dr C to know that Dr Zigmund Daniel or Dr Jerline Pain, her eye docto,r is sending a report over to him today. Please evaluate this report and let her know if she needs to be seen.If he want to just talk to her,have him to call her please.

## 2017-01-14 NOTE — Telephone Encounter (Signed)
Spoke with patient and she stated that she spoke with Dr Jerline Pain and information should be coming She also sent a fax herself Patient aware Dr Sallyanne Kuster out of the office until 01/27/17 but would like for him to review personally  Will forward to [facility] so she can be looking for information

## 2017-01-16 NOTE — Telephone Encounter (Signed)
New message    pt verbalized that she is calling to speak to rn    She wants to go over medications and which ones to take

## 2017-01-16 NOTE — Telephone Encounter (Signed)
Patient states she was told to take hctz on MWF only She had a minor surgery r/t fecal impaction on Feb 24 - her BP went up She has now increased her hctz to daily (a call came in regarding this on 2/6) She states she sent this all over in "black and white" for MD to review She saw Dr. Zigmund Daniel - retina specialist - worried it could be due to her high BP  Her BP has been running 135/80s Highest BP was 142/82 with HR 101 Her HR has been running low 100s  Advised her to continue to take her hctz as she has been (daily) and the message will be forwarded to Dr. Adline Mango, CMA to review/advise

## 2017-01-19 NOTE — Telephone Encounter (Signed)
OK. Please correct the dosage in med list. MCr

## 2017-01-19 NOTE — Telephone Encounter (Signed)
Med list has been updated.   LM for patient that MD has OK'ed her to take hctz daily instead of 3x/week

## 2017-02-04 ENCOUNTER — Encounter (INDEPENDENT_AMBULATORY_CARE_PROVIDER_SITE_OTHER): Payer: Self-pay | Admitting: Physical Medicine and Rehabilitation

## 2017-02-04 ENCOUNTER — Ambulatory Visit (INDEPENDENT_AMBULATORY_CARE_PROVIDER_SITE_OTHER): Payer: Medicare Other

## 2017-02-04 ENCOUNTER — Ambulatory Visit (INDEPENDENT_AMBULATORY_CARE_PROVIDER_SITE_OTHER): Payer: Medicare Other | Admitting: Physical Medicine and Rehabilitation

## 2017-02-04 VITALS — BP 137/84 | HR 102 | Temp 98.0°F

## 2017-02-04 DIAGNOSIS — M5416 Radiculopathy, lumbar region: Secondary | ICD-10-CM

## 2017-02-04 MED ORDER — METHYLPREDNISOLONE ACETATE 80 MG/ML IJ SUSP
80.0000 mg | Freq: Once | INTRAMUSCULAR | Status: AC
  Administered 2017-02-04: 80 mg

## 2017-02-04 MED ORDER — LIDOCAINE HCL (PF) 1 % IJ SOLN
0.3300 mL | Freq: Once | INTRAMUSCULAR | Status: AC
  Administered 2017-02-04: 0.3 mL

## 2017-02-04 NOTE — Patient Instructions (Signed)

## 2017-02-04 NOTE — Progress Notes (Signed)
Caitlyn [name] - 83 y.o. female MRN [id number]  Date of birth: May 26, 1930  Office Visit Note: Visit Date: 02/04/2017 PCP: Gennette Pac, MD Referred by: Hulan Fess, MD  Subjective: Chief Complaint  Patient presents with  . Lower Back - Pain   HPI: Caitlyn [name] is an 83 year old female with chronic worsening right side low back. Depends on movement. Worse with bending, walking, and standing. States when it gets bad she just has to lay down. Dr. Lorin Mercy at request a right L5-S1 interlaminar epidural steroid injection.    ROS Otherwise per HPI.  Assessment & Plan: Visit Diagnoses:  1. Lumbar radiculopathy     Plan: Findings:  Right L5-S1 interlaminar epidural steroid injection.    Meds & Orders:  Meds ordered this encounter  Medications  . lidocaine (PF) (XYLOCAINE) 1 % injection 0.3 mL  . methylPREDNISolone acetate (DEPO-MEDROL) injection 80 mg    Orders Placed This Encounter  Procedures  . XR C-ARM NO REPORT  . Epidural Steroid injection    Follow-up: Return if symptoms worsen or fail to improve, for Dr. Lorin Mercy.   Procedures: No procedures performed  Lumbar Epidural Steroid Injection - Interlaminar Approach with Fluoroscopic Guidance  Patient: Caitlyn [name]      Date of Birth: 1930-10-05 MRN: [id number] PCP: Gennette Pac, MD      Visit Date: 02/04/2017   Universal Protocol:    Date/Time: 03/30/186:06 AM  Consent Given By: the patient  Position: PRONE  Additional Comments: Vital signs were monitored before and after the procedure. Patient was prepped and draped in the usual sterile fashion. The correct patient, procedure, and site was verified.   Injection Procedure Details:  Procedure Site One Meds Administered:  Meds ordered this encounter  Medications  . lidocaine (PF) (XYLOCAINE) 1 % injection 0.3 mL  . methylPREDNISolone acetate (DEPO-MEDROL) injection 80 mg     Laterality: Right  Location/Site:  L5-S1  Needle size: 20  G  Needle type: Tuohy  Needle Placement: Paramedian epidural  Findings:  -Contrast Used: 1 mL iohexol 180 mg iodine/mL   -Comments: Excellent flow of contrast into the epidural space.  Procedure Details: Using a paramedian approach from the side mentioned above, the region overlying the inferior lamina was localized under fluoroscopic visualization and the soft tissues overlying this structure were infiltrated with 4 ml. of 1% Lidocaine without Epinephrine. The Tuohy needle was inserted into the epidural space using a paramedian approach.   The epidural space was localized using loss of resistance along with lateral and bi-planar fluoroscopic views.  After negative aspirate for air, blood, and CSF, a 2 ml. volume of Isovue-250 was injected into the epidural space and the flow of contrast was observed. Radiographs were obtained for documentation purposes.    The injectate was administered into the level noted above.   Additional Comments:  The patient tolerated the procedure well Dressing: Band-Aid    Post-procedure details: Patient was observed during the procedure. Post-procedure instructions were reviewed.  Patient left the clinic in stable condition.    Clinical History: No specialty comments available.  She reports that she has never smoked. She has never used smokeless tobacco. No results for input(s): HGBA1C, LABURIC in the last 8760 hours.  Objective:  VS:  HT:    WT:   BMI:     BP:137/84  HR:(!) 102bpm  TEMP:98 F (36.7 C)(Oral)  RESP:99 % Physical Exam  Musculoskeletal:  Patient has good distal strength without clonus.    Ortho Exam Imaging: No  results found.  Past Medical/Family/Surgical/Social History: Medications & Allergies reviewed per EMR Patient Active Problem List   Diagnosis Date Noted  . Hypertensive retinopathy 12/04/2015  . Chest pain with moderate risk for cardiac etiology 10/15/2015  . Scoliosis deformity of spine 10/15/2015  .  Hyperlipidemia 10/16/2013  . Essential hypertension 10/16/2013  . Mitral valve prolapse 10/16/2013   Past Medical History:  Diagnosis Date  . AC (acromioclavicular) joint bone spurs 2008   Back  . Anemia   . Arthritis   . Blood transfusion   . Bronchitis   . COPD (chronic obstructive pulmonary disease) ([place])   . GERD (gastroesophageal reflux disease)    uses tums  . Hepatitis    1970's  . Hyperlipidemia   . Hypertension   . Mitral valve prolapse 10/16/2013  . MVP (mitral valve prolapse)    with mild to mod MR and TR 04/2012; followed by Dr. Rollene Fare [facility])  . Osteoporosis   . Sleep apnea    no treatment at this time, had sleep study apprx 8 years ago   Family History  Problem Relation Age of Onset  . Alzheimer's disease Mother   . Alzheimer's disease Father    Past Surgical History:  Procedure Laterality Date  . ABDOMINAL HYSTERECTOMY  1964  . ANTERIOR CERVICAL DECOMP/DISCECTOMY FUSION    . [place]  . HERNIA REPAIR    . INGUINAL HERNIA REPAIR  06/16/2012   Procedure: HERNIA REPAIR INGUINAL ADULT;  Surgeon: Madilyn Hook, DO;  Location: [place];  Service: General;  Laterality: Right;  . KNEE ARTHROSCOPY  2007 & 2008  . NM MYOCAR PERF WALL MOTION  05/27/2011   normal  . SPINE SURGERY    . TONGUE BIOPSY  2000  . US ECHOCARDIOGRAPHY  06/07/2012   mild concentric LVH,LA mod. dilated,RA is mild to mod. dilated, mild MVP,prolapse of the posterior leaflet,mild mitral annular ca+,mild to mod MR & TR, AOV mildly sclerotic, aortic root sclerosis/ca+, insignificant pericardial effusion.  Marland Kitchen UVULECTOMY     Social History   Occupational History  . Not on file.   Social History Main Topics  . Smoking status: Never Smoker  . Smokeless tobacco: Never Used  . Alcohol use Yes     Comment: occassional  . Drug use: No  . Sexual activity: Not on file

## 2017-02-06 NOTE — Procedures (Signed)
Lumbar Epidural Steroid Injection - Interlaminar Approach with Fluoroscopic Guidance  Patient: Caitlyn [name]      Date of Birth: 11-01-1930 MRN: [id number] PCP: Gennette Pac, MD      Visit Date: 02/04/2017   Universal Protocol:    Date/Time: 03/30/186:06 AM  Consent Given By: the patient  Position: PRONE  Additional Comments: Vital signs were monitored before and after the procedure. Patient was prepped and draped in the usual sterile fashion. The correct patient, procedure, and site was verified.   Injection Procedure Details:  Procedure Site One Meds Administered:  Meds ordered this encounter  Medications  . lidocaine (PF) (XYLOCAINE) 1 % injection 0.3 mL  . methylPREDNISolone acetate (DEPO-MEDROL) injection 80 mg     Laterality: Right  Location/Site:  L5-S1  Needle size: 20 G  Needle type: Tuohy  Needle Placement: Paramedian epidural  Findings:  -Contrast Used: 1 mL iohexol 180 mg iodine/mL   -Comments: Excellent flow of contrast into the epidural space.  Procedure Details: Using a paramedian approach from the side mentioned above, the region overlying the inferior lamina was localized under fluoroscopic visualization and the soft tissues overlying this structure were infiltrated with 4 ml. of 1% Lidocaine without Epinephrine. The Tuohy needle was inserted into the epidural space using a paramedian approach.   The epidural space was localized using loss of resistance along with lateral and bi-planar fluoroscopic views.  After negative aspirate for air, blood, and CSF, a 2 ml. volume of Isovue-250 was injected into the epidural space and the flow of contrast was observed. Radiographs were obtained for documentation purposes.    The injectate was administered into the level noted above.   Additional Comments:  The patient tolerated the procedure well Dressing: Band-Aid    Post-procedure details: Patient was observed during the  procedure. Post-procedure instructions were reviewed.  Patient left the clinic in stable condition.

## 2017-02-20 ENCOUNTER — Institutional Professional Consult (permissible substitution): Payer: Medicare Other | Admitting: Internal Medicine

## 2017-03-06 ENCOUNTER — Institutional Professional Consult (permissible substitution): Payer: Medicare Other | Admitting: Internal Medicine

## 2017-03-16 ENCOUNTER — Institutional Professional Consult (permissible substitution): Payer: Medicare Other | Admitting: Internal Medicine

## 2017-04-09 ENCOUNTER — Other Ambulatory Visit: Payer: Self-pay | Admitting: *Deleted

## 2017-04-09 MED ORDER — HYDROCHLOROTHIAZIDE 12.5 MG PO CAPS: 12.5000 mg | ORAL_CAPSULE | Freq: Every day | ORAL | 8 refills | Status: DC

## 2017-04-09 NOTE — Telephone Encounter (Signed)
Pt requested refill on HCTZ - I have sent this electronically to her preferred pharmacy.

## 2017-04-27 ENCOUNTER — Telehealth (INDEPENDENT_AMBULATORY_CARE_PROVIDER_SITE_OTHER): Payer: Self-pay | Admitting: Orthopaedic Surgery

## 2017-04-27 NOTE — Telephone Encounter (Signed)
Patient called asked if she can be worked into Dr Lorin Mercy schedule as soon as possible. Patient said the last injection she had did not work. The number to contact patient is [phone]

## 2017-04-28 ENCOUNTER — Telehealth (INDEPENDENT_AMBULATORY_CARE_PROVIDER_SITE_OTHER): Payer: Self-pay | Admitting: Physical Medicine and Rehabilitation

## 2017-04-28 NOTE — Telephone Encounter (Signed)
Yes x 1 

## 2017-04-28 NOTE — Telephone Encounter (Signed)
Scheduled for 6/28 at 1330. Patient will call back to confirm.

## 2017-04-29 ENCOUNTER — Telehealth: Payer: Self-pay | Admitting: Cardiovascular Disease

## 2017-04-29 NOTE — Telephone Encounter (Signed)
Patient will keep appointment as scheduled.

## 2017-04-29 NOTE — Telephone Encounter (Signed)
Returned call to patient She states her BP medications were adjusted in Jan 2018 BP had been running 140s and has since decreased (d/t relief from GI issues) She states she is "tired all the time" but is unsure if this is r/t pain Confirmed patient is taking hctz 12.5mg  QD She states she sent an email to MD w/2 readings but there are no MyChart messages regarding this  -- informed her that I cannot pull these "emails" for reference  -- she expressed frustration that she has not heard back on these readings BP this AM was 104/68  HR 86 - recheck 99/68  HR 89  Patient is checking her BP & HR 4x/daily  -- advised is OK to check 2x daily but she is adamant that she check 4x as MD advised Informed patient that her BP is normal but should she have consistent readings under 428 systolic and/or lightheadedness, dizziness, pre-syncope, then she should call our office Offered a HTN clinic appt, but patient states she is not seeing a pharmacist and would only seen an MD Informed patient she can send her BP readings via MyChart and she states she does not have time to do this  Advised patient to continue monitoring her VS as she was directed and call if any future concerns.

## 2017-04-29 NOTE — Telephone Encounter (Signed)
New message   Pt C/O BP issue: STAT if pt C/O blurred vision, one-sided weakness or slurred speech  1. What are your last 5 BP readings? Just now  99/68 pulse 89   2. Are you having any other symptoms (ex. Dizziness, headache, blurred vision, passed out)? No    3. What is your BP issue? Wants to discuss with the MD personal on medication adjustment.

## 2017-05-04 NOTE — Telephone Encounter (Signed)
I called patient and offered slot for Friday where another patient had canceled. She declined stating that she is going to come back and see Dr. Ernestina Patches for another injection.

## 2017-05-07 ENCOUNTER — Encounter (INDEPENDENT_AMBULATORY_CARE_PROVIDER_SITE_OTHER): Payer: Self-pay | Admitting: Physical Medicine and Rehabilitation

## 2017-05-07 ENCOUNTER — Ambulatory Visit (INDEPENDENT_AMBULATORY_CARE_PROVIDER_SITE_OTHER): Payer: Medicare Other | Admitting: Physical Medicine and Rehabilitation

## 2017-05-07 ENCOUNTER — Ambulatory Visit (INDEPENDENT_AMBULATORY_CARE_PROVIDER_SITE_OTHER): Payer: Self-pay

## 2017-05-07 VITALS — BP 139/81 | HR 87 | Temp 98.1°F

## 2017-05-07 DIAGNOSIS — M961 Postlaminectomy syndrome, not elsewhere classified: Secondary | ICD-10-CM | POA: Diagnosis not present

## 2017-05-07 DIAGNOSIS — M5416 Radiculopathy, lumbar region: Secondary | ICD-10-CM | POA: Diagnosis not present

## 2017-05-07 DIAGNOSIS — M48062 Spinal stenosis, lumbar region with neurogenic claudication: Secondary | ICD-10-CM

## 2017-05-07 DIAGNOSIS — M419 Scoliosis, unspecified: Secondary | ICD-10-CM

## 2017-05-07 MED ORDER — LIDOCAINE HCL (PF) 1 % IJ SOLN
2.0000 mL | Freq: Once | INTRAMUSCULAR | Status: AC
  Administered 2017-05-07: 2 mL

## 2017-05-07 MED ORDER — METHYLPREDNISOLONE ACETATE 80 MG/ML IJ SUSP
80.0000 mg | Freq: Once | INTRAMUSCULAR | Status: AC
  Administered 2017-05-07: 80 mg

## 2017-05-07 NOTE — Patient Instructions (Signed)

## 2017-05-07 NOTE — Progress Notes (Deleted)
Patient states she had some relief with last injection for around 1 month. She states pain is on the right side like before but having pain around waist line on both side that comes and goes. Having numbness bottom of both feet.

## 2017-05-08 NOTE — Progress Notes (Signed)
Caitlyn [name] - 83 y.o. female MRN [id number]  Date of birth: 31-May-1930  Office Visit Note: Visit Date: 05/07/2017 PCP: Hulan Fess, MD Referred by: Hulan Fess, MD  Subjective: Chief Complaint  Patient presents with  . Lower Back - Pain   HPI: Caitlyn [name] is an 83 year old female mainly followed by Dr. Inda Merlin in our office with history of multiple orthopedic problems. From a spine standpoint Caitlyn [name] has known scoliosis with significant severe central canal multifactorial stenosis at L4-5 and right foraminal narrowing at L5-S1. Caitlyn [name]'s done well in the past with intralaminar injection at L5-S1 mainly for the right-sided symptoms. He completed an epidural injection in March with good relief but only for a month. I see her very infrequently for injections and they have helped her in the past. This wound is to the seem to help very long. Caitlyn [name] comments that Caitlyn [name] really didn't feel the injection like Caitlyn [name] normally does. I tried to tell her real really have to feel the injection necessarily it did look well placing we did have the images to review. Nonetheless Caitlyn [name] is having worsening symptoms with standing and ambulating better at rest. Caitlyn [name] doesn't seem to slow down Caitlyn [name] does a lot of work around the house. Caitlyn [name] [facility] and they did tell he would need an MRI to evaluate her case. I did tell her Caitlyn [name] had an MRI from 2016. Caitlyn [name] seemingly was unaware of this. I did print out of report for her. Caitlyn [name] has not had any bowel or bladder changes or focal weakness. Caitlyn [name] has felt a little bit weak and unsteady. Caitlyn [name] has had pain in the right more than left but bilateral. Pain in the back and buttock region and hip. Caitlyn [name]'s had no falls recently no trauma. Symptoms are better at rest.    Review of Systems  Constitutional: Negative for chills, fever, malaise/fatigue and weight loss.  HENT: Negative for hearing loss and sinus pain.   Eyes: Negative for blurred vision, double vision and  photophobia.  Respiratory: Negative for cough and shortness of breath.   Cardiovascular: Negative for chest pain, palpitations and leg swelling.  Gastrointestinal: Negative for abdominal pain, nausea and vomiting.  Genitourinary: Negative for flank pain.  Musculoskeletal: Positive for back pain. Negative for myalgias.  Skin: Negative for itching and rash.  Neurological: Negative for tremors, focal weakness and weakness.  Endo/Heme/Allergies: Negative.   Psychiatric/Behavioral: Negative for depression.  All other systems reviewed and are negative.  Otherwise per HPI.  Assessment & Plan: Visit Diagnoses:  1. Lumbar radiculopathy   2. Spinal stenosis of lumbar region with neurogenic claudication   3. Post laminectomy syndrome   4. Scoliosis of thoracolumbar spine, unspecified scoliosis type     Plan: Findings:  Chronic worsening low back pain right more than left hip and leg pain. Or back pain on the left and leg pain. Pain is multifactorial with worsening with standing and going from sit to stand. Pain is likely combination of facet arthropathy as well as stenosis. Caitlyn [name] has foraminal and central canal narrowing particularly on the right. Caitlyn [name] also has some myofascial pain. Caitlyn [name] does not carry a diagnosis of fibromyalgia. Are going to complete bilateral L4 transforaminal injections today to see if that gives her better long standing relief in the interlaminar epidural. Caitlyn [name] is free to see [facility] people and Caitlyn [name] does have an MRI print out now. Caitlyn [name] was informed that Caitlyn [name] could get the CD from [facility]  imaging Caitlyn [name] says that Caitlyn [name] has a friend and the compression help her with that. Caitlyn [name] likely is not a great surgical candidate given her degree of scoliosis. I spent more than 25 minutes speaking face-to-face with the patient with 50% of the time in counseling.    Meds & Orders:  Meds ordered this encounter  Medications  . lidocaine (PF) (XYLOCAINE) 1 % injection 2 mL  .  methylPREDNISolone acetate (DEPO-MEDROL) injection 80 mg    Orders Placed This Encounter  Procedures  . XR C-ARM NO REPORT  . Epidural Steroid injection    Follow-up: Return if symptoms worsen or fail to improve, for Dr. Lorin Mercy.   Procedures: No procedures performed  Lumbosacral Transforaminal Epidural Steroid Injection - Infraneural Approach with Fluoroscopic Guidance  Patient: Caitlyn [name]      Date of Birth: [birth year] MRN: [id number] PCP: Hulan Fess, MD      Visit Date: 05/07/2017   Universal Protocol:     Consent Given By: the patient  Position: PRONE   Additional Comments: Vital signs were monitored before and after the procedure. Patient was prepped and draped in the usual sterile fashion. The correct patient, procedure, and site was verified.   Injection Procedure Details:  Procedure Site One Meds Administered:  Meds ordered this encounter  Medications  . lidocaine (PF) (XYLOCAINE) 1 % injection 2 mL  . methylPREDNISolone acetate (DEPO-MEDROL) injection 80 mg      Laterality: Bilateral  Location/Site:  L4-L5  Needle size: 22 G  Needle type: Spinal  Needle Placement: Transforaminal  Findings:  -Contrast Used: 1 mL iohexol 180 mg iodine/mL   -Comments: Excellent flow of contrast along the nerve and into the epidural space.  Procedure Details: After squaring off the end-plates of the desired vertebral level to get a true AP view, the C-arm was obliqued to the painful side so that the superior articulating process is positioned about 1/3 the length of the inferior endplate.  The needle was aimed toward the junction of the superior articular process and the transverse process of the inferior vertebrae. The needle's initial entry is in the lower third of the foramen through Kambin's triangle. The soft tissues overlying this target were infiltrated with 2-3 ml. of 1% Lidocaine without Epinephrine.  The spinal needle was then inserted and advanced toward  the target using a "trajectory" view along the fluoroscope beam.  Under AP and lateral visualization, the needle was advanced so it did not puncture dura and did not traverse medially beyond the 6 o'clock position of the pedicle. Bi-planar projections were used to confirm position. Aspiration was confirmed to be negative for CSF and/or blood. A 1-2 ml. volume of Isovue-250 was injected and flow of contrast was noted at each level. Radiographs were obtained for documentation purposes.   After attaining the desired flow of contrast documented above, a 0.5 to 1.0 ml test dose of 0.25% Marcaine was injected into each respective transforaminal space.  The patient was observed for 90 seconds post injection.  After no sensory deficits were reported, and normal lower extremity motor function was noted,   the above injectate was administered so that equal amounts of the injectate were placed at each foramen (level) into the transforaminal epidural space.   Additional Comments:  The patient tolerated the procedure well Dressing: Band-Aid    Post-procedure details: Patient was observed during the procedure. Post-procedure instructions were reviewed.  Patient left the clinic in stable condition.   Clinical History: MRI LUMBAR SPINE WITHOUT AND  WITH CONTRAST  TECHNIQUE: Multiplanar and multiecho pulse sequences of the lumbar spine were obtained without and with intravenous contrast.  CONTRAST:  51mL MULTIHANCE GADOBENATE DIMEGLUMINE 529 MG/ML IV SOLN  COMPARISON:  CT abdomen and pelvis 10/19/2012.  FINDINGS: Segmentation: Normal.  Alignment: 5 mm anterolisthesis L4-5. 3 mm anterolisthesis L3-4. 2 mm retrolisthesis L2-3 and L1-2. Marked scoliosis convex RIGHT estimated 40 degrees.  Vertebrae: Endplate reactive changes above and below L1-2 and L2-3. Old compression fracture L1 with Schmorl's node involving the superior endplate.  Conus medullaris: Normal in size, signal, and  location.  Paraspinal tissues: No evidence for hydronephrosis or paravertebral mass.  Disc levels:  L1-L2: 2 mm retrolisthesis is facet mediated. Mild to moderate disc space narrowing. Mild annular bulging. LEFT greater than RIGHT facet arthropathy. LEFT greater than RIGHT subarticular zone narrowing and foraminal zone narrowing.  L2-L3: 2 mm retrolisthesis. Mild to moderate disc space narrowing. Mild annular bulging mild annular bulging. LEFT greater than RIGHT facet arthropathy. LEFT greater than RIGHT subarticular zone narrowing foraminal zone narrowing. Mild central canal stenosis. LEFT L2 and LEFT L3 nerve root impingement.  L3-L4: Mild to moderate disc space narrowing. 3 mm anterolisthesis. Advanced facet and ligamentum flavum hypertrophy. Severe central canal stenosis. BILATERAL subarticular zone and foraminal zone narrowing affecting the L3 and L4 nerve roots.  L4-L5: Moderate to severe disc space narrowing. 5 mm anterolisthesis. Central protrusion. Moderate facet ligamentum flavum hypertrophy. Prior laminotomies. Severe central canal stenosis. BILATERAL L4 and L5 nerve root impingement.  L5-S1: Moderate disc space narrowing. Shallow central protrusion. BILATERAL facet arthropathy. RIGHT foraminal narrowing affects the L5 nerve root.  No worrisome abnormal postcontrast enhancement. Moderate enhancement in the dorsal soft tissues related to facet disease and postsurgical change, is observed at L3-4 and L4-5.  IMPRESSION: Potentially symptomatic RIGHT-sided neural impingement at L5-S1 (foraminal), L3-4, and L4-5. See comments above.  40 degree scoliosis convex RIGHT mid lumbar region.  Asymmetric subarticular zone and foraminal zone narrowing affects the LEFT-sided nerve roots at L1-2 and L2-3.   Electronically Signed   By: Rolla Flatten M.D.   On: 02/05/2015 14:37  Caitlyn [name] reports that Caitlyn [name] has never smoked. Caitlyn [name] has never used smokeless tobacco. No  results for input(s): HGBA1C, LABURIC in the last 8760 hours.  Objective:  VS:  HT:    WT:   BMI:     BP:139/81  HR:87bpm  TEMP:98.1 F (36.7 C)( )  RESP:98 % Physical Exam  Constitutional: Caitlyn [name] is oriented to person, place, and time. Caitlyn [name] appears well-developed and well-nourished.  Eyes: Conjunctivae and EOM are normal. Pupils are equal, round, and reactive to light.  Cardiovascular: Normal rate and intact distal pulses.   Pulmonary/Chest: Effort normal.  Musculoskeletal:  Patient ambulates without aid. Caitlyn [name] is slow to rise from a seated position. Caitlyn [name] has obvious rightward scoliosis on exam of the lumbar spine. Caitlyn [name] has pain and tenderness across the lumbar spine with tender points and somewhat active trigger points. No pain over the greater trochanters. Caitlyn [name] has good distal strength.  Neurological: Caitlyn [name] is alert and oriented to person, place, and time. Caitlyn [name] exhibits normal muscle tone.  Skin: Skin is warm and dry. No rash noted. No erythema.  Psychiatric: Caitlyn [name] has a normal mood and affect. Her behavior is normal.  Nursing note and vitals reviewed.   Ortho Exam Imaging: Xr C-arm No Report  Result Date: 05/07/2017 Please see Notes or Procedures tab for imaging impression.   Past Medical/Family/Surgical/Social History: Medications & Allergies reviewed per EMR Patient Active Problem List  Diagnosis Date Noted  . Hypertensive retinopathy 12/04/2015  . Chest pain with moderate risk for cardiac etiology 10/15/2015  . Scoliosis deformity of spine 10/15/2015  . Hyperlipidemia 10/16/2013  . Essential hypertension 10/16/2013  . Mitral valve prolapse 10/16/2013   Past Medical History:  Diagnosis Date  . AC (acromioclavicular) joint bone spurs 2008   Back  . Anemia   . Arthritis   . Blood transfusion   . Bronchitis   . COPD (chronic obstructive pulmonary disease) ([place])   . GERD (gastroesophageal reflux disease)    uses tums  . Hepatitis    1970's  . Hyperlipidemia   . Hypertension    . Mitral valve prolapse 10/16/2013  . MVP (mitral valve prolapse)    with mild to mod MR and TR 04/2012; followed by Dr. Rollene Fare [facility])  . Osteoporosis   . Sleep apnea    no treatment at this time, had sleep study apprx 8 years ago   Family History  Problem Relation Age of Onset  . Alzheimer's disease Mother   . Alzheimer's disease Father    Past Surgical History:  Procedure Laterality Date  . ABDOMINAL HYSTERECTOMY  1964  . ANTERIOR CERVICAL DECOMP/DISCECTOMY FUSION    . [place]  . HERNIA REPAIR    . INGUINAL HERNIA REPAIR  06/16/2012   Procedure: HERNIA REPAIR INGUINAL ADULT;  Surgeon: Madilyn Hook, DO;  Location: [place];  Service: General;  Laterality: Right;  . KNEE ARTHROSCOPY  2007 & 2008  . NM MYOCAR PERF WALL MOTION  05/27/2011   normal  . SPINE SURGERY    . TONGUE BIOPSY  2000  . US ECHOCARDIOGRAPHY  06/07/2012   mild concentric LVH,LA mod. dilated,RA is mild to mod. dilated, mild MVP,prolapse of the posterior leaflet,mild mitral annular ca+,mild to mod MR & TR, AOV mildly sclerotic, aortic root sclerosis/ca+, insignificant pericardial effusion.  Marland Kitchen UVULECTOMY     Social History   Occupational History  . Not on file.   Social History Main Topics  . Smoking status: Never Smoker  . Smokeless tobacco: Never Used  . Alcohol use Yes     Comment: occassional  . Drug use: No  . Sexual activity: Not on file

## 2017-05-08 NOTE — Procedures (Signed)
Lumbosacral Transforaminal Epidural Steroid Injection - Infraneural Approach with Fluoroscopic Guidance  Patient: Caitlyn [name]      Date of Birth: 1930-09-24 MRN: [id number] PCP: Hulan Fess, MD      Visit Date: 05/07/2017   Universal Protocol:     Consent Given By: the patient  Position: PRONE   Additional Comments: Vital signs were monitored before and after the procedure. Patient was prepped and draped in the usual sterile fashion. The correct patient, procedure, and site was verified.   Injection Procedure Details:  Procedure Site One Meds Administered:  Meds ordered this encounter  Medications  . lidocaine (PF) (XYLOCAINE) 1 % injection 2 mL  . methylPREDNISolone acetate (DEPO-MEDROL) injection 80 mg      Laterality: Bilateral  Location/Site:  L4-L5  Needle size: 22 G  Needle type: Spinal  Needle Placement: Transforaminal  Findings:  -Contrast Used: 1 mL iohexol 180 mg iodine/mL   -Comments: Excellent flow of contrast along the nerve and into the epidural space.  Procedure Details: After squaring off the end-plates of the desired vertebral level to get a true AP view, the C-arm was obliqued to the painful side so that the superior articulating process is positioned about 1/3 the length of the inferior endplate.  The needle was aimed toward the junction of the superior articular process and the transverse process of the inferior vertebrae. The needle's initial entry is in the lower third of the foramen through Kambin's triangle. The soft tissues overlying this target were infiltrated with 2-3 ml. of 1% Lidocaine without Epinephrine.  The spinal needle was then inserted and advanced toward the target using a "trajectory" view along the fluoroscope beam.  Under AP and lateral visualization, the needle was advanced so it did not puncture dura and did not traverse medially beyond the 6 o'clock position of the pedicle. Bi-planar projections were used to confirm  position. Aspiration was confirmed to be negative for CSF and/or blood. A 1-2 ml. volume of Isovue-250 was injected and flow of contrast was noted at each level. Radiographs were obtained for documentation purposes.   After attaining the desired flow of contrast documented above, a 0.5 to 1.0 ml test dose of 0.25% Marcaine was injected into each respective transforaminal space.  The patient was observed for 90 seconds post injection.  After no sensory deficits were reported, and normal lower extremity motor function was noted,   the above injectate was administered so that equal amounts of the injectate were placed at each foramen (level) into the transforaminal epidural space.   Additional Comments:  The patient tolerated the procedure well Dressing: Band-Aid    Post-procedure details: Patient was observed during the procedure. Post-procedure instructions were reviewed.  Patient left the clinic in stable condition.

## 2017-05-25 ENCOUNTER — Encounter: Payer: Self-pay | Admitting: Internal Medicine

## 2017-05-25 ENCOUNTER — Ambulatory Visit (INDEPENDENT_AMBULATORY_CARE_PROVIDER_SITE_OTHER): Payer: Medicare Other | Admitting: Internal Medicine

## 2017-05-25 VITALS — BP 126/78 | HR 116 | Ht 65.0 in | Wt 139.0 lb

## 2017-05-25 DIAGNOSIS — R938 Abnormal findings on diagnostic imaging of other specified body structures: Secondary | ICD-10-CM

## 2017-05-25 DIAGNOSIS — R9389 Abnormal findings on diagnostic imaging of other specified body structures: Secondary | ICD-10-CM

## 2017-05-25 NOTE — Assessment & Plan Note (Addendum)
Spirometry 05/25/2017  FEV1 1.57 (84%)  Ratio 68 with mild curvature c/w age not COPD  The ratio of 70% is based on a arbitrary threshold set so as no to miss young pts with early copd but clearly overdiagnoses the elderly who have a natural reduction in elasticity that would eventually lead to disability in all pt who live to be 81 years old,  At least in theory, as no one has ever lived long enough to prove it  So really does not have significant copd, has no limiting doe or tendency to aecopd, and does not need pulmonary meds or f/u but I would be happy to see her back here anytime prn  Total time devoted to counseling  > 50 % of initial 60 min office visit:  review case with pt/ discussion of options/alternatives/ personally creating written customized instructions  in presence of pt  then going over those specific  Instructions directly with the pt including how to use all of the meds but in particular covering each new medication in detail and the difference between the maintenance= "automatic" meds and the prns using an action plan format for the latter (If this problem/symptom => do that organization reading Left to right).  Please see AVS from this visit for a full list of these instructions which I personally wrote for this pt and  are unique to this visit.

## 2017-05-25 NOTE — Progress Notes (Signed)
Subjective:     Patient ID: Caitlyn [name], female   DOB: [birth year],    MRN: [id number]  HPI  42 yowf never smoker no resp issues as child/  Some seasonal rhinitis s cough / wheeze as adult/ referred to pulmonary clinic 05/25/2017 by Dr   Lawernce Pitts with cxr report c/w copd    05/25/2017 1st [facility] office visit/ [facility]   Chief Complaint  Patient presents with  . Pulmonary Consult    Referred by Dr. Hulan Fess for eval of COPD. Pt states she occ sinus drainage, but no cough or SOB.   making lost of noise x years and had uvula removed helped some but no problem coughing or breathing.  Not limited by breathing from desired activities  But by back and says will soon need    No obvious day to day or daytime variability or assoc excess/ purulent sputum or mucus plugs or hemoptysis or cp or chest tightness, subjective wheeze or overt sinus or hb symptoms. No unusual exp hx or h/o childhood pna/ asthma or knowledge of premature birth.  Sleeping ok without nocturnal  or early am exacerbation  of respiratory  c/o's or need for noct saba. Also denies any obvious fluctuation of symptoms with weather or environmental changes or other aggravating or alleviating factors except as outlined above   Current Medications, Allergies, Complete Past Medical History, Past Surgical History, Family History, and Social History were reviewed in [employer] record.  ROS  The following are not active complaints unless bolded sore throat, dysphagia, dental problems, itching, sneezing,  nasal congestion or excess/ purulent secretions, ear ache,   fever, chills, sweats, unintended wt loss, classically pleuritic or exertional cp,  orthopnea pnd or leg swelling, presyncope, palpitations, abdominal pain, anorexia, nausea, vomiting, diarrhea  or change in bowel or bladder habits, change in stools or urine, dysuria,hematuria,  rash, arthralgias, visual complaints, headache, numbness, weakness or  ataxia or problems with walking or coordination,  change in mood/affect or memory.           Review of Systems     Objective:   Physical Exam Pleasant amb wf nad  Wt Readings from Last 3 Encounters:  05/25/17 139 lb (63 kg)  12/08/16 148 lb (67.1 kg)  12/03/15 152 lb 8 oz (69.2 kg)    Vital signs reviewed   - Note on arrival 02 sats  97% on RA    HEENT: nl dentition, turbinates bilaterally, and oropharynx. Nl external ear canals without cough reflex   NECK :  without JVD/Nodes/TM/ nl carotid upstrokes bilaterally   LUNGS: no acc muscle use,  Abn contour chest  With obvious scoliosis  at least moderately severe  but s clear to A and P bilaterally without cough on insp or exp maneuvers   CV:  RRR  no s3 or murmur or increase in P2, and no edema   ABD:  soft and nontender with nl inspiratory excursion in the supine position. No bruits or organomegaly appreciated, bowel sounds nl  MS:  Nl gait/ ext warm without deformities, calf tenderness, cyanosis or clubbing No obvious joint restrictions   SKIN: warm and dry without lesions    NEURO:  alert, approp, nl sensorium with  no motor or cerebellar deficits apparent.    cxr report 12/03/16  C/w copd plus thoracolumbar scoliosis     Assessment:

## 2017-05-25 NOTE — Patient Instructions (Signed)
You do not have significant copd but rather the typical effects of aging on lung function and do not require any pulmonary medication or follow up

## 2017-07-23 ENCOUNTER — Ambulatory Visit (INDEPENDENT_AMBULATORY_CARE_PROVIDER_SITE_OTHER): Payer: Medicare Other | Admitting: Ophthalmology

## 2017-08-20 ENCOUNTER — Encounter (INDEPENDENT_AMBULATORY_CARE_PROVIDER_SITE_OTHER): Payer: Medicare Other | Admitting: Ophthalmology

## 2017-08-28 ENCOUNTER — Telehealth (INDEPENDENT_AMBULATORY_CARE_PROVIDER_SITE_OTHER): Payer: Self-pay | Admitting: Orthopaedic Surgery

## 2017-08-28 NOTE — Telephone Encounter (Signed)
°  [name],Caitlyn S 1930-06-07  Home [phone] Cell [phone]  Pt called and she would like to sched with Dr.Yates, she wanted to come in this week or next week if possible. I was going to sched her for November. She also wanted to sched an appt with Dr.Newton so I transferred her to [facility]'s line so she could sched her injection.*Scoliosis*would be the appt not for yates

## 2017-08-28 NOTE — Telephone Encounter (Signed)
I have no early appointments available for next week. Dr. Lorin Mercy is on call this weekend and all appointment slots are full. Please make patient next available appt on November 6 and tell her that she can call daily to see if there is a cancellation that she can be worked in for. Thank you.

## 2017-08-31 ENCOUNTER — Encounter (INDEPENDENT_AMBULATORY_CARE_PROVIDER_SITE_OTHER): Payer: Medicare Other | Admitting: Ophthalmology

## 2017-08-31 ENCOUNTER — Telehealth (INDEPENDENT_AMBULATORY_CARE_PROVIDER_SITE_OTHER): Payer: Self-pay | Admitting: Physical Medicine and Rehabilitation

## 2017-08-31 DIAGNOSIS — H348312 Tributary (branch) retinal vein occlusion, right eye, stable: Secondary | ICD-10-CM | POA: Diagnosis not present

## 2017-08-31 DIAGNOSIS — H35342 Macular cyst, hole, or pseudohole, left eye: Secondary | ICD-10-CM

## 2017-08-31 DIAGNOSIS — H43813 Vitreous degeneration, bilateral: Secondary | ICD-10-CM | POA: Diagnosis not present

## 2017-08-31 DIAGNOSIS — H35033 Hypertensive retinopathy, bilateral: Secondary | ICD-10-CM | POA: Diagnosis not present

## 2017-08-31 DIAGNOSIS — I10 Essential (primary) hypertension: Secondary | ICD-10-CM

## 2017-08-31 NOTE — Telephone Encounter (Signed)
Yes okay to repeat

## 2017-08-31 NOTE — Telephone Encounter (Signed)
Scheduled for 09/16/17 1315

## 2017-09-04 ENCOUNTER — Telehealth (INDEPENDENT_AMBULATORY_CARE_PROVIDER_SITE_OTHER): Payer: Self-pay | Admitting: Orthopaedic Surgery

## 2017-09-04 DIAGNOSIS — M810 Age-related osteoporosis without current pathological fracture: Secondary | ICD-10-CM

## 2017-09-04 NOTE — Telephone Encounter (Signed)
Patient states that her PCP wanted her to have an appt with you to order a Bone Density Test.  Would you like for me to make her an appt or go ahead an order the test?

## 2017-09-04 NOTE — Telephone Encounter (Signed)
Patient states her pcp Dr Hulan Fess requested that she see Dr Lorin Mercy to order a Bone Density test.

## 2017-09-04 NOTE — Telephone Encounter (Signed)
Order test. ROV 2 wks after test thanks

## 2017-09-04 NOTE — Telephone Encounter (Signed)
Order entered. I called patient and advised. She will call back once bone density is scheduled and make 2 week rov. She is going out of town next week.

## 2017-09-15 ENCOUNTER — Ambulatory Visit (INDEPENDENT_AMBULATORY_CARE_PROVIDER_SITE_OTHER): Payer: Medicare Other | Admitting: Orthopaedic Surgery

## 2017-09-16 ENCOUNTER — Ambulatory Visit (INDEPENDENT_AMBULATORY_CARE_PROVIDER_SITE_OTHER): Payer: Medicare Other

## 2017-09-16 ENCOUNTER — Ambulatory Visit (INDEPENDENT_AMBULATORY_CARE_PROVIDER_SITE_OTHER): Payer: Medicare Other | Admitting: Physical Medicine and Rehabilitation

## 2017-09-16 ENCOUNTER — Encounter (INDEPENDENT_AMBULATORY_CARE_PROVIDER_SITE_OTHER): Payer: Self-pay | Admitting: Physical Medicine and Rehabilitation

## 2017-09-16 VITALS — BP 158/92 | HR 118 | Temp 98.3°F

## 2017-09-16 DIAGNOSIS — M48062 Spinal stenosis, lumbar region with neurogenic claudication: Secondary | ICD-10-CM

## 2017-09-16 DIAGNOSIS — M5416 Radiculopathy, lumbar region: Secondary | ICD-10-CM

## 2017-09-16 MED ORDER — LIDOCAINE HCL (PF) 1 % IJ SOLN
2.0000 mL | Freq: Once | INTRAMUSCULAR | Status: AC
  Administered 2017-09-16: 2 mL

## 2017-09-16 MED ORDER — BETAMETHASONE SOD PHOS & ACET 6 (3-3) MG/ML IJ SUSP
12.0000 mg | Freq: Once | INTRAMUSCULAR | Status: AC
  Administered 2017-09-16: 12 mg

## 2017-09-16 NOTE — Progress Notes (Deleted)
Pain is at the waist line on both sides and in her back.  Last injection helped.  Normally when she is hurting she has to take an advil and go to bed. -BT's, + Driver, -Dye Allergy  Left sub pedicle and right infraneural

## 2017-09-16 NOTE — Patient Instructions (Signed)

## 2017-09-17 ENCOUNTER — Telehealth (INDEPENDENT_AMBULATORY_CARE_PROVIDER_SITE_OTHER): Payer: Self-pay | Admitting: Physical Medicine and Rehabilitation

## 2017-09-17 NOTE — Telephone Encounter (Signed)
If she calls back re-assure the note was sent to Dr. Rex Kras according to [facility]

## 2017-09-17 NOTE — Procedures (Signed)
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  She also has lumbar scoliosis.  She reports to me today that she has been suffering for chronic obstipation and feels like her back pain will worsen when her constipation worsens.  Both sides radiating down in the legs are more of an L5 distribution.  She has been taking Advil at night.  She has had no new trauma.  Prior injection was a bilateral L4 transforaminal injection under June and she has been doing well up until just recently.  We will repeat the bilateral L4 transforaminal injection from a diagnostic and hopefully therapeutic standpoint.  Exam today shows good distal strength of the patient ambulates without aid.  Lumbosacral Transforaminal Epidural Steroid Injection - Infraneural Approach with Fluoroscopic Guidance  Patient: Caitlyn [name]      Date of Birth: Mar 22, 1930 MRN: [id number] PCP: Hulan Fess, MD      Visit Date: 09/16/2017   Universal Protocol:     Consent Given By: the patient  Position: PRONE   Additional Comments: Vital signs were monitored before and after the procedure. Patient was prepped and draped in the usual sterile fashion. The correct patient, procedure, and site was verified.   Injection Procedure Details:  Procedure Site One Meds Administered:  Meds ordered this encounter  Medications  . lidocaine (PF) (XYLOCAINE) 1 % injection 2 mL  . betamethasone acetate-betamethasone sodium phosphate (CELESTONE) injection 12 mg      Laterality: Bilateral  Location/Site:  L4-L5  Needle size: 22 G  Needle type: Spinal  Needle Placement: Transforaminal  Findings:  -Contrast Used: 1 mL iohexol 180 mg iodine/mL   -Comments: Excellent flow of contrast along the nerve and into the epidural space.  Initial placement of the needle at the L4 foramen on the right did not cause significant pain but the patient did get a small bit of paresthesia.  When we injected  contrast media we had good flow of contrast but the patient had significant nerve pain at that point.  We did reposition the right side of the patient did well after that.  It appears that the best approach on the right is an infra neural approach the best approach on the left is a sub-pedicle approach.  She does have significant rightward scoliosis.  Procedure Details: After squaring off the end-plates of the desired vertebral level to get a true AP view, the C-arm was obliqued to the painful side so that the superior articulating process is positioned about 1/3 the length of the inferior endplate.  The needle was aimed toward the junction of the superior articular process and the transverse process of the inferior vertebrae. The needle's initial entry is in the lower third of the foramen through Kambin's triangle. The soft tissues overlying this target were infiltrated with 2-3 ml. of 1% Lidocaine without Epinephrine.  The spinal needle was then inserted and advanced toward the target using a "trajectory" view along the fluoroscope beam.  Under AP and lateral visualization, the needle was advanced so it did not puncture dura and did not traverse medially beyond the 6 o'clock position of the pedicle. Bi-planar projections were used to confirm position. Aspiration was confirmed to be negative for CSF and/or blood. A 1-2 ml. volume of Isovue-250 was injected and flow of contrast was noted at each level. Radiographs were obtained for documentation purposes.   After attaining the desired flow of contrast documented above, a 0.5 to 1.0 ml test dose of 0.25%  Marcaine was injected into each respective transforaminal space.  The patient was observed for 90 seconds post injection.  After no sensory deficits were reported, and normal lower extremity motor function was noted,   the above injectate was administered so that equal amounts of the injectate were placed at each foramen (level) into the transforaminal  epidural space.   Additional Comments:  The patient tolerated the procedure well Dressing: Band-Aid    Post-procedure details: Patient was observed during the procedure. Post-procedure instructions were reviewed.  Patient left the clinic in stable condition.

## 2017-10-13 ENCOUNTER — Inpatient Hospital Stay: Admission: RE | Admit: 2017-10-13 | Payer: Medicare Other | Source: Ambulatory Visit

## 2017-10-27 ENCOUNTER — Ambulatory Visit (INDEPENDENT_AMBULATORY_CARE_PROVIDER_SITE_OTHER): Payer: Medicare Other | Admitting: Orthopaedic Surgery

## 2017-11-04 ENCOUNTER — Ambulatory Visit
Admission: RE | Admit: 2017-11-04 | Discharge: 2017-11-04 | Disposition: A | Discharge status: Home / Self Care | Payer: Medicare Other | Source: Ambulatory Visit | Attending: Orthopaedic Surgery | Admitting: Orthopaedic Surgery

## 2017-11-04 DIAGNOSIS — M810 Age-related osteoporosis without current pathological fracture: Secondary | ICD-10-CM

## 2017-11-17 ENCOUNTER — Encounter (INDEPENDENT_AMBULATORY_CARE_PROVIDER_SITE_OTHER): Payer: Self-pay | Admitting: Orthopaedic Surgery

## 2017-11-17 ENCOUNTER — Ambulatory Visit (INDEPENDENT_AMBULATORY_CARE_PROVIDER_SITE_OTHER): Payer: Medicare Other | Admitting: Orthopaedic Surgery

## 2017-11-17 VITALS — BP 133/79 | HR 94 | Ht 64.0 in | Wt 140.0 lb

## 2017-11-17 DIAGNOSIS — M810 Age-related osteoporosis without current pathological fracture: Secondary | ICD-10-CM | POA: Diagnosis not present

## 2017-11-17 DIAGNOSIS — M4126 Other idiopathic scoliosis, lumbar region: Secondary | ICD-10-CM | POA: Diagnosis not present

## 2017-11-17 MED ORDER — ALENDRONATE SODIUM 70 MG PO TABS: 70.0000 mg | ORAL_TABLET | ORAL | 6 refills | Status: DC

## 2017-11-17 NOTE — Progress Notes (Signed)
Office Visit Note   Patient: DEEYA RICHESON           Date of Birth: 1930-06-22           MRN: [id number] Visit Date: 11/17/2017              Requested by: Hulan Fess, MD [place], [place] [address] PCP: Hulan Fess, MD   Assessment & Plan: Visit Diagnoses:  1. Age-related osteoporosis without current pathological fracture   2. Other idiopathic scoliosis, lumbar region     Plan: With her history of epidural injections and potential for bone loss with associated corticosteroids will restart her on weekly Fosamax and see how she tolerates it.  70 mg tablets once a week we discussed how she has to take the medication and went over it carefully.  Recheck 1 year.  Follow-Up Instructions: No Follow-up on file.   Orders:  No orders of the defined types were placed in this encounter.  No orders of the defined types were placed in this encounter.     Procedures: No procedures performed   Clinical Data: No additional findings.   Subjective: Chief Complaint  Patient presents with  . Results    review bone density    HPI patient returns she is had a recent bone density test.  Patient has scoliosis with lumbar disc degeneration.  At times she has flares when she has to lay down and rest.  Increased pain with walking.  She also has increased pain if she sits in a hard chair for an extended period of time.  Years ago she tried Fosamax and had some problems tolerating it with GI disturbance.  She has had lumbar epidurals in the past with Dr. Ernestina Patches.  Patient had some swelling in her lower extremities.  In the past she has had some problems with wearing support stockings since she feels that they are tight.  No venous stasis ulcers in her legs in the past.  Review of Systems 14 point review of systems positive for hypertension, hypertensive retinopathy, hyperlipidemia, scoliosis, lumbar degenerative disc disease.  Otherwise negative as it pertains  HPI.   Objective: Vital Signs: BP 133/79   Pulse 94   Ht 5\' 4"  (1.626 m)   Wt 140 lb (63.5 kg)   BMI 24.03 kg/m   Physical Exam  Constitutional: She is oriented to person, place, and time. She appears well-developed.  HENT:  Head: Normocephalic.  Right Ear: External ear normal.  Left Ear: External ear normal.  Eyes: Pupils are equal, round, and reactive to light.  Neck: No tracheal deviation present. No thyromegaly present.  Cardiovascular: Normal rate.  Pulmonary/Chest: Effort normal.  Abdominal: Soft.  Neurological: She is alert and oriented to person, place, and time.  Skin: Skin is warm and dry.  Psychiatric: She has a normal mood and affect. Her behavior is normal.    Ortho Exam patient is ambulatory.  She has some mild edema both lower extremities without venous stasis changes.  Palpable pulses.  Negative logroll of the hips.  Negative Faber test.  She has some sciatic notch tenderness and lumbar scoliosis with last films showing 40 degree curve.  Specialty Comments:  No specialty comments available.  Imaging: EXAM: DUAL X-RAY ABSORPTIOMETRY (DXA) FOR BONE MINERAL DENSITY  IMPRESSION: Referring Physician:  Marybelle Killings  PATIENT: Name: Trisha Mangle S Patient ID: [id number] Birth Date: 07-31-1930 Height: 64.0 in. Sex: Female Measured: 11/04/2017 Weight: 141.0 lbs. Indications: Advanced Age, Caucasian, Estrogen  Deficient, Height Loss (781.91), History of Fracture (Adult) (V15.51), History of Osteoporosis, Hysterectomy, Postmenopausal, scoliosis, Secondary Osteoporosis, Tums Fractures: Knee Treatments: Calcium (E943.0), Multivitamin, Vitamin D (E933.5)  ASSESSMENT: The BMD measured at Forearm Radius 33% is 0.656 g/cm2 with a T-score of -2.6. This patient is considered osteoporotic according to [facility]) criteria. Lumbar spine was excluded on a previous exam. There has been no statistically significant change in BMD of the left hip  since prior exam dated 07/18/2013. Patient does not meet criteria for FRAX assessment.  Site Region Measured Date Measured Age YA BMD Significant CHANGE T-score DualFemur Neck Left 11/04/2017 84 -2.6 0.679 g/cm2  Left Forearm Radius 33% 11/04/2017 84 -2.6 0.656 g/cm2  World Health Organization [facility]) criteria for post-menopausal, Caucasian Women: Normal       T-score at or above -1 SD Osteopenia   T-score between -1 and -2.5 SD Osteoporosis T-score at or below -2.5 SD  RECOMMENDATION: [facility] recommends that FDA-approved medical therapies be considered in postmenopausal women and men age 26 or older with a:  1. Hip or vertebral (clinical or morphometric) fracture. 2. T-score of <-2.5 at the spine or hip. 3. Ten-year fracture probability by FRAX of 3% or greater for hip fracture or 20% or greater for major osteoporotic fracture.  All treatment decisions require clinical judgment and consideration of individual patient factors, including patient preferences, co-morbidities, previous drug use, risk factors not captured in the FRAX model (e.g. falls, vitamin D deficiency, increased bone turnover, interval significant decline in bone density) and possible under - or over-estimation of fracture risk by FRAX.  All patients should ensure an adequate intake of dietary calcium (1200 mg/d) and vitamin D (800 IU daily) unless contraindicated.  FOLLOW-UP: People with diagnosed cases of osteoporosis or at high risk for fracture should have regular bone mineral density tests. For patients eligible for Medicare, routine testing is allowed once every 2 years. The testing frequency can be increased to one year for patients who have rapidly progressing disease, those who are receiving or discontinuing medical therapy to restore bone mass, or have additional risk factors.  I have reviewed this report, and agree with the above findings.  [facility]  Radiology   Electronically Signed   By: Fidela Salisbury M.D.   On: 11/04/2017 14:54   PMFS History: Patient Active Problem List   Diagnosis Date Noted  . Abnormal CXR 05/25/2017  . Hypertensive retinopathy 12/04/2015  . Chest pain with moderate risk for cardiac etiology 10/15/2015  . Scoliosis deformity of spine 10/15/2015  . Hyperlipidemia 10/16/2013  . Essential hypertension 10/16/2013  . Mitral valve prolapse 10/16/2013   Past Medical History:  Diagnosis Date  . AC (acromioclavicular) joint bone spurs 2008   Back  . Anemia   . Arthritis   . Blood transfusion   . Bronchitis   . COPD (chronic obstructive pulmonary disease) ([place])   . GERD (gastroesophageal reflux disease)    uses tums  . Hepatitis    1970's  . Hyperlipidemia   . Hypertension   . Mitral valve prolapse 10/16/2013  . MVP (mitral valve prolapse)    with mild to mod MR and TR 04/2012; followed by Dr. Rollene Fare [facility])  . Osteoporosis   . Sleep apnea    no treatment at this time, had sleep study apprx 8 years ago    Family History  Problem Relation Age of Onset  . Alzheimer's disease Mother   . Alzheimer's disease Father     Past  Surgical History:  Procedure Laterality Date  . ABDOMINAL HYSTERECTOMY  1964  . ANTERIOR CERVICAL DECOMP/DISCECTOMY FUSION    . [place]  . HERNIA REPAIR    . INGUINAL HERNIA REPAIR  06/16/2012   Procedure: HERNIA REPAIR INGUINAL ADULT;  Surgeon: Madilyn Hook, DO;  Location: [facility];  Service: General;  Laterality: Right;  . KNEE ARTHROSCOPY  2007 & 2008  . NM MYOCAR PERF WALL MOTION  05/27/2011   normal  . SPINE SURGERY    . TONGUE BIOPSY  2000  . US ECHOCARDIOGRAPHY  06/07/2012   mild concentric LVH,LA mod. dilated,RA is mild to mod. dilated, mild MVP,prolapse of the posterior leaflet,mild mitral annular ca+,mild to mod MR & TR, AOV mildly sclerotic, aortic root sclerosis/ca+, insignificant pericardial effusion.  [facility] UVULECTOMY     Social History    Occupational History  . Not on file  Tobacco Use  . Smoking status: Never Smoker  . Smokeless tobacco: Never Used  Substance and Sexual Activity  . Alcohol use: Yes    Comment: occassional  . Drug use: No  . Sexual activity: Not on file

## 2017-12-09 ENCOUNTER — Telehealth: Payer: Self-pay | Admitting: Cardiovascular Disease

## 2017-12-09 DIAGNOSIS — E7800 Pure hypercholesterolemia, unspecified: Secondary | ICD-10-CM

## 2017-12-09 DIAGNOSIS — H35033 Hypertensive retinopathy, bilateral: Secondary | ICD-10-CM

## 2017-12-09 NOTE — Telephone Encounter (Signed)
Mrs. Caitlyn [name] would like to have labs done before her appt with Dr. Sallyanne Kuster on 01/05/18.

## 2017-12-09 NOTE — Telephone Encounter (Signed)
Left message to call back  

## 2017-12-10 NOTE — Telephone Encounter (Signed)
Left message for pt to call.

## 2017-12-28 ENCOUNTER — Telehealth (INDEPENDENT_AMBULATORY_CARE_PROVIDER_SITE_OTHER): Payer: Self-pay | Admitting: Orthopaedic Surgery

## 2017-12-28 NOTE — Telephone Encounter (Signed)
Patient called started the Alendronate Sodium Tablets that she took them for 5 weeks which the medication made her so sick. Patient asked if Dr. Lorin Mercy can prescribe something else for her. The number to contact patient is [phone]

## 2017-12-28 NOTE — Telephone Encounter (Signed)
Please advise 

## 2017-12-28 NOTE — Telephone Encounter (Signed)
New message     She will be stopping by tomorrow can you have orders in computer for any lab work needed

## 2017-12-28 NOTE — Telephone Encounter (Signed)
Spoke with pt, aware lab orders placed.

## 2017-12-29 ENCOUNTER — Telehealth (INDEPENDENT_AMBULATORY_CARE_PROVIDER_SITE_OTHER): Payer: Self-pay | Admitting: Orthopaedic Surgery

## 2017-12-29 DIAGNOSIS — M810 Age-related osteoporosis without current pathological fracture: Secondary | ICD-10-CM

## 2017-12-29 LAB — COMPREHENSIVE METABOLIC PANEL
ALK PHOS: 88 IU/L (ref 39–117)
ALT: 15 IU/L (ref 0–32)
AST: 23 IU/L (ref 0–40)
Albumin/Globulin Ratio: 2.1 (ref 1.2–2.2)
Albumin: 4.7 g/dL (ref 3.5–4.7)
BUN/Creatinine Ratio: 21 (ref 12–28)
BUN: 14 mg/dL (ref 8–27)
Bilirubin Total: 1.3 mg/dL — ABNORMAL HIGH (ref 0.0–1.2)
CO2: 29 mmol/L (ref 20–29)
CREATININE: 0.67 mg/dL (ref 0.57–1.00)
Calcium: 9.7 mg/dL (ref 8.7–10.3)
Chloride: 100 mmol/L (ref 96–106)
GFR calc Af Amer: 92 mL/min/{1.73_m2} (ref >59)
GFR calc non Af Amer: 80 mL/min/{1.73_m2} (ref >59)
GLUCOSE: 84 mg/dL (ref 65–99)
Globulin, Total: 2.2 g/dL (ref 1.5–4.5)
Potassium: 5.2 mmol/L (ref 3.5–5.2)
Sodium: 142 mmol/L (ref 134–144)
Total Protein: 6.9 g/dL (ref 6.0–8.5)

## 2017-12-29 LAB — LIPID PANEL
Chol/HDL Ratio: 1.7 ratio (ref 0.0–4.4)
Cholesterol, Total: 162 mg/dL (ref 100–199)
HDL: 95 mg/dL (ref >39)
LDL CALC: 55 mg/dL (ref 0–99)
Triglycerides: 60 mg/dL (ref 0–149)
VLDL CHOLESTEROL CAL: 12 mg/dL (ref 5–40)

## 2017-12-29 LAB — TSH: TSH: 3.14 u[IU]/mL (ref 0.450–4.500)

## 2017-12-29 NOTE — Telephone Encounter (Signed)
Patient called stating that she talked to you about having trouble with her medication.  Dr. Rex Kras suggested the injection Prolia and wanted to know what Dr. Lorin Mercy thought about that injection for her.  CB#[phone].  Thank you

## 2017-12-29 NOTE — Telephone Encounter (Signed)
Send her to Dr. Layne Benton osteoporosis clinic thanks

## 2017-12-29 NOTE — Telephone Encounter (Signed)
OK with me.

## 2017-12-29 NOTE — Telephone Encounter (Signed)
Please advise 

## 2017-12-30 NOTE — Telephone Encounter (Signed)
I left voicemail for patient advising. I did advise that prior to this message, Dr. Lorin Mercy was going to refer her to Dr. Layne Benton osteoporosis clinic. I asked patient to call back and leave me a message if this is something she would like for me to do.

## 2017-12-30 NOTE — Telephone Encounter (Signed)
I spoke with patient. She states that she does not understand why anyone is talking about osteoporosis because her problem is scoliosis. She is not enthused about starting over with another doctor (Dr. Layne Benton).  She also does not know what to think about the Prolia injection because she feels that it would have been recommended by Dr. Lorin Mercy if that was what was needed. She would like to know what Dr. Lorin Mercy recommends the most and states that is what she will do. She asked that I send message back to Dr. Lorin Mercy and find out his exact recommendation or if she truly needs anything.  Please advise.

## 2017-12-30 NOTE — Telephone Encounter (Signed)
I left voicemail for patient returning her call. Asked for return call if she would like to speak with me. I did advise I will be in [place] office tomorrow and back in [place] office Friday.

## 2017-12-30 NOTE — Telephone Encounter (Signed)
I left voicemail for patient advising that we could send her to Dr. Layne Benton. Asked for return call if this is something she wishes to do.

## 2017-12-30 NOTE — Telephone Encounter (Signed)
If you could give patient a call back to discuss. Thank you. CB # [phone]

## 2017-12-31 NOTE — Telephone Encounter (Signed)
I called, proceed

## 2018-01-01 ENCOUNTER — Ambulatory Visit: Payer: Medicare Other | Admitting: Cardiovascular Disease

## 2018-01-01 DIAGNOSIS — M810 Age-related osteoporosis without current pathological fracture: Secondary | ICD-10-CM | POA: Insufficient documentation

## 2018-01-01 NOTE — Telephone Encounter (Signed)
Referral entered for Dr. Hort[facility] at [facility].

## 2018-01-05 ENCOUNTER — Encounter: Payer: Self-pay | Admitting: Cardiovascular Disease

## 2018-01-05 ENCOUNTER — Ambulatory Visit (INDEPENDENT_AMBULATORY_CARE_PROVIDER_SITE_OTHER): Payer: Medicare Other | Admitting: Cardiovascular Disease

## 2018-01-05 VITALS — BP 136/76 | HR 85 | Ht 64.5 in | Wt 140.0 lb

## 2018-01-05 DIAGNOSIS — I10 Essential (primary) hypertension: Secondary | ICD-10-CM

## 2018-01-05 DIAGNOSIS — I341 Nonrheumatic mitral (valve) prolapse: Secondary | ICD-10-CM | POA: Diagnosis not present

## 2018-01-05 DIAGNOSIS — E7800 Pure hypercholesterolemia, unspecified: Secondary | ICD-10-CM

## 2018-01-05 NOTE — Telephone Encounter (Signed)
Please call pt to discuss pt care. Pt called I think she is confused about what is the next step. I did discuss with her the call that was entered on 01/01/18 about Dr.Bassett.

## 2018-01-05 NOTE — Progress Notes (Signed)
Patient ID: Caitlyn [name], female   DOB: January 22, 1930, 84 y.o.   MRN: [id number]    Cardiology Office Note    Date:  01/05/2018   ID:  AYMAR WHITFILL, DOB 12-02-1929, MRN [id number]  PCP:  Hulan Fess, MD  Cardiologist:   Sanda Klein, MD   Chief Complaint  Patient presents with  . Follow-up    has a list of concerns shw would like to discuss, no chest pain, has some shortness of breath due to scoloisis      History of Present Illness:  Caitlyn [name] is a 84 y.o. female with long-standing systemic hypertension and hyperlipidemia and mild mitral valve prolapse returning for follow-up.   She has really not had any cardiovascular problems since her last appointment.  Her complaints are all related to her scoliosis.  She has chronic back pain and limited mobility.  She becomes very easily tired.  She reports having had 6 spinal injections this year.  She also has osteoporosis, but has had to stop Fosamax due to GI side effects.   Her lipid profile is excellent and she does not appear to have any side effects with atorvastatin in the current dose.  She has no known history of coronary disease with a normal nuclear stress test in 2012 and no history of previous cardiac catheterization. Beta blockers have generally worked well to control her palpitations. In the past she has had side effects with certain statins (simvastatin, Crestor) but she seems to tolerate atorvastatin reasonably well.   Past Medical History:  Diagnosis Date  . AC (acromioclavicular) joint bone spurs 2008   Back  . Anemia   . Arthritis   . Blood transfusion   . Bronchitis   . COPD (chronic obstructive pulmonary disease) ([place])   . GERD (gastroesophageal reflux disease)    uses tums  . Hepatitis    1970's  . Hyperlipidemia   . Hypertension   . Mitral valve prolapse 10/16/2013  . MVP (mitral valve prolapse)    with mild to mod MR and TR 04/2012; followed by Dr. Rollene Fare [facility])  . Osteoporosis   . Sleep apnea      no treatment at this time, had sleep study apprx 8 years ago    Past Surgical History:  Procedure Laterality Date  . ABDOMINAL HYSTERECTOMY  1964  . ANTERIOR CERVICAL DECOMP/DISCECTOMY FUSION    . [place]  . HERNIA REPAIR    . INGUINAL HERNIA REPAIR  06/16/2012   Procedure: HERNIA REPAIR INGUINAL ADULT;  Surgeon: Madilyn Hook, DO;  Location: [facility];  Service: General;  Laterality: Right;  . KNEE ARTHROSCOPY  2007 & 2008  . NM MYOCAR PERF WALL MOTION  05/27/2011   normal  . SPINE SURGERY    . TONGUE BIOPSY  2000  . US ECHOCARDIOGRAPHY  06/07/2012   mild concentric LVH,LA mod. dilated,RA is mild to mod. dilated, mild MVP,prolapse of the posterior leaflet,mild mitral annular ca+,mild to mod MR & TR, AOV mildly sclerotic, aortic root sclerosis/ca+, insignificant pericardial effusion.  Marland Kitchen UVULECTOMY      Outpatient Medications Prior to Visit  Medication Sig Dispense Refill  . acetaminophen (TYLENOL) 325 MG tablet Take 650 mg by mouth every 6 (six) hours as needed.    Marland Kitchen antiseptic oral rinse (BIOTENE) LIQD 15 mLs by Mouth Rinse route as needed for dry mouth.    Marland Kitchen aspirin 81 MG tablet Take 81 mg by mouth daily.    Marland Kitchen atorvastatin (LIPITOR)  20 MG tablet TAKE ONE TABLET BY MOUTH DAILY 90 tablet 3  . brimonidine-timolol (COMBIGAN) 0.2-0.5 % ophthalmic solution Place 1 drop into the right eye every 12 (twelve) hours.    . Calcium Carbonate Antacid (TUMS PO) Take 1 tablet by mouth daily.    . Cholecalciferol (VITAMIN D3) 2000 UNITS TABS Take 1 capsule by mouth daily.     . Coenzyme Q10 (CO Q 10) 100 MG CAPS Take 100 mg by mouth daily.    . cycloSPORINE (RESTASIS) 0.05 % ophthalmic emulsion Place 1 drop into both eyes 2 (two) times daily.     . hydrochlorothiazide (MICROZIDE) 12.5 MG capsule Take 1 capsule (12.5 mg total) by mouth daily. 30 capsule 8  . Iron-Vitamins (GERITOL COMPLETE) TABS Take 1 tablet by mouth daily.    Marland Kitchen latanoprost (XALATAN) 0.005 % ophthalmic solution  Place 1 drop into the right eye daily.    Marland Kitchen NASAL SALINE NA Place into the nose as needed.    Marland Kitchen alendronate (FOSAMAX) 70 MG tablet Take 1 tablet (70 mg total) by mouth once a week. Take with a full glass of water on an empty stomach. (Patient not taking: Reported on 01/05/2018) 12 tablet 6   No facility-administered medications prior to visit.      Allergies:   Fosamax [alendronate sodium]; Latex; and Statins   Social History   Socioeconomic History  . Marital status: Married    Spouse name: None  . Number of children: None  . Years of education: None  . Highest education level: None  Social Needs  . Financial resource strain: None  . Food insecurity - worry: None  . Food insecurity - inability: None  . Transportation needs - medical: None  . Transportation needs - non-medical: None  Occupational History  . None  Tobacco Use  . Smoking status: Never Smoker  . Smokeless tobacco: Never Used  Substance and Sexual Activity  . Alcohol use: Yes    Comment: occassional  . Drug use: No  . Sexual activity: None  Other Topics Concern  . None  Social History Narrative  . None     Family History:  The patient's   family history includes Alzheimer's disease in her father and mother.   ROS:   Please see the history of present illness.    ROS All other systems reviewed and are negative.   PHYSICAL EXAM:   VS:  BP 136/76   Pulse 85   Ht 5' 4.5" (1.638 m)   Wt 140 lb (63.5 kg)   BMI 23.66 kg/m    Scoliosis General: Alert, oriented x3, no distress Head: no evidence of trauma, PERRL, EOMI, no exophtalmos or lid lag, no myxedema, no xanthelasma; normal ears, nose and oropharynx Neck: normal jugular venous pulsations and no hepatojugular reflux; brisk carotid pulses without delay and no carotid bruits Chest: clear to auscultation, no signs of consolidation by percussion or palpation, normal fremitus, symmetrical and full respiratory excursions Cardiovascular: normal position and  quality of the apical impulse, regular rhythm, normal first and second heart sounds, no murmurs, rubs or gallops Abdomen: no tenderness or distention, no masses by palpation, no abnormal pulsatility or arterial bruits, normal bowel sounds, no hepatosplenomegaly Extremities: no clubbing, cyanosis or edema; 2+ radial, ulnar and brachial pulses bilaterally; 2+ right femoral, posterior tibial and dorsalis pedis pulses; 2+ left femoral, posterior tibial and dorsalis pedis pulses; no subclavian or femoral bruits Neurological: grossly nonfocal Psych: Normal mood and affect  Wt Readings from Last  3 Encounters:  01/05/18 140 lb (63.5 kg)  11/17/17 140 lb (63.5 kg)  05/25/17 139 lb (63 kg)      Studies/Labs Reviewed:   EKG:  EKG is ordered today.  The ekg ordered today demonstrates borderline sinus tachycardia, otherwise normal tracing With QTC 427 ms  Recent Labs: 12/29/2017: ALT 15; BUN 14; Creatinine, Ser 0.67; Potassium 5.2; Sodium 142; TSH 3.140   Lipid Panel    Component Value Date/Time   CHOL 162 12/29/2017 1100   TRIG 60 12/29/2017 1100   HDL 95 12/29/2017 1100   CHOLHDL 1.7 12/29/2017 1100   CHOLHDL 1.8 11/27/2015 1125   VLDL 13 11/27/2015 1125   LDLCALC 55 12/29/2017 1100     ASSESSMENT:    No diagnosis found.   PLAN:  In order of problems listed above:  1. HTN: Good control 2. HLP: Excellent lipid profile, exceptional HDL cholesterol level 3. MVP: Asymptomatic.  Does not appear to be hemodynamically important.  Had mild posterior leaflet prolapse with mild-moderate insufficiency. Dilated left atrium, normal left ventricular size and function by echo.  We discussed the fact that the current guidelines no longer recommend antibiotic prophylaxis, but she is very habituated with the use of amoxicillin before dental procedures and does not want to change. 4. Tachycardia: No longer an issue, suspect it was related to diuretic use and pain.    Medication Adjustments/Labs and  Tests Ordered: Current medicines are reviewed at length with the patient today.  Concerns regarding medicines are outlined above.  Medication changes, Labs and Tests ordered today are listed in the Patient Instructions below. There are no Patient Instructions on file for this visit.     Signed, Sanda Klein, MD  01/05/2018 10:57 AM    [facility] [place], [place], [place]  [address] Phone: [phone]; Fax: [phone]

## 2018-01-05 NOTE — Patient Instructions (Signed)
Dr Croitoru recommends that you schedule a follow-up appointment in 12 months. You will receive a reminder letter in the mail two months in advance. If you don't receive a letter, please call our office to schedule the follow-up appointment.  If you need a refill on your cardiac medications before your next appointment, please call your pharmacy. 

## 2018-01-06 NOTE — Telephone Encounter (Signed)
I called patient and explained referral has been sent to Dr. Layne Benton and they may have to review her records prior to called her.  Their office should contact her to schedule an appointment. I did ask her to return my call next week if she has not heard from their office and I would check referral status.

## 2018-01-13 ENCOUNTER — Other Ambulatory Visit: Payer: Self-pay | Admitting: Cardiovascular Disease

## 2018-01-14 NOTE — Telephone Encounter (Signed)
Rx(s) sent to pharmacy electronically.  

## 2018-02-08 ENCOUNTER — Telehealth (INDEPENDENT_AMBULATORY_CARE_PROVIDER_SITE_OTHER): Payer: Self-pay | Admitting: Orthopaedic Surgery

## 2018-02-08 NOTE — Telephone Encounter (Signed)
Patient left a vm message wanting to talk to Dr. Lorin Mercy or see Dr. Lorin Mercy about her neck.  She states that she in severe pain and has been for the last 3 days.  CB#[phone].  Thank you.

## 2018-02-08 NOTE — Telephone Encounter (Signed)
I called and spoke with patient. Advised Dr. Lorin Mercy is out of the office this week. She states her husband has Methocarbamol and she wondered if she could take that. I explained that I could not recommend her taking medication and I would not know if it would have reaction with anything else that she takes. She has been to see Dr. Layne Benton for osteoporosis and is set up to have her first injection on Thursday. She is going to call her and see what she advises. I did ask her to call back if she recommends a visit and we would have to work her in with someone else.

## 2018-03-02 ENCOUNTER — Ambulatory Visit (INDEPENDENT_AMBULATORY_CARE_PROVIDER_SITE_OTHER): Payer: Self-pay

## 2018-03-02 ENCOUNTER — Encounter (INDEPENDENT_AMBULATORY_CARE_PROVIDER_SITE_OTHER): Payer: Self-pay | Admitting: Physician Assistant

## 2018-03-02 ENCOUNTER — Ambulatory Visit (INDEPENDENT_AMBULATORY_CARE_PROVIDER_SITE_OTHER): Payer: Medicare Other | Admitting: Physician Assistant

## 2018-03-02 DIAGNOSIS — M542 Cervicalgia: Secondary | ICD-10-CM

## 2018-03-02 MED ORDER — METHYLPREDNISOLONE 4 MG PO TBPK: ORAL_TABLET | ORAL | 21 refills | Status: DC

## 2018-03-02 NOTE — Progress Notes (Signed)
Office Visit Note   Patient: Caitlyn [name]           Date of Birth: September 30, 1930           MRN: [id number] Visit Date: 03/02/2018              Requested by: Hulan Fess, MD [place], [place] [id number] PCP: Hulan Fess, MD   Assessment & Plan: Visit Diagnoses:  1. Neck pain   2. Cervicalgia     Plan: At this point, I will go ahead and call in a steroid taper to help with her acute pain.  We will also obtain an MRI of her cervical spine.  She will follow-up with Dr. Lorin Mercy once this is completed.  Call if concerns or questions in the meantime.  Follow-Up Instructions: Return in about 2 weeks (around 03/16/2018) for with Dr. Lorin Mercy in two weeks after mri.   Orders:  Orders Placed This Encounter  Procedures  . XR Cervical Spine 2 or 3 views  . MR Cervical Spine w/o contrast   Meds ordered this encounter  Medications  . methylPREDNISolone (MEDROL DOSEPAK) 4 MG TBPK tablet    Sig: Take as directed    Dispense:  21 tablet    Refill:  21      Procedures: No procedures performed   Clinical Data: No additional findings.   Subjective: Chief Complaint  Patient presents with  . Neck - Pain    HPI patient is a pleasant 84 year old female who presents to our clinic today with neck pain.  This began approximately 1-1/2 weeks ago without any known injury or change in activity.  She does have a history of a previous cervical spine fusion at C4 and 5 and C6-7 by Dr. Lorin Mercy.  The pain she has is described as a constant ache worse with extension of the neck.  Better with flexion.  [facility] the [facility] where she was given a muscle relaxer as well as Norco.  These been of minimal relief.  She denies any numbness tingling or burning to the upper extremities but does note marked weakness to the left arm.  Review of Systems as detailed in HPI.  All are reviewed and are negative.   Objective: Vital Signs: There were no vitals taken for this  visit.  Physical Exam well-developed well-nourished female no acute distress.  Alert and oriented x3.  Ortho Exam examination of the cervical spine reveals limited extension secondary to pain.  No pain with flexion or rotation.  Specialty Comments:  No specialty comments available.  Imaging: Xr Cervical Spine 2 Or 3 Views  Result Date: 03/02/2018 X-ray showed diffuse degenerative disc disease    PMFS History: Patient Active Problem List   Diagnosis Date Noted  . Neck pain 03/02/2018  . Age-related osteoporosis without current pathological fracture 01/01/2018  . Abnormal CXR 05/25/2017  . Hypertensive retinopathy 12/04/2015  . Chest pain with moderate risk for cardiac etiology 10/15/2015  . Scoliosis deformity of spine 10/15/2015  . Hyperlipidemia 10/16/2013  . Essential hypertension 10/16/2013  . Mitral valve prolapse 10/16/2013   Past Medical History:  Diagnosis Date  . AC (acromioclavicular) joint bone spurs 2008   Back  . Anemia   . Arthritis   . Blood transfusion   . Bronchitis   . COPD (chronic obstructive pulmonary disease) ([place])   . GERD (gastroesophageal reflux disease)    uses tums  . Hepatitis    1970's  .  Hyperlipidemia   . Hypertension   . Mitral valve prolapse 10/16/2013  . MVP (mitral valve prolapse)    with mild to mod MR and TR 04/2012; followed by Dr. Rollene Fare [facility])  . Osteoporosis   . Sleep apnea    no treatment at this time, had sleep study apprx 8 years ago    Family History  Problem Relation Age of Onset  . Alzheimer's disease Mother   . Alzheimer's disease Father     Past Surgical History:  Procedure Laterality Date  . ABDOMINAL HYSTERECTOMY  1964  . ANTERIOR CERVICAL DECOMP/DISCECTOMY FUSION    . [place]  . HERNIA REPAIR    . INGUINAL HERNIA REPAIR  06/16/2012   Procedure: HERNIA REPAIR INGUINAL ADULT;  Surgeon: Madilyn Hook, DO;  Location: [facility];  Service: General;  Laterality: Right;  . KNEE ARTHROSCOPY   2007 & 2008  . NM MYOCAR PERF WALL MOTION  05/27/2011   normal  . SPINE SURGERY    . TONGUE BIOPSY  2000  . US ECHOCARDIOGRAPHY  06/07/2012   mild concentric LVH,LA mod. dilated,RA is mild to mod. dilated, mild MVP,prolapse of the posterior leaflet,mild mitral annular ca+,mild to mod MR & TR, AOV mildly sclerotic, aortic root sclerosis/ca+, insignificant pericardial effusion.  Marland Kitchen UVULECTOMY     Social History   Occupational History  . Not on file  Tobacco Use  . Smoking status: Never Smoker  . Smokeless tobacco: Never Used  Substance and Sexual Activity  . Alcohol use: Yes    Comment: occassional  . Drug use: No  . Sexual activity: Not on file

## 2018-03-03 ENCOUNTER — Telehealth (INDEPENDENT_AMBULATORY_CARE_PROVIDER_SITE_OTHER): Payer: Self-pay | Admitting: Orthopaedic Surgery

## 2018-03-03 NOTE — Telephone Encounter (Signed)
Patient called advised the pain in her neck is pretty bad and she almost passed out yesterday because of the pain. Patient said she is having the MRI on the 30th at 12:35pm. Patient asked if she can be moved up to see Dr Lorin Mercy on 03/10/18. The number to contact patient is [phone]

## 2018-03-04 NOTE — Telephone Encounter (Signed)
I called and spoke with patient. Dr. Lorin Mercy will call her after he reviews MRI report. Her MRI has been moved to tomorrow morning at 0800.  I advised that he was in surgery in the morning but would be back in the office in the afternoon. She also wanted Dr. Lorin Mercy to be aware she would finish the prednisone dosepak on Saturday and may need a refill. She was also prescribed Robaxin and Hydrocodone on 4/20 but only took a few days because they make her nauseated.

## 2018-03-04 NOTE — Telephone Encounter (Signed)
Do you want to work her in somewhere on 5/1?  I was watching to see if there was a cancellation.

## 2018-03-05 ENCOUNTER — Ambulatory Visit
Admission: RE | Admit: 2018-03-05 | Discharge: 2018-03-05 | Disposition: A | Discharge status: Home / Self Care | Payer: Medicare Other | Source: Ambulatory Visit | Attending: Physician Assistant | Admitting: Physician Assistant

## 2018-03-05 DIAGNOSIS — M542 Cervicalgia: Secondary | ICD-10-CM

## 2018-03-05 NOTE — Telephone Encounter (Signed)
I called discussed. She will call for appt for Monday.

## 2018-03-05 NOTE — Telephone Encounter (Signed)
noted 

## 2018-03-05 NOTE — Telephone Encounter (Signed)
Please see MRI Cspine report in patient's chart from this morning and call her to advise.

## 2018-03-05 NOTE — Progress Notes (Signed)
Fu with yates to discuss mri

## 2018-03-08 ENCOUNTER — Telehealth (INDEPENDENT_AMBULATORY_CARE_PROVIDER_SITE_OTHER): Payer: Self-pay | Admitting: Orthopaedic Surgery

## 2018-03-08 ENCOUNTER — Ambulatory Visit (INDEPENDENT_AMBULATORY_CARE_PROVIDER_SITE_OTHER): Payer: Medicare Other | Admitting: Orthopaedic Surgery

## 2018-03-08 VITALS — BP 142/81 | HR 93 | Ht 64.5 in | Wt 140.0 lb

## 2018-03-08 DIAGNOSIS — M47812 Spondylosis without myelopathy or radiculopathy, cervical region: Secondary | ICD-10-CM | POA: Diagnosis not present

## 2018-03-08 DIAGNOSIS — Z981 Arthrodesis status: Secondary | ICD-10-CM

## 2018-03-08 DIAGNOSIS — M542 Cervicalgia: Secondary | ICD-10-CM

## 2018-03-08 NOTE — Progress Notes (Signed)
Office Visit Note   Patient: Caitlyn [name]           Date of Birth: 1930/07/31           MRN: [id number] Visit Date: 03/08/2018              Requested by: Hulan Fess, MD [place], [place] [id number] PCP: Hulan Fess, MD   Assessment & Plan: Visit Diagnoses:  1. Neck pain   2. History of fusion of cervical spine     Plan: Patient's left-sided neck pain is likely related to her C3-4 facet possibly C2-3 facet.  Labs see Dr. Ernestina Patches for consideration of an injection.  Follow-up if she has persistent symptoms.  Follow-Up Instructions: No follow-ups on file.   Orders:  Orders Placed This Encounter  Procedures  . Ambulatory referral to Physical Medicine Rehab   No orders of the defined types were placed in this encounter.     Procedures: No procedures performed   Clinical Data: No additional findings.   Subjective: No chief complaint on file.   HPI 84 year old female returns with continued problems with neck pain on the left side of her neck that radiates sometimes to her ear and sometimes in her shoulder.  She has had previous cervical fusion at C4-5 and C6 - 7 which is solidly healed.  MRI scan 03/05/2018 showed predominant left side facet arthropathy at C2-3 C3-4 with some foraminal narrowing at those levels which could potentially affect the exiting C3 and C4 nerve root.  She states she has been doing well for the last 5 days but 1 to 2 weeks ago she states her pain was severe.  She occasionally has had sharp pain left upper part of her neck when she turns and rotates her neck to the left sometimes to the right.  No numbness or tingling in her hands..  No bowel or bladder symptoms no fever or chills.  No gait disturbance.  She does have the significant lumbar scoliosis with disc degeneration and facet arthropathy which is been an ongoing problem for her.  Review of Systems patient brought a computer print out problems she said recently with some stomach  aching problems with hemorrhoids then some discomfort in the right hip and then ongoing recurrent problems with her sharp neck pain.  She is on twice a year injections for osteoporosis.  She gets infusion treatments.  She had prednisone pack in April which helped her neck to some degree but raised her blood pressure for short period of time.  Hemorrhoid problem treatment is better.  MRI scan cervical spine is available for review today.  Surgical medical history is updated unchanged from last office visit.  14 point review of systems updated.   Objective: Vital Signs: BP (!) 142/81 (BP Location: Left Arm)   Pulse 93   Ht 5' 4.5" (1.638 m)   Wt 140 lb (63.5 kg)   BMI 23.66 kg/m   Physical Exam  Constitutional: She is oriented to person, place, and time. She appears well-developed.  HENT:  Head: Normocephalic.  Right Ear: External ear normal.  Left Ear: External ear normal.  Eyes: Pupils are equal, round, and reactive to light.  Neck: No tracheal deviation present. No thyromegaly present.  Cardiovascular: Normal rate.  Pulmonary/Chest: Effort normal.  Abdominal: Soft.  Neurological: She is alert and oriented to person, place, and time.  Skin: Skin is warm and dry.  Psychiatric: She has a normal mood and affect. Her behavior  is normal.    Ortho Exam patient has mild brachial plexus tenderness on the left none on the right no supraclavicular lymphadenopathy.  Discomfort with extension no change with distraction.  Upper extremity reflexes are 2+ and symmetrical.  Well-healed anterior cervical incision.  She has scoliosis pelvis is level.  Normal heel toe gait.  Specialty Comments:  No specialty comments available.  Imaging: Study Result   CLINICAL DATA:  Extreme left-sided neck and shoulder pain, 2 weeks duration. Previous ACDF.  EXAM: MRI CERVICAL SPINE WITHOUT CONTRAST  TECHNIQUE: Multiplanar, multisequence MR imaging of the cervical spine was performed. No intravenous  contrast was administered.  COMPARISON:  Radiography 03/02/2018.  FINDINGS: Alignment: 2 mm anterolisthesis C2-3 and C7-T1.  Vertebrae: Previous ACDF C4-5 and C6-7.  Cord: No primary cord lesion.  No cord compression or edema.  Posterior Fossa, vertebral arteries, paraspinal tissues: Negative  Disc levels:  Foramen magnum is widely patent. There is osteoarthritis at the C1-2 articulation which could contribute to craniocervical pain. No encroachment upon the neural spaces.  C2-3: Facet degeneration on the left with 2 mm of anterolisthesis. No compressive central canal stenosis. Foraminal narrowing on the left that could affect the C3 nerve.  C3-4: Facet degeneration on the left. Endplate osteophytes and bulging of the disc. Narrowing of the subarachnoid space but no compression of the cord. AP diameter of the canal 8 mm. Foraminal narrowing on the left could affect the C4 nerve.  C4-5: Previous ACDF is solid. Wide patency of the canal and foramina.  C5-6: Small endplate osteophytes and mild bulging of the disc. No canal stenosis. Mild foraminal narrowing on the left because of osteophytic encroachment.  C6-7: Previous ACDF is solid. Wide patency of the canal and foramina.  C7-T1: Facet arthritis with 2 mm of anterolisthesis. No canal or foraminal stenosis.  Upper thoracic region shows ordinary facet arthritis and minor disc bulges but no compressive pathology.  IMPRESSION: Previous ACDF at C4-5 and at C6-7 with solid union and wide patency at those levels.  Osteoarthritis at the C1-2 articulation which could contribute to craniocervical pain.  Left-sided predominant facet arthropathy at C2-3 and C3-4. Left foraminal narrowing at those levels that would have potential to affect the exiting left C3 and C4 nerves.  Spondylosis and facet degeneration at C5-6 with mild left foraminal narrowing.  C7-T1 facet degeneration with 2 mm of  anterolisthesis but no apparent compressive stenosis.   Electronically Signed   By: Nelson Chimes M.D.   On: 03/05/2018 09:35      PMFS History: Patient Active Problem List   Diagnosis Date Noted  . Neck pain 03/02/2018  . Age-related osteoporosis without current pathological fracture 01/01/2018  . Abnormal CXR 05/25/2017  . Hypertensive retinopathy 12/04/2015  . Chest pain with moderate risk for cardiac etiology 10/15/2015  . Scoliosis deformity of spine 10/15/2015  . Hyperlipidemia 10/16/2013  . Essential hypertension 10/16/2013  . Mitral valve prolapse 10/16/2013   Past Medical History:  Diagnosis Date  . AC (acromioclavicular) joint bone spurs 2008   Back  . Anemia   . Arthritis   . Blood transfusion   . Bronchitis   . COPD (chronic obstructive pulmonary disease) ([place])   . GERD (gastroesophageal reflux disease)    uses tums  . Hepatitis    1970's  . Hyperlipidemia   . Hypertension   . Mitral valve prolapse 10/16/2013  . MVP (mitral valve prolapse)    with mild to mod MR and TR 04/2012; followed by Dr. Rollene Fare (  [facility])  . Osteoporosis   . Sleep apnea    no treatment at this time, had sleep study apprx 8 years ago    Family History  Problem Relation Age of Onset  . Alzheimer's disease Mother   . Alzheimer's disease Father     Past Surgical History:  Procedure Laterality Date  . ABDOMINAL HYSTERECTOMY  1964  . ANTERIOR CERVICAL DECOMP/DISCECTOMY FUSION    . [place]  . HERNIA REPAIR    . INGUINAL HERNIA REPAIR  06/16/2012   Procedure: HERNIA REPAIR INGUINAL ADULT;  Surgeon: Madilyn Hook, DO;  Location: [facility];  Service: General;  Laterality: Right;  . KNEE ARTHROSCOPY  2007 & 2008  . NM MYOCAR PERF WALL MOTION  05/27/2011   normal  . SPINE SURGERY    . TONGUE BIOPSY  2000  . US ECHOCARDIOGRAPHY  06/07/2012   mild concentric LVH,LA mod. dilated,RA is mild to mod. dilated, mild MVP,prolapse of the posterior leaflet,mild mitral annular  ca+,mild to mod MR & TR, AOV mildly sclerotic, aortic root sclerosis/ca+, insignificant pericardial effusion.  Marland Kitchen UVULECTOMY     Social History   Occupational History  . Not on file  Tobacco Use  . Smoking status: Never Smoker  . Smokeless tobacco: Never Used  Substance and Sexual Activity  . Alcohol use: Yes    Comment: occassional  . Drug use: No  . Sexual activity: Not on file

## 2018-03-08 NOTE — Telephone Encounter (Signed)
Patient requested that a copy of her MRI results be sent to Dr. Hulan Fess.  Thank you.

## 2018-03-09 ENCOUNTER — Other Ambulatory Visit: Payer: Medicare Other

## 2018-03-10 ENCOUNTER — Encounter (INDEPENDENT_AMBULATORY_CARE_PROVIDER_SITE_OTHER): Payer: Self-pay | Admitting: Orthopaedic Surgery

## 2018-03-10 DIAGNOSIS — Z981 Arthrodesis status: Secondary | ICD-10-CM | POA: Insufficient documentation

## 2018-03-10 NOTE — Telephone Encounter (Signed)
Faxed to pts PCP, Dr. Rex Kras

## 2018-03-10 NOTE — Telephone Encounter (Signed)
Please see below. Thanks!

## 2018-03-11 ENCOUNTER — Telehealth (INDEPENDENT_AMBULATORY_CARE_PROVIDER_SITE_OTHER): Payer: Self-pay | Admitting: Orthopaedic Surgery

## 2018-03-11 NOTE — Telephone Encounter (Signed)
Patient called to let Dr Lorin Mercy know that her neck is doing ok right now. Patient said she had a bowel impaction and do to straining so hard yesterday she is not feeling to good and want to try to see Dr Ernestina Patches sooner than 03/30/18. Patient said she is worried that her neck might get worse from all the straining. Patient asked if Dr Lorin Mercy had any pull to get her in sooner with Dr Ernestina Patches. The number to contact [phone].

## 2018-03-11 NOTE — Telephone Encounter (Signed)
Please see below. Patient is requesting earlier appt if available.  Could you put her on a cancellation list?

## 2018-03-12 NOTE — Telephone Encounter (Signed)
Appointment moved to 03/18/18.

## 2018-03-16 ENCOUNTER — Ambulatory Visit (INDEPENDENT_AMBULATORY_CARE_PROVIDER_SITE_OTHER): Payer: Medicare Other | Admitting: Orthopaedic Surgery

## 2018-03-18 ENCOUNTER — Ambulatory Visit (INDEPENDENT_AMBULATORY_CARE_PROVIDER_SITE_OTHER): Payer: Self-pay

## 2018-03-18 ENCOUNTER — Ambulatory Visit (INDEPENDENT_AMBULATORY_CARE_PROVIDER_SITE_OTHER): Payer: Medicare Other | Admitting: Physical Medicine and Rehabilitation

## 2018-03-18 ENCOUNTER — Encounter (INDEPENDENT_AMBULATORY_CARE_PROVIDER_SITE_OTHER): Payer: Self-pay | Admitting: Physical Medicine and Rehabilitation

## 2018-03-18 ENCOUNTER — Encounter

## 2018-03-18 VITALS — BP 138/79 | HR 79

## 2018-03-18 DIAGNOSIS — M47812 Spondylosis without myelopathy or radiculopathy, cervical region: Secondary | ICD-10-CM

## 2018-03-18 DIAGNOSIS — M419 Scoliosis, unspecified: Secondary | ICD-10-CM

## 2018-03-18 DIAGNOSIS — M542 Cervicalgia: Secondary | ICD-10-CM | POA: Diagnosis not present

## 2018-03-18 DIAGNOSIS — G894 Chronic pain syndrome: Secondary | ICD-10-CM

## 2018-03-18 DIAGNOSIS — M7918 Myalgia, other site: Secondary | ICD-10-CM

## 2018-03-18 DIAGNOSIS — M961 Postlaminectomy syndrome, not elsewhere classified: Secondary | ICD-10-CM

## 2018-03-18 MED ORDER — METHYLPREDNISOLONE ACETATE 80 MG/ML IJ SUSP: 80.0000 mg | Freq: Once | INTRAMUSCULAR | Status: DC

## 2018-03-18 NOTE — Progress Notes (Signed)
Increased left side neck pain for around 1 to month. Radiates into her shoulder at times and something up the neck to her ear. Worse with looking down at computer or phone. Denies any pain, tingling, or numbness in arm.  Numeric Pain Rating Scale and Functional Assessment Average Pain 7   In the last MONTH (on 0-10 scale) has pain interfered with the following?  1. General activity like being  able to carry out your everyday physical activities such as walking, climbing stairs, carrying groceries, or moving a chair?  Rating(4)   +Driver, -BT, -Dye Allergies.

## 2018-03-18 NOTE — Patient Instructions (Signed)

## 2018-03-30 ENCOUNTER — Encounter (INDEPENDENT_AMBULATORY_CARE_PROVIDER_SITE_OTHER): Payer: Self-pay | Admitting: Physical Medicine and Rehabilitation

## 2018-04-02 DIAGNOSIS — M7918 Myalgia, other site: Secondary | ICD-10-CM

## 2018-04-02 DIAGNOSIS — M542 Cervicalgia: Secondary | ICD-10-CM | POA: Diagnosis not present

## 2018-04-02 MED ORDER — TRIAMCINOLONE ACETONIDE 40 MG/ML IJ SUSP
20.0000 mg | INTRAMUSCULAR | Status: AC | PRN
  Administered 2018-04-02: 20 mg via INTRAMUSCULAR

## 2018-04-02 MED ORDER — LIDOCAINE HCL 1 % IJ SOLN
3.0000 mL | INTRAMUSCULAR | Status: AC | PRN
  Administered 2018-04-02: 3 mL

## 2018-04-02 NOTE — Progress Notes (Signed)
Caitlyn [name] - 84 y.o. female MRN [id number]  Date of birth: Oct 27, 1930  Office Visit Note: Visit Date: 03/18/2018 PCP: Hulan Fess, MD Referred by: Hulan Fess, MD  Subjective: Chief Complaint  Patient presents with  . Neck - Pain  . Left Shoulder - Pain  . Spine - Pain   HPI: Caitlyn [name] is an 84 year old right-hand-dominant female who comes in today at the request of Dr. Rodell Perna for left see 2-3 and C3-4 facet joint block.  She has been having 6 or more weeks of worsening severe neck pain that radiates into the shoulder.  Occasionally she will get some pain referred upwards into the ear.  She gets worsening with looking down at her phone and extension and rotation.  She has not had any symptoms down the arms.  MRI was obtained and this is reviewed again today and does show arthritic changes with edema on the left at C2-3 and C3-4.  She likely does have a component of arthritic pain.  She does have some myofascial pain syndrome which is probably the myotomal referral pattern to the ear.  On exam today I feel like more of her pain is myofascial pain she does have pretty active trigger point noted on exam but does reproduce a lot of her pain.  I discussed this at length with her and we are going to complete trigger point injection today.  If she does not get much relief then obviously we could look at left-sided facet joint blocks.   Review of Systems  Eyes: Negative for blurred vision.  Musculoskeletal: Positive for neck pain.  Neurological: Negative for dizziness, focal weakness and headaches.  All other systems reviewed and are negative.  Otherwise per HPI.  Assessment & Plan: Visit Diagnoses:  1. Cervicalgia   2. Cervical spondylosis without myelopathy   3. Myofascial pain syndrome   4. Scoliosis of thoracolumbar spine, unspecified scoliosis type   5. Post laminectomy syndrome   6. Chronic pain syndrome     Plan: Findings:  Chronic history of spine pain with  scoliosis thoracolumbar and now with worsening recent neck pain and shoulder pain with a myotomal referral pattern up to the ear which is pretty classic for trigger points and myofascial pain particularly in the trapezius and levator scapular area.  We did complete trigger point injection today.  Depending on relief would look at facet joint block of C2-3 and C3-4 which does show arthritis on the MRI.  She will continue to follow-up with Dr. Lorin Mercy for her conservative care otherwise.    Meds & Orders:  Meds ordered this encounter  Medications  . methylPREDNISolone acetate (DEPO-MEDROL) injection 80 mg    Orders Placed This Encounter  Procedures  . Facet Injection  . Trigger Point Inj    Follow-up: Return if symptoms worsen or fail to improve.   Procedures: Trigger Point Inj Date/Time: 04/02/2018 5:51 AM Performed by: Magnus Sinning, MD Authorized by: Magnus Sinning, MD   Consent Given by:  Patient Site marked: the procedure site was marked   Timeout: prior to procedure the correct patient, procedure, and site was verified   Total # of Trigger Points:  3 or more Location: neck and back   Needle Size:  25 G Approach:  Dorsal Medications #1:  20 mg triamcinolone acetonide 40 MG/ML Medications #2:  3 mL lidocaine 1 % Additional Injections?: No   Patient tolerance:  Patient tolerated the procedure well with no immediate complications Comments: Trigger points were  palpated and marked in the levator scapula and trapezius and rhomboid in the upper angle of the scapula and superiorly.  This did reproduce her pain.  Needling technique was utilized.    No notes on file   Clinical History: MRI CERVICAL SPINE WITHOUT CONTRAST  TECHNIQUE: Multiplanar, multisequence MR imaging of the cervical spine was performed. No intravenous contrast was administered.  COMPARISON:  Radiography 03/02/2018.  FINDINGS: Alignment: 2 mm anterolisthesis C2-3 and C7-T1.  Vertebrae: Previous ACDF  C4-5 and C6-7.  Cord: No primary cord lesion.  No cord compression or edema.  Posterior Fossa, vertebral arteries, paraspinal tissues: Negative  Disc levels:  Foramen magnum is widely patent. There is osteoarthritis at the C1-2 articulation which could contribute to craniocervical pain. No encroachment upon the neural spaces.  C2-3: Facet degeneration on the left with 2 mm of anterolisthesis. No compressive central canal stenosis. Foraminal narrowing on the left that could affect the C3 nerve.  C3-4: Facet degeneration on the left. Endplate osteophytes and bulging of the disc. Narrowing of the subarachnoid space but no compression of the cord. AP diameter of the canal 8 mm. Foraminal narrowing on the left could affect the C4 nerve.  C4-5: Previous ACDF is solid. Wide patency of the canal and foramina.  C5-6: Small endplate osteophytes and mild bulging of the disc. No canal stenosis. Mild foraminal narrowing on the left because of osteophytic encroachment.  C6-7: Previous ACDF is solid. Wide patency of the canal and foramina.  C7-T1: Facet arthritis with 2 mm of anterolisthesis. No canal or foraminal stenosis.  Upper thoracic region shows ordinary facet arthritis and minor disc bulges but no compressive pathology.  IMPRESSION: Previous ACDF at C4-5 and at C6-7 with solid union and wide patency at those levels.  Osteoarthritis at the C1-2 articulation which could contribute to craniocervical pain.  Left-sided predominant facet arthropathy at C2-3 and C3-4. Left foraminal narrowing at those levels that would have potential to affect the exiting left C3 and C4 nerves.  Spondylosis and facet degeneration at C5-6 with mild left foraminal narrowing.  C7-T1 facet degeneration with 2 mm of anterolisthesis but no apparent compressive stenosis.   Electronically Signed   By: Nelson Chimes M.D.   On: 03/05/2018 09:35   She reports that she has never  smoked. She has never used smokeless tobacco. No results for input(s): HGBA1C, LABURIC in the last 8760 hours.  Objective:  VS:  HT:    WT:   BMI:     BP:138/79  HR:79bpm  TEMP: ( )  RESP:99 % Physical Exam  Constitutional: She is oriented to person, place, and time. She appears well-developed and well-nourished.  Eyes: Pupils are equal, round, and reactive to light. Conjunctivae and EOM are normal.  Cardiovascular: Normal rate and intact distal pulses.  Pulmonary/Chest: Effort normal.  Musculoskeletal:  Forward flexed cervical spine stiffness with rotation left and right and pain with extension but negative Spurling sign bilaterally.  Mild shoulder impingement but not reproducible of her pain.  She has good upper extremity strength bilaterally without deficit.  She has no clonus bilaterally.  She has active trigger points in the left levator scapula and trapezius and rhomboid.  Neurological: She is alert and oriented to person, place, and time. She exhibits normal muscle tone. Coordination normal.  Skin: Skin is warm and dry. No rash noted. No erythema.  Psychiatric: She has a normal mood and affect. Her behavior is normal.  Nursing note and vitals reviewed.   Ortho Exam Imaging:  No results found.  Past Medical/Family/Surgical/Social History: Medications & Allergies reviewed per EMR, new medications updated. Patient Active Problem List   Diagnosis Date Noted  . History of fusion of cervical spine 03/10/2018  . Neck pain 03/02/2018  . Age-related osteoporosis without current pathological fracture 01/01/2018  . Abnormal CXR 05/25/2017  . Hypertensive retinopathy 12/04/2015  . Chest pain with moderate risk for cardiac etiology 10/15/2015  . Scoliosis deformity of spine 10/15/2015  . Hyperlipidemia 10/16/2013  . Essential hypertension 10/16/2013  . Mitral valve prolapse 10/16/2013   Past Medical History:  Diagnosis Date  . AC (acromioclavicular) joint bone spurs 2008   Back    . Anemia   . Arthritis   . Blood transfusion   . Bronchitis   . COPD (chronic obstructive pulmonary disease) ([place])   . GERD (gastroesophageal reflux disease)    uses tums  . Hepatitis    1970's  . Hyperlipidemia   . Hypertension   . Mitral valve prolapse 10/16/2013  . MVP (mitral valve prolapse)    with mild to mod MR and TR 04/2012; followed by Dr. Rollene Fare [facility])  . Osteoporosis   . Sleep apnea    no treatment at this time, had sleep study apprx 8 years ago   Family History  Problem Relation Age of Onset  . Alzheimer's disease Mother   . Alzheimer's disease Father    Past Surgical History:  Procedure Laterality Date  . ABDOMINAL HYSTERECTOMY  1964  . ANTERIOR CERVICAL DECOMP/DISCECTOMY FUSION    . [place]  . HERNIA REPAIR    . INGUINAL HERNIA REPAIR  06/16/2012   Procedure: HERNIA REPAIR INGUINAL ADULT;  Surgeon: Madilyn Hook, DO;  Location: [facility];  Service: General;  Laterality: Right;  . KNEE ARTHROSCOPY  2007 & 2008  . NM MYOCAR PERF WALL MOTION  05/27/2011   normal  . SPINE SURGERY    . TONGUE BIOPSY  2000  . US ECHOCARDIOGRAPHY  06/07/2012   mild concentric LVH,LA mod. dilated,RA is mild to mod. dilated, mild MVP,prolapse of the posterior leaflet,mild mitral annular ca+,mild to mod MR & TR, AOV mildly sclerotic, aortic root sclerosis/ca+, insignificant pericardial effusion.  Marland Kitchen UVULECTOMY     Social History   Occupational History  . Not on file  Tobacco Use  . Smoking status: Never Smoker  . Smokeless tobacco: Never Used  Substance and Sexual Activity  . Alcohol use: Yes    Comment: occassional  . Drug use: No  . Sexual activity: Not on file

## 2018-06-08 ENCOUNTER — Ambulatory Visit (INDEPENDENT_AMBULATORY_CARE_PROVIDER_SITE_OTHER): Payer: Medicare Other | Admitting: Orthopaedic Surgery

## 2018-06-18 ENCOUNTER — Telehealth (INDEPENDENT_AMBULATORY_CARE_PROVIDER_SITE_OTHER): Payer: Self-pay

## 2018-06-18 ENCOUNTER — Telehealth (INDEPENDENT_AMBULATORY_CARE_PROVIDER_SITE_OTHER): Payer: Self-pay | Admitting: Physician Assistant

## 2018-06-18 ENCOUNTER — Other Ambulatory Visit (INDEPENDENT_AMBULATORY_CARE_PROVIDER_SITE_OTHER): Payer: Self-pay

## 2018-06-18 MED ORDER — MELOXICAM 7.5 MG PO TABS: 7.5000 mg | ORAL_TABLET | Freq: Every day | ORAL | 0 refills | Status: DC

## 2018-06-18 NOTE — Telephone Encounter (Signed)
Estill Bamberg a pharmacist at [facility] called in regards to a prescription for patient that was written back in April. Stated that the prednisone rx written in April was for dosepak #21 tab with 21 refills. She was asking if this was an error or were there refills allowed on this? I advised that it seemed to be an error but would have to discuss with patients treating physician before giving approval. Advised will not get response (due to provider not being in the office) until early next week.  Please call her and advise. Thanks. [phone]

## 2018-06-18 NOTE — Telephone Encounter (Signed)
FYI patient called back, had very lengthy conversation with her about refill on her prednisone (see other notes as well)  I sent in a rx for mobic for patient because she was demanding something be done today. Said she was very upset that she could not get refill on prednisone. She stated that I should be able to approve this for her. Advised multiple times I was not a provider and was unable to [place] this. Also advised per previous notes that we could give anti-inflammatory. She stated that she did not understand what the issue was because Dr Lorin Mercy would not have a problem with refilling it. I advised since he was not here we had to do what the on call provider said. She wanted to make sure that Dr Lorin Mercy was aware and that she would be calling him on Tuesday when he returned back to the office.

## 2018-06-18 NOTE — Telephone Encounter (Signed)
Although patient is unhappy with not receiving the same thing as before she is okay with receiving the low dose antiinflammatory. She said anything at this point would help. Thanks!

## 2018-06-18 NOTE — Telephone Encounter (Signed)
Don't want to repeat steroid pack, but happy to call in low dose antiinflammatory

## 2018-06-18 NOTE — Telephone Encounter (Signed)
Can you advise on refill for her?

## 2018-06-18 NOTE — Telephone Encounter (Signed)
Please send in Rx. Thanks

## 2018-06-18 NOTE — Telephone Encounter (Signed)
Mendel Ryder prescribed methylprednisolone to this patient back in April for neck pain and it really helped the patient. She believes she over worked it and is now in a lot of pain. She is requesting a refill of this be sent to [facility]. Patient would like a call back once this is sent # [phone]

## 2018-06-18 NOTE — Telephone Encounter (Signed)
Sounds good thanks

## 2018-06-20 ENCOUNTER — Other Ambulatory Visit: Payer: Self-pay | Admitting: Cardiovascular Disease

## 2018-06-21 ENCOUNTER — Ambulatory Visit (INDEPENDENT_AMBULATORY_CARE_PROVIDER_SITE_OTHER): Payer: Self-pay

## 2018-06-21 ENCOUNTER — Encounter (INDEPENDENT_AMBULATORY_CARE_PROVIDER_SITE_OTHER): Payer: Self-pay | Admitting: Physical Medicine and Rehabilitation

## 2018-06-21 ENCOUNTER — Encounter (INDEPENDENT_AMBULATORY_CARE_PROVIDER_SITE_OTHER): Payer: Self-pay | Admitting: Surgery

## 2018-06-21 ENCOUNTER — Telehealth (INDEPENDENT_AMBULATORY_CARE_PROVIDER_SITE_OTHER): Payer: Self-pay | Admitting: *Deleted

## 2018-06-21 ENCOUNTER — Ambulatory Visit (INDEPENDENT_AMBULATORY_CARE_PROVIDER_SITE_OTHER): Payer: Medicare Other | Admitting: Physical Medicine and Rehabilitation

## 2018-06-21 ENCOUNTER — Ambulatory Visit (INDEPENDENT_AMBULATORY_CARE_PROVIDER_SITE_OTHER): Payer: Medicare Other | Admitting: Surgery

## 2018-06-21 VITALS — BP 128/66 | HR 93

## 2018-06-21 DIAGNOSIS — M47812 Spondylosis without myelopathy or radiculopathy, cervical region: Secondary | ICD-10-CM | POA: Diagnosis not present

## 2018-06-21 DIAGNOSIS — M542 Cervicalgia: Secondary | ICD-10-CM

## 2018-06-21 MED ORDER — METHYLPREDNISOLONE ACETATE 80 MG/ML IJ SUSP
80.0000 mg | Freq: Once | INTRAMUSCULAR | Status: AC
  Administered 2018-06-21: 80 mg

## 2018-06-21 NOTE — Telephone Encounter (Signed)
Patient called numerous times and then came in and saw Jeneen Rinks for an appointment. She is scheduled to see Dr. Ernestina Patches this afternoon. Prior message sent to Dr. Lorin Mercy for review.

## 2018-06-21 NOTE — Progress Notes (Signed)
Numeric Pain Rating Scale and Functional Assessment Average Pain 9   In the last MONTH (on 0-10 scale) has pain interfered with the following?  1. General activity like being  able to carry out your everyday physical activities such as walking, climbing stairs, carrying groceries, or moving a chair?  Rating(6)    +Driver, -BT, -Dye Allergies. 

## 2018-06-21 NOTE — Progress Notes (Signed)
Patient comes in today with complaints of left-sided neck pain.  Dr. Lorin Mercy had scheduled her for left C2-3 and C3-4 facet injections with Dr. Ernestina Patches felt that most of her pain was myofascial and performed a left trigger point injection.  States that she did not get any relief of her pain with that.  Symptoms unchanged from previous visit.  I did speak with Dr. Ernestina Patches and he has a cancellation this afternoon at 245.  He will perform the left C2-3 and C3-4 facet injections.  Follow up with Dr. Lorin Mercy a couple weeks after to check her response.

## 2018-06-21 NOTE — Telephone Encounter (Signed)
Per pt insurance with [facility], per website prior Caitlyn [name] is not required for procedure. Pt is scheduled 06/21/18 with driver.

## 2018-06-21 NOTE — Telephone Encounter (Signed)
Caitlyn [name] saw patient on 06/21/18 and she was going to see Dr Ernestina Patches for cervical facet injections this afternoon.

## 2018-06-21 NOTE — Progress Notes (Signed)
Caitlyn [name] - 84 y.o. female MRN [id number]  Date of birth: December 09, 1929  Office Visit Note: Visit Date: 06/21/2018 PCP: Hulan Fess, MD Referred by: Hulan Fess, MD  Subjective: Chief Complaint  Patient presents with  . Neck - Pain  . Left Shoulder - Pain   HPI: Caitlyn [name] is a pleasant 84 year old female that we have seen in the past for chronic neck pain through Dr. Rodell Perna.  She came in today with worsening severe left-sided neck and upper back pain.  Caitlyn [name] who follows her as well wanted to see if we can work her in today for evaluation management and possible procedure.  The last time I saw her was more than 5 months ago and we completed trigger point injection into the trapezius as well as supraspinatus and levator scapula with decent relief of symptoms.  She reports to me today that she is having worsening symptoms of the knots in the upper back from a lot of tension and stress which is been increased with [place]  She also reports neck pain worse with left rotation and extension.  She has known osteoarthritis of the cervical spine.  She is not getting any pain down the arms or numbness or tingling or paresthesias.  She has had no focal weakness no new injuries.  No trauma.  No associated headaches.  She does feel like the tension in the neck though has really just made her feel odd and different.  She is having trouble sleeping.   Review of Systems  Constitutional: Negative for chills, fever, malaise/fatigue and weight loss.  HENT: Negative for hearing loss and sinus pain.   Eyes: Negative for blurred vision, double vision and photophobia.  Respiratory: Negative for cough and shortness of breath.   Cardiovascular: Negative for chest pain, palpitations and leg swelling.  Gastrointestinal: Negative for abdominal pain, nausea and vomiting.  Genitourinary: Negative for flank pain.  Musculoskeletal: Positive for back pain, joint  pain and neck pain. Negative for myalgias.  Skin: Negative for itching and rash.  Neurological: Negative for tremors, focal weakness and weakness.  Endo/Heme/Allergies: Negative.   Psychiatric/Behavioral: Negative for depression.  All other systems reviewed and are negative.  Otherwise per HPI.  Assessment & Plan: Visit Diagnoses:  1. Cervical spondylosis without myelopathy   2. Cervicalgia     Plan: Findings:  1.  Myofascial trigger point type pain of the levator scapula and trapezius and supraspinatus on the left more than right although there is palpable trigger points on the right as well they do not reproduce her pain.  We are going to complete a localized trigger point injection of these 3 muscles and this is helped in the past for the upper back pain.  She has not had dry needling in the past and it may be worth regrouping with physical therapy at [facility] physical therapy for dry needling.  We did talk about stress management and she does state that the townhouse problem is basically going away as it will not close on schedule.  2.  Axial neck pain worse with left rotation and extension which seems to be related to upper cervical spine arthritis.  In the past we have completed facet joint blocks which were very beneficial.  We did not repeat that today from a diagnostic and hopefully therapeutic standpoint.  This should be using fluoroscopic guidance and doing a left C2-3 and C3-4 facet joint block above her anterior cervical  fusion.    Meds & Orders:  Meds ordered this encounter  Medications  . methylPREDNISolone acetate (DEPO-MEDROL) injection 80 mg    Orders Placed This Encounter  Procedures  . Facet Injection  . Trigger Point Inj  . XR C-ARM NO REPORT    Follow-up: Return if symptoms worsen or fail to improve.   Procedures: Trigger Point Inj Date/Time: 06/21/2018 3:10 PM Performed by: Magnus Sinning, MD Authorized by: Magnus Sinning, MD   Consent Given by:   Patient Site marked: the procedure site was marked   Timeout: prior to procedure the correct patient, procedure, and site was verified   Total # of Trigger Points:  3 or more Location: neck and back   Needle Size:  25 G Approach:  Dorsal Medications #1:  20 mg triamcinolone acetonide 40 MG/ML Medications #2:  3 mL lidocaine 1 % Additional Injections?: No   Patient tolerance:  Patient tolerated the procedure well with no immediate complications Comments: Levator scap, trap and supraspinatus    Diagnostic Cervical Facet Joint Nerve Block with Fluoroscopic Guidance  Patient: Caitlyn [name]      Date of Birth: [birth year] MRN: [id number] PCP: Hulan Fess, MD      Visit Date: 06/21/2018   Universal Protocol:    Date/Time: 08/13/196:09 AM  Consent Given By: the patient  Position: LATERAL  Additional Comments: Vital signs were monitored before and after the procedure. Patient was prepped and draped in the usual sterile fashion. The correct patient, procedure, and site was verified.   Injection Procedure Details:  Procedure Site One Meds Administered:  Meds ordered this encounter  Medications  . methylPREDNISolone acetate (DEPO-MEDROL) injection 80 mg     Laterality: Left  Location/Site:  C2-3 C3-4  Needle size: 25 G  Needle Placement: Articular Pillar  Findings:  -Contrast Used: 0.5 mL iohexol 180 mg iodine/mL   -Comments: Excellent flow of contrast across the articular pillar.  Procedure Details: The fluoroscope beam was manipulated to achieve the best "true" lateral view possible by squaring off the endplates with cranial and caudal tilt and using varying obliquity to achieve the a view with the longest length of spinous process.  The region overlying the facet joints mentioned above were then localized under fluoroscopic visualization.  The needle was inserted down to the center of the "trapezoid" outline of the facet joint lateral mass. Bi-planar images were  used for confirming placement and spot radiographs were documented.  A 0.25 ml volume of Omnipaque-240 was injected to look for vascular uptake. A 0.5 ml. volume of the anesthetic solution was injected onto the target. This procedure was repeated for each medial branch nerve injected.  Prior to the procedure, the patient was given a Pain Diary which was completed for baseline measurements.  After the procedure, the patient rated their pain every 30 minutes and will continue rating at this frequency for a total of 5 hours.  The patient has been asked to complete the Diary and return to [place] by mail, fax or hand delivered as soon as possible.   Additional Comments:  The patient tolerated the procedure well Dressing: Band-Aid    Post-procedure details: Patient was observed during the procedure. Post-procedure instructions were reviewed.  Patient left the clinic in stable condition.     Clinical History: MRI CERVICAL SPINE WITHOUT CONTRAST  TECHNIQUE: Multiplanar, multisequence MR imaging of the cervical spine was performed. No intravenous contrast was administered.  COMPARISON:  Radiography 03/02/2018.  FINDINGS: Alignment: 2 mm anterolisthesis C2-3 and  C7-T1.  Vertebrae: Previous ACDF C4-5 and C6-7.  Cord: No primary cord lesion.  No cord compression or edema.  Posterior Fossa, vertebral arteries, paraspinal tissues: Negative  Disc levels:  Foramen magnum is widely patent. There is osteoarthritis at the C1-2 articulation which could contribute to craniocervical pain. No encroachment upon the neural spaces.  C2-3: Facet degeneration on the left with 2 mm of anterolisthesis. No compressive central canal stenosis. Foraminal narrowing on the left that could affect the C3 nerve.  C3-4: Facet degeneration on the left. Endplate osteophytes and bulging of the disc. Narrowing of the subarachnoid space but no compression of the cord. AP diameter of the canal 8 mm.  Foraminal narrowing on the left could affect the C4 nerve.  C4-5: Previous ACDF is solid. Wide patency of the canal and foramina.  C5-6: Small endplate osteophytes and mild bulging of the disc. No canal stenosis. Mild foraminal narrowing on the left because of osteophytic encroachment.  C6-7: Previous ACDF is solid. Wide patency of the canal and foramina.  C7-T1: Facet arthritis with 2 mm of anterolisthesis. No canal or foraminal stenosis.  Upper thoracic region shows ordinary facet arthritis and minor disc bulges but no compressive pathology.  IMPRESSION: Previous ACDF at C4-5 and at C6-7 with solid union and wide patency at those levels.  Osteoarthritis at the C1-2 articulation which could contribute to craniocervical pain.  Left-sided predominant facet arthropathy at C2-3 and C3-4. Left foraminal narrowing at those levels that would have potential to affect the exiting left C3 and C4 nerves.  Spondylosis and facet degeneration at C5-6 with mild left foraminal narrowing.  C7-T1 facet degeneration with 2 mm of anterolisthesis but no apparent compressive stenosis.   Electronically Signed   By: Nelson Chimes M.D.   On: 03/05/2018 09:35   She reports that she has never smoked. She has never used smokeless tobacco. No results for input(s): HGBA1C, LABURIC in the last 8760 hours.  Objective:  VS:  HT:    WT:   BMI:     BP:128/66  HR:93bpm  TEMP: ( )  RESP:  Physical Exam  Constitutional: She is oriented to person, place, and time. She appears well-developed and well-nourished.  Eyes: Pupils are equal, round, and reactive to light. Conjunctivae and EOM are normal.  Neck: Neck supple. No JVD present. No tracheal deviation present.  Cardiovascular: Normal rate and intact distal pulses.  Pulmonary/Chest: Effort normal.  Musculoskeletal:  Patient sits with forward flexed cervical spine she has worsening with extension rotation left more than right and really  more with rotation than flexion or extension.  She gets some tightness in the upper back muscles with forward flexion and left rotation.  She has palpable trigger points in the levator scapula and supraspinatus and trapezius which do reproduce her pain in the upper back.  She has some impingement sign of the shoulder but it does not reproduce her pain.  She has good strength in the upper extremities bilaterally.  Neurological: She is alert and oriented to person, place, and time. She exhibits normal muscle tone.  Skin: Skin is warm and dry. No rash noted. No erythema.  Psychiatric: She has a normal mood and affect. Her behavior is normal.  Nursing note and vitals reviewed.   Ortho Exam Imaging: Xr C-arm No Report  Result Date: 06/21/2018 Please see Notes tab for imaging impression.   Past Medical/Family/Surgical/Social History: Medications & Allergies reviewed per EMR, new medications updated. Patient Active Problem List   Diagnosis Date  Noted  . History of fusion of cervical spine 03/10/2018  . Neck pain 03/02/2018  . Age-related osteoporosis without current pathological fracture 01/01/2018  . Abnormal CXR 05/25/2017  . Hypertensive retinopathy 12/04/2015  . Chest pain with moderate risk for cardiac etiology 10/15/2015  . Scoliosis deformity of spine 10/15/2015  . Hyperlipidemia 10/16/2013  . Essential hypertension 10/16/2013  . Mitral valve prolapse 10/16/2013   Past Medical History:  Diagnosis Date  . AC (acromioclavicular) joint bone spurs 2008   Back  . Anemia   . Arthritis   . Blood transfusion   . Bronchitis   . COPD (chronic obstructive pulmonary disease) ([place])   . GERD (gastroesophageal reflux disease)    uses tums  . Hepatitis    1970's  . Hyperlipidemia   . Hypertension   . Mitral valve prolapse 10/16/2013  . MVP (mitral valve prolapse)    with mild to mod MR and TR 04/2012; followed by Dr. Rollene Fare [facility])  . Osteoporosis   . Sleep apnea    no treatment at  this time, had sleep study apprx 8 years ago   Family History  Problem Relation Age of Onset  . Alzheimer's disease Mother   . Alzheimer's disease Father    Past Surgical History:  Procedure Laterality Date  . ABDOMINAL HYSTERECTOMY  1964  . ANTERIOR CERVICAL DECOMP/DISCECTOMY FUSION    . [place]  . HERNIA REPAIR    . INGUINAL HERNIA REPAIR  06/16/2012   Procedure: HERNIA REPAIR INGUINAL ADULT;  Surgeon: Madilyn Hook, DO;  Location: [place];  Service: General;  Laterality: Right;  . KNEE ARTHROSCOPY  2007 & 2008  . NM MYOCAR PERF WALL MOTION  05/27/2011   normal  . SPINE SURGERY    . TONGUE BIOPSY  2000  . US ECHOCARDIOGRAPHY  06/07/2012   mild concentric LVH,LA mod. dilated,RA is mild to mod. dilated, mild MVP,prolapse of the posterior leaflet,mild mitral annular ca+,mild to mod MR & TR, AOV mildly sclerotic, aortic root sclerosis/ca+, insignificant pericardial effusion.  Marland Kitchen UVULECTOMY     Social History   Occupational History  . Not on file  Tobacco Use  . Smoking status: Never Smoker  . Smokeless tobacco: Never Used  Substance and Sexual Activity  . Alcohol use: Yes    Comment: occassional  . Drug use: No  . Sexual activity: Not on file

## 2018-06-21 NOTE — Patient Instructions (Signed)

## 2018-06-22 ENCOUNTER — Telehealth: Payer: Self-pay | Admitting: Cardiovascular Disease

## 2018-06-22 ENCOUNTER — Other Ambulatory Visit: Payer: Self-pay

## 2018-06-22 MED ORDER — LIDOCAINE HCL 1 % IJ SOLN
3.0000 mL | INTRAMUSCULAR | Status: AC | PRN
  Administered 2018-06-21: 3 mL

## 2018-06-22 MED ORDER — TRIAMCINOLONE ACETONIDE 40 MG/ML IJ SUSP
20.0000 mg | INTRAMUSCULAR | Status: AC | PRN
  Administered 2018-06-21: 20 mg via INTRAMUSCULAR

## 2018-06-22 MED ORDER — HYDROCHLOROTHIAZIDE 12.5 MG PO CAPS: 12.5000 mg | ORAL_CAPSULE | Freq: Every day | ORAL | 3 refills | Status: DC

## 2018-06-22 NOTE — Telephone Encounter (Signed)
Ok thanks 

## 2018-06-22 NOTE — Telephone Encounter (Signed)
New Message:        *STAT* If patient is at the pharmacy, call can be transferred to refill team.   1. Which medications need to be refilled? (please list name of each medication and dose if known) hydrochlorothiazide (MICROZIDE) 12.5 MG capsule  2. Which pharmacy/location (including street and city if local pharmacy) is medication to be sent to?[place], [place] [address]  3. Do they need a 30 day or 90 day supply? [place]

## 2018-06-22 NOTE — Procedures (Signed)
Diagnostic Cervical Facet Joint Nerve Block with Fluoroscopic Guidance  Patient: Caitlyn [name]      Date of Birth: June 20, 1930 MRN: [id number] PCP: Hulan Fess, MD      Visit Date: 06/21/2018   Universal Protocol:    Date/Time: 08/13/196:09 AM  Consent Given By: the patient  Position: LATERAL  Additional Comments: Vital signs were monitored before and after the procedure. Patient was prepped and draped in the usual sterile fashion. The correct patient, procedure, and site was verified.   Injection Procedure Details:  Procedure Site One Meds Administered:  Meds ordered this encounter  Medications  . methylPREDNISolone acetate (DEPO-MEDROL) injection 80 mg     Laterality: Left  Location/Site:  C2-3 C3-4  Needle size: 25 G  Needle Placement: Articular Pillar  Findings:  -Contrast Used: 0.5 mL iohexol 180 mg iodine/mL   -Comments: Excellent flow of contrast across the articular pillar.  Procedure Details: The fluoroscope beam was manipulated to achieve the best "true" lateral view possible by squaring off the endplates with cranial and caudal tilt and using varying obliquity to achieve the a view with the longest length of spinous process.  The region overlying the facet joints mentioned above were then localized under fluoroscopic visualization.  The needle was inserted down to the center of the "trapezoid" outline of the facet joint lateral mass. Bi-planar images were used for confirming placement and spot radiographs were documented.  A 0.25 ml volume of Omnipaque-240 was injected to look for vascular uptake. A 0.5 ml. volume of the anesthetic solution was injected onto the target. This procedure was repeated for each medial branch nerve injected.  Prior to the procedure, the patient was given a Pain Diary which was completed for baseline measurements.  After the procedure, the patient rated their pain every 30 minutes and will continue rating at this frequency for a  total of 5 hours.  The patient has been asked to complete the Diary and return to [place] by mail, fax or hand delivered as soon as possible.   Additional Comments:  The patient tolerated the procedure well Dressing: Band-Aid    Post-procedure details: Patient was observed during the procedure. Post-procedure instructions were reviewed.  Patient left the clinic in stable condition.

## 2018-08-10 ENCOUNTER — Ambulatory Visit (INDEPENDENT_AMBULATORY_CARE_PROVIDER_SITE_OTHER): Payer: Medicare Other | Admitting: Orthopaedic Surgery

## 2018-08-10 ENCOUNTER — Encounter (INDEPENDENT_AMBULATORY_CARE_PROVIDER_SITE_OTHER): Payer: Self-pay | Admitting: Orthopaedic Surgery

## 2018-08-10 VITALS — BP 127/78 | HR 89 | Ht 64.5 in | Wt 129.8 lb

## 2018-08-10 DIAGNOSIS — M810 Age-related osteoporosis without current pathological fracture: Secondary | ICD-10-CM

## 2018-08-10 DIAGNOSIS — Z981 Arthrodesis status: Secondary | ICD-10-CM | POA: Diagnosis not present

## 2018-08-10 DIAGNOSIS — M4126 Other idiopathic scoliosis, lumbar region: Secondary | ICD-10-CM | POA: Diagnosis not present

## 2018-08-10 NOTE — Progress Notes (Signed)
Office Visit Note   Patient: Caitlyn [name]           Date of Birth: 1930/09/19           MRN: [id number] Visit Date: 08/10/2018              Requested by: Hulan Fess, MD [place], [place] [address] PCP: Hulan Fess, MD   Assessment & Plan: Visit Diagnoses:  1. Other idiopathic scoliosis, lumbar region   2. History of fusion of cervical spine   3. Age-related osteoporosis without current pathological fracture     Plan: She will continue her walking program and exercise as tolerated.  If she has increased symptoms at some point in future she can call if she would like another epidural injection in the lumbar spine with Dr. Ernestina Patches.  Currently her symptoms have gotten a little bit worse due to stress related to potential home sale.  No changes in her medications.  Return PRN.  Follow-Up Instructions: No follow-ups on file.   Orders:  No orders of the defined types were placed in this encounter.  No orders of the defined types were placed in this encounter.     Procedures: No procedures performed   Clinical Data: No additional findings.   Subjective: Chief Complaint  Patient presents with  . Right Ankle - Pain, Edema  . Left Ankle - Pain, Edema  . Lower Back - Pain    HPI 84 year old female returns and states she is been having some problems with some numbness in her feet occasional swelling in her right ankle.  She is been using some CBD oil which seems to help.  She has known scoliosis with lateral recess narrowing in the lumbar spine and is occasionally had epidurals with Dr. Ernestina Patches which have done well.  Previous to level cervical fusion with adjacent level changes.  Recent neck injection in the last year continues to do well.  She is able ambulate she denies neurogenic claudication symptoms sometimes she feels tired and does not feel like she has a lot of energy.  She is had some increased stress over her sale of a house to a bar that had mental  problems.  She denies associated bowel or bladder symptoms but does have problems with chronic constipation.  For fever chills.  Review of Systems 14 point review of systems positive for scoliosis, hypertensive retinopathy, hypertension, mitral valve prolapse hyperlipidemia.  History of cervical fusion.  The last year problems with hemorrhoids, she is on injection treatment twice a year for osteoporosis.   Objective: Vital Signs: BP 127/78 (BP Location: Left Arm, Patient Position: Sitting, Cuff Size: Normal)   Pulse 89   Ht 5' 4.5" (1.638 m)   Wt 129 lb 12.8 oz (58.9 kg)   BMI 21.94 kg/m   Physical Exam  Constitutional: She is oriented to person, place, and time. She appears well-developed.  HENT:  Head: Normocephalic.  Right Ear: External ear normal.  Left Ear: External ear normal.  Eyes: Pupils are equal, round, and reactive to light.  Neck: No tracheal deviation present. No thyromegaly present.  Cardiovascular: Normal rate.  Pulmonary/Chest: Effort normal.  Abdominal: Soft.  Neurological: She is alert and oriented to person, place, and time.  Skin: Skin is warm and dry.  Psychiatric: She has a normal mood and affect. Her behavior is normal.    Ortho Exam patient has mild swelling of both ankles which are symmetrical which is trace.  No significant venous  stasis changes.  Anterior tib gastrocsoleus is strong normal heel toe gait.  Significant lumbar scoliosis.  Left thoracic right lumbar curve with minimal pelvic obliquity.  Quads are strong.  She has some tenderness over both greater trochanters as well as sciatic notch.  No lower extremity atrophy.  Specialty Comments:  No specialty comments available.  Imaging: No results found.   PMFS History: Patient Active Problem List   Diagnosis Date Noted  . History of fusion of cervical spine 03/10/2018  . Neck pain 03/02/2018  . Age-related osteoporosis without current pathological fracture 01/01/2018  . Abnormal CXR  05/25/2017  . Hypertensive retinopathy 12/04/2015  . Chest pain with moderate risk for cardiac etiology 10/15/2015  . Scoliosis deformity of spine 10/15/2015  . Hyperlipidemia 10/16/2013  . Essential hypertension 10/16/2013  . Mitral valve prolapse 10/16/2013   Past Medical History:  Diagnosis Date  . AC (acromioclavicular) joint bone spurs 2008   Back  . Anemia   . Arthritis   . Blood transfusion   . Bronchitis   . COPD (chronic obstructive pulmonary disease) ([place])   . GERD (gastroesophageal reflux disease)    uses tums  . Hepatitis    1970's  . Hyperlipidemia   . Hypertension   . Mitral valve prolapse 10/16/2013  . MVP (mitral valve prolapse)    with mild to mod MR and TR 04/2012; followed by Dr. Rollene Fare [facility])  . Osteoporosis   . Sleep apnea    no treatment at this time, had sleep study apprx 8 years ago    Family History  Problem Relation Age of Onset  . Alzheimer's disease Mother   . Alzheimer's disease Father     Past Surgical History:  Procedure Laterality Date  . ABDOMINAL HYSTERECTOMY  1964  . ANTERIOR CERVICAL DECOMP/DISCECTOMY FUSION    . [place]  . HERNIA REPAIR    . INGUINAL HERNIA REPAIR  06/16/2012   Procedure: HERNIA REPAIR INGUINAL ADULT;  Surgeon: Madilyn Hook, DO;  Location: [place];  Service: General;  Laterality: Right;  . KNEE ARTHROSCOPY  2007 & 2008  . NM MYOCAR PERF WALL MOTION  05/27/2011   normal  . SPINE SURGERY    . TONGUE BIOPSY  2000  . US ECHOCARDIOGRAPHY  06/07/2012   mild concentric LVH,LA mod. dilated,RA is mild to mod. dilated, mild MVP,prolapse of the posterior leaflet,mild mitral annular ca+,mild to mod MR & TR, AOV mildly sclerotic, aortic root sclerosis/ca+, insignificant pericardial effusion.  Marland Kitchen UVULECTOMY     Social History   Occupational History  . Not on file  Tobacco Use  . Smoking status: Never Smoker  . Smokeless tobacco: Never Used  Substance and Sexual Activity  . Alcohol use: Yes     Comment: occassional  . Drug use: No  . Sexual activity: Not on file

## 2018-09-06 ENCOUNTER — Encounter (INDEPENDENT_AMBULATORY_CARE_PROVIDER_SITE_OTHER): Payer: Medicare Other | Admitting: Ophthalmology

## 2018-09-06 DIAGNOSIS — H348312 Tributary (branch) retinal vein occlusion, right eye, stable: Secondary | ICD-10-CM | POA: Diagnosis not present

## 2018-09-06 DIAGNOSIS — H43813 Vitreous degeneration, bilateral: Secondary | ICD-10-CM

## 2018-09-06 DIAGNOSIS — I10 Essential (primary) hypertension: Secondary | ICD-10-CM | POA: Diagnosis not present

## 2018-09-06 DIAGNOSIS — H35342 Macular cyst, hole, or pseudohole, left eye: Secondary | ICD-10-CM | POA: Diagnosis not present

## 2018-09-06 DIAGNOSIS — H35033 Hypertensive retinopathy, bilateral: Secondary | ICD-10-CM

## 2018-09-21 ENCOUNTER — Other Ambulatory Visit: Payer: Self-pay | Admitting: Gastroenterology

## 2018-09-21 DIAGNOSIS — K5900 Constipation, unspecified: Secondary | ICD-10-CM

## 2018-09-21 DIAGNOSIS — R1030 Lower abdominal pain, unspecified: Secondary | ICD-10-CM

## 2018-09-23 ENCOUNTER — Ambulatory Visit
Admission: RE | Admit: 2018-09-23 | Discharge: 2018-09-23 | Disposition: A | Discharge status: Home / Self Care | Payer: Medicare Other | Source: Ambulatory Visit | Attending: Gastroenterology | Admitting: Gastroenterology

## 2018-09-23 DIAGNOSIS — R1030 Lower abdominal pain, unspecified: Secondary | ICD-10-CM

## 2018-09-23 DIAGNOSIS — K5900 Constipation, unspecified: Secondary | ICD-10-CM

## 2018-09-23 MED ORDER — IOPAMIDOL (ISOVUE-300) INJECTION 61%
100.0000 mL | Freq: Once | INTRAVENOUS | Status: AC | PRN
  Administered 2018-09-23: 100 mL via INTRAVENOUS

## 2018-09-27 ENCOUNTER — Telehealth: Payer: Self-pay | Admitting: Internal Medicine

## 2018-09-27 NOTE — Telephone Encounter (Signed)
Called and spoke to patient, requesting results of CT. Informed patient that we did not order the CT so the results would go to the ordering MD. Patient voices she got confused and will call ordering MD office. Nothing further is needed at this time.

## 2018-12-06 ENCOUNTER — Telehealth (INDEPENDENT_AMBULATORY_CARE_PROVIDER_SITE_OTHER): Payer: Self-pay | Admitting: *Deleted

## 2018-12-06 NOTE — Telephone Encounter (Signed)
Likely could repeat last injection if it helped but obviously not can get her in before 1/29

## 2018-12-06 NOTE — Telephone Encounter (Signed)
Called pt and she states she is not in that much pain for injection, and will call back to schedule appt.   Notification or Prior Authorization is not required for the requested services  This [employer] members plan does not currently require a prior authorization for [phone]  Decision ID #:[id number]

## 2018-12-08 ENCOUNTER — Ambulatory Visit (INDEPENDENT_AMBULATORY_CARE_PROVIDER_SITE_OTHER): Payer: Medicare Other | Admitting: Orthopaedic Surgery

## 2018-12-17 ENCOUNTER — Other Ambulatory Visit: Payer: Self-pay | Admitting: Cardiovascular Disease

## 2018-12-28 ENCOUNTER — Encounter (INDEPENDENT_AMBULATORY_CARE_PROVIDER_SITE_OTHER): Payer: Self-pay | Admitting: Physical Medicine and Rehabilitation

## 2018-12-28 ENCOUNTER — Ambulatory Visit (INDEPENDENT_AMBULATORY_CARE_PROVIDER_SITE_OTHER): Payer: Self-pay

## 2018-12-28 ENCOUNTER — Encounter

## 2018-12-28 ENCOUNTER — Ambulatory Visit (INDEPENDENT_AMBULATORY_CARE_PROVIDER_SITE_OTHER): Payer: Medicare Other | Admitting: Physical Medicine and Rehabilitation

## 2018-12-28 VITALS — BP 156/91 | HR 108 | Temp 97.8°F

## 2018-12-28 DIAGNOSIS — M4126 Other idiopathic scoliosis, lumbar region: Secondary | ICD-10-CM | POA: Diagnosis not present

## 2018-12-28 DIAGNOSIS — M5416 Radiculopathy, lumbar region: Secondary | ICD-10-CM

## 2018-12-28 DIAGNOSIS — M48062 Spinal stenosis, lumbar region with neurogenic claudication: Secondary | ICD-10-CM

## 2018-12-28 MED ORDER — BETAMETHASONE SOD PHOS & ACET 6 (3-3) MG/ML IJ SUSP
12.0000 mg | Freq: Once | INTRAMUSCULAR | Status: AC
  Administered 2018-12-28: 12 mg

## 2018-12-28 NOTE — Procedures (Signed)
Lumbosacral Transforaminal Epidural Steroid Injection - Sub-Pedicular Approach with Fluoroscopic Guidance  Patient: Caitlyn [name]      Date of Birth: 15-Feb-1930 MRN: [id number] PCP: Hulan Fess, MD      Visit Date: 12/28/2018   Universal Protocol:    Date/Time: 12/28/2018  Consent Given By: the patient  Position: PRONE  Additional Comments: Vital signs were monitored before and after the procedure. Patient was prepped and draped in the usual sterile fashion. The correct patient, procedure, and site was verified.   Injection Procedure Details:  Procedure Site One Meds Administered:  Meds ordered this encounter  Medications  . betamethasone acetate-betamethasone sodium phosphate (CELESTONE) injection 12 mg    Laterality: Bilateral  Location/Site:  L4-L5  Needle size: 22 G  Needle type: Spinal  Needle Placement: Transforaminal  Findings:    -Comments: Excellent flow of contrast along the nerve and into the epidural space.  Procedure Details: After squaring off the end-plates to get a true AP view, the C-arm was positioned so that an oblique view of the foramen as noted above was visualized. The target area is just inferior to the "nose of the scotty dog" or sub pedicular. The soft tissues overlying this structure were infiltrated with 2-3 ml. of 1% Lidocaine without Epinephrine.  The spinal needle was inserted toward the target using a "trajectory" view along the fluoroscope beam.  Under AP and lateral visualization, the needle was advanced so it did not puncture dura and was located close the 6 O'Clock position of the pedical in AP tracterory. Biplanar projections were used to confirm position. Aspiration was confirmed to be negative for CSF and/or blood. A 1-2 ml. volume of Isovue-250 was injected and flow of contrast was noted at each level. Radiographs were obtained for documentation purposes.   After attaining the desired flow of contrast documented above, a 0.5 to  1.0 ml test dose of 0.25% Marcaine was injected into each respective transforaminal space.  The patient was observed for 90 seconds post injection.  After no sensory deficits were reported, and normal lower extremity motor function was noted,   the above injectate was administered so that equal amounts of the injectate were placed at each foramen (level) into the transforaminal epidural space.   Additional Comments:  The patient tolerated the procedure well Dressing: Band-Aid    Post-procedure details: Patient was observed during the procedure. Post-procedure instructions were reviewed.  Patient left the clinic in stable condition.

## 2018-12-28 NOTE — Progress Notes (Signed)
   Numeric Pain Rating Scale and Functional Assessment Average Pain 10   In the last MONTH (on 0-10 scale) has pain interfered with the following?  1. General activity like being  able to carry out your everyday physical activities such as walking, climbing stairs, carrying groceries, or moving a chair?  Rating(8)   +Driver, -BT, -Dye Allergies.  

## 2018-12-28 NOTE — Progress Notes (Signed)
Caitlyn [name] - 85 y.o. female MRN [id number]  Date of birth: 22-Mar-1930  Office Visit Note: Visit Date: 12/28/2018 PCP: Hulan Fess, MD Referred by: Hulan Fess, MD  Subjective: Chief Complaint  Patient presents with  . Lower Back - Pain   HPI: Caitlyn [name] is a 85 y.o. female who comes in today For evaluation management of low back pain and left radicular leg pain.  I have seen the patient on a few occasions over the last year or so mainly for cervicalgia and myofascial pain.  She is typically followed by Dr. Rodell Perna from an orthopedic standpoint.  She reports lower back pain radiating to the left hip and leg in L5 distribution.  This is worse with standing and ambulating.  She reports is been ongoing now for several months not really responding to medication or activity modification.  She has a history of lumbar spinal stenosis which is quite severe at L4-5 and less so at L3-4 with scoliosis.  Prior injection performed at L4 from a transforaminal approach about a year and a half ago gave her quite a bit of relief.  I do think this is consistent with her lumbar stenosis.  She has no focal weakness on exam.  ROS Otherwise per HPI.  Assessment & Plan: Visit Diagnoses:  1. Lumbar radiculopathy   2. Spinal stenosis of lumbar region with neurogenic claudication   3. Other idiopathic scoliosis, lumbar region     Plan: No additional findings.   Meds & Orders:  Meds ordered this encounter  Medications  . betamethasone acetate-betamethasone sodium phosphate (CELESTONE) injection 12 mg    Orders Placed This Encounter  Procedures  . XR C-ARM NO REPORT  . Epidural Steroid injection    Follow-up: Return if symptoms worsen or fail to improve.   Procedures: No procedures performed  Lumbosacral Transforaminal Epidural Steroid Injection - Sub-Pedicular Approach with Fluoroscopic Guidance  Patient: Caitlyn [name]      Date of Birth: 11-15-1929 MRN: [id number] PCP: Hulan Fess,  MD      Visit Date: 12/28/2018   Universal Protocol:    Date/Time: 12/28/2018  Consent Given By: the patient  Position: PRONE  Additional Comments: Vital signs were monitored before and after the procedure. Patient was prepped and draped in the usual sterile fashion. The correct patient, procedure, and site was verified.   Injection Procedure Details:  Procedure Site One Meds Administered:  Meds ordered this encounter  Medications  . betamethasone acetate-betamethasone sodium phosphate (CELESTONE) injection 12 mg    Laterality: Bilateral  Location/Site:  L4-L5  Needle size: 22 G  Needle type: Spinal  Needle Placement: Transforaminal  Findings:    -Comments: Excellent flow of contrast along the nerve and into the epidural space.  Procedure Details: After squaring off the end-plates to get a true AP view, the C-arm was positioned so that an oblique view of the foramen as noted above was visualized. The target area is just inferior to the "nose of the scotty dog" or sub pedicular. The soft tissues overlying this structure were infiltrated with 2-3 ml. of 1% Lidocaine without Epinephrine.  The spinal needle was inserted toward the target using a "trajectory" view along the fluoroscope beam.  Under AP and lateral visualization, the needle was advanced so it did not puncture dura and was located close the 6 O'Clock position of the pedical in AP tracterory. Biplanar projections were used to confirm position. Aspiration was confirmed to be negative for CSF and/or blood.  A 1-2 ml. volume of Isovue-250 was injected and flow of contrast was noted at each level. Radiographs were obtained for documentation purposes.   After attaining the desired flow of contrast documented above, a 0.5 to 1.0 ml test dose of 0.25% Marcaine was injected into each respective transforaminal space.  The patient was observed for 90 seconds post injection.  After no sensory deficits were reported, and normal  lower extremity motor function was noted,   the above injectate was administered so that equal amounts of the injectate were placed at each foramen (level) into the transforaminal epidural space.   Additional Comments:  The patient tolerated the procedure well Dressing: Band-Aid    Post-procedure details: Patient was observed during the procedure. Post-procedure instructions were reviewed.  Patient left the clinic in stable condition.    Clinical History: MRI LUMBAR SPINE WITHOUT AND WITH CONTRAST  TECHNIQUE: Multiplanar and multiecho pulse sequences of the lumbar spine were obtained without and with intravenous contrast.  CONTRAST:  76mL MULTIHANCE GADOBENATE DIMEGLUMINE 529 MG/ML IV SOLN  COMPARISON:  CT abdomen and pelvis 10/19/2012.  FINDINGS: Segmentation: Normal.  Alignment: 5 mm anterolisthesis L4-5. 3 mm anterolisthesis L3-4. 2 mm retrolisthesis L2-3 and L1-2. Marked scoliosis convex RIGHT estimated 40 degrees.  Vertebrae: Endplate reactive changes above and below L1-2 and L2-3. Old compression fracture L1 with Schmorl's node involving the superior endplate.  Conus medullaris: Normal in size, signal, and location.  Paraspinal tissues: No evidence for hydronephrosis or paravertebral mass.  Disc levels:  L1-L2: 2 mm retrolisthesis is facet mediated. Mild to moderate disc space narrowing. Mild annular bulging. LEFT greater than RIGHT facet arthropathy. LEFT greater than RIGHT subarticular zone narrowing and foraminal zone narrowing.  L2-L3: 2 mm retrolisthesis. Mild to moderate disc space narrowing. Mild annular bulging mild annular bulging. LEFT greater than RIGHT facet arthropathy. LEFT greater than RIGHT subarticular zone narrowing foraminal zone narrowing. Mild central canal stenosis. LEFT L2 and LEFT L3 nerve root impingement.  L3-L4: Mild to moderate disc space narrowing. 3 mm anterolisthesis. Advanced facet and ligamentum flavum  hypertrophy. Severe central canal stenosis. BILATERAL subarticular zone and foraminal zone narrowing affecting the L3 and L4 nerve roots.  L4-L5: Moderate to severe disc space narrowing. 5 mm anterolisthesis. Central protrusion. Moderate facet ligamentum flavum hypertrophy. Prior laminotomies. Severe central canal stenosis. BILATERAL L4 and L5 nerve root impingement.  L5-S1: Moderate disc space narrowing. Shallow central protrusion. BILATERAL facet arthropathy. RIGHT foraminal narrowing affects the L5 nerve root.  No worrisome abnormal postcontrast enhancement. Moderate enhancement in the dorsal soft tissues related to facet disease and postsurgical change, is observed at L3-4 and L4-5.  IMPRESSION: Potentially symptomatic RIGHT-sided neural impingement at L5-S1 (foraminal), L3-4, and L4-5. See comments above.  40 degree scoliosis convex RIGHT mid lumbar region.  Asymmetric subarticular zone and foraminal zone narrowing affects the LEFT-sided nerve roots at L1-2 and L2-3.   Electronically Signed   By: Rolla Flatten M.D.   On: 02/05/2015 14:37   She reports that she has never smoked. She has never used smokeless tobacco. No results for input(s): HGBA1C, LABURIC in the last 8760 hours.  Objective:  VS:  HT:    WT:   BMI:     BP:(!) 156/91  HR:(!) 108bpm  TEMP:97.8 F (36.6 C)(Oral)  RESP:  Physical Exam  Ortho Exam Imaging: Xr C-arm No Report  Result Date: 12/28/2018 Please see Notes tab for imaging impression.   Past Medical/Family/Surgical/Social History: Medications & Allergies reviewed per EMR, new medications updated. Patient  Active Problem List   Diagnosis Date Noted  . History of fusion of cervical spine 03/10/2018  . Neck pain 03/02/2018  . Age-related osteoporosis without current pathological fracture 01/01/2018  . Abnormal CXR 05/25/2017  . Hypertensive retinopathy 12/04/2015  . Chest pain with moderate risk for cardiac etiology 10/15/2015  .  Scoliosis deformity of spine 10/15/2015  . Hyperlipidemia 10/16/2013  . Essential hypertension 10/16/2013  . Mitral valve prolapse 10/16/2013   Past Medical History:  Diagnosis Date  . AC (acromioclavicular) joint bone spurs 2008   Back  . Anemia   . Arthritis   . Blood transfusion   . Bronchitis   . COPD (chronic obstructive pulmonary disease) ([place])   . GERD (gastroesophageal reflux disease)    uses tums  . Hepatitis    1970's  . Hyperlipidemia   . Hypertension   . Mitral valve prolapse 10/16/2013  . MVP (mitral valve prolapse)    with mild to mod MR and TR 04/2012; [facility])  . Osteoporosis   . Sleep apnea    no treatment at this time, had sleep study apprx 8 years ago   Family History  Problem Relation Age of Onset  . Alzheimer's disease Mother   . Alzheimer's disease Father    Past Surgical History:  Procedure Laterality Date  . ABDOMINAL HYSTERECTOMY  1964  . ANTERIOR CERVICAL DECOMP/DISCECTOMY FUSION    . [place]  . HERNIA REPAIR    . INGUINAL HERNIA REPAIR  06/16/2012   Procedure: HERNIA REPAIR INGUINAL ADULT;  Surgeon: Madilyn Hook, DO;  Location: [place];  Service: General;  Laterality: Right;  . KNEE ARTHROSCOPY  2007 & 2008  . NM MYOCAR PERF WALL MOTION  05/27/2011   normal  . SPINE SURGERY    . TONGUE BIOPSY  2000  . US ECHOCARDIOGRAPHY  06/07/2012   mild concentric LVH,LA mod. dilated,RA is mild to mod. dilated, mild MVP,prolapse of the posterior leaflet,mild mitral annular ca+,mild to mod MR & TR, AOV mildly sclerotic, aortic root sclerosis/ca+, insignificant pericardial effusion.  Marland Kitchen UVULECTOMY     Social History   Occupational History  . Not on file  Tobacco Use  . Smoking status: Never Smoker  . Smokeless tobacco: Never Used  Substance and Sexual Activity  . Alcohol use: Yes    Comment: occassional  . Drug use: No  . Sexual activity: Not on file

## 2018-12-31 ENCOUNTER — Telehealth (INDEPENDENT_AMBULATORY_CARE_PROVIDER_SITE_OTHER): Payer: Self-pay | Admitting: Physical Medicine and Rehabilitation

## 2018-12-31 DIAGNOSIS — M4126 Other idiopathic scoliosis, lumbar region: Secondary | ICD-10-CM

## 2018-12-31 DIAGNOSIS — M48062 Spinal stenosis, lumbar region with neurogenic claudication: Secondary | ICD-10-CM

## 2018-12-31 DIAGNOSIS — M5416 Radiculopathy, lumbar region: Secondary | ICD-10-CM

## 2018-12-31 NOTE — Telephone Encounter (Signed)
If walking has worsened significantly after injection will need updated MRI, she told me she had been having to use walker leading up to injection. Has not seen Dr. Lorin Mercy since October. Would be good to follow up with him concerning spine but injection was just done on Monday so still in window to help.

## 2018-12-31 NOTE — Telephone Encounter (Signed)
Noted. I discussed with Dr. Lorin Mercy.

## 2018-12-31 NOTE — Telephone Encounter (Signed)
FYI- MRI has been ordered, and I explained to the patient that they will not be calling her today to scheduled and that there is a process to getting this approved and scheduled. She states that she might call Dr. Lorin Mercy to see if he can get her in sooner for the MRI.

## 2019-01-05 ENCOUNTER — Encounter: Payer: Self-pay | Admitting: Adult Health

## 2019-01-05 ENCOUNTER — Ambulatory Visit: Payer: Medicare Other | Admitting: Adult Health

## 2019-01-05 VITALS — BP 112/62 | HR 111 | Ht 64.5 in | Wt 128.2 lb

## 2019-01-05 DIAGNOSIS — R000 Tachycardia, unspecified: Secondary | ICD-10-CM | POA: Diagnosis not present

## 2019-01-05 DIAGNOSIS — I341 Nonrheumatic mitral (valve) prolapse: Secondary | ICD-10-CM | POA: Diagnosis not present

## 2019-01-05 NOTE — Patient Instructions (Signed)
Follow-Up: You will need a follow up appointment in 12 months.  Please call our office 2 months in advance (December 2020) to schedule this (FEBURARY 2021) appointment.  You may see Sanda Klein, MD or one of the following Advanced Practice Providers on your designated Care Team:  Almyra Deforest, PA-C  Fabian Sharp, [place]      Medication Instructions:  NO CHANGES- Your physician recommends that you continue on your current medications as directed. Please refer to the Current Medication list given to you today. If you need a refill on your cardiac medications before your next appointment, please call your pharmacy. Labwork: When you have labs (blood work) and your tests are completely normal, you will receive your results ONLY by [facility] (if you have MyChart) -OR- A paper copy in the mail.  At [facility], you and your health needs are our priority.  As part of our continuing mission to provide you with exceptional heart care, we have created designated Provider Care Teams.  These Care Teams include your primary Cardiologist (physician) and Advanced Practice Providers (APPs -  Physician Assistants and Nurse Practitioners) who all work together to provide you with the care you need, when you need it.  Thank you for choosing [facility] at [facility]!!

## 2019-01-05 NOTE — Progress Notes (Signed)
Cardiology Office Note   Date:  01/05/2019   ID:  Caitlyn [name], DOB 07-10-1930, MRN [id number]  PCP:  Hulan Fess, MD  Cardiologist:  [place]  Chief Complaint  Patient presents with  . Palpitations     History of Present Illness: Caitlyn [name] is a 85 y.o. female who presents for ongoing assessment and management of hypertension, hyperlipidemia, mitral valve prolapse. Other history includes chronic back pain, related to scoliosis.   She has no known history of coronary disease with a normal nuclear stress test in 2012 and no history of previous cardiac catheterization. Beta blockers have generally worked well to control her palpitations. In the past she has had side effects with certain statins (simvastatin, Crestor) but she seems to tolerate atorvastatin  On last office visit she was doing well, was asymptomatic, and medically complaint.   She comes today with a lengthy list of complaints about her back. She has not complaints from a cardiac standpoint She denies DOE, chest pain, dizziness. She is limited on activities because of her scoliosis and hip pain. She receives epidural injections in her back and hips.  Past Medical History:  Diagnosis Date  . AC (acromioclavicular) joint bone spurs 2008   Back  . Anemia   . Arthritis   . Blood transfusion   . Bronchitis   . COPD (chronic obstructive pulmonary disease) ([place])   . GERD (gastroesophageal reflux disease)    uses tums  . Hepatitis    1970's  . Hyperlipidemia   . Hypertension   . Mitral valve prolapse 10/16/2013  . MVP (mitral valve prolapse)    with mild to mod MR and TR 04/2012; followed by [facility])  . Osteoporosis   . Sleep apnea    no treatment at this time, had sleep study apprx 8 years ago    Past Surgical History:  Procedure Laterality Date  . ABDOMINAL HYSTERECTOMY  1964  . ANTERIOR CERVICAL DECOMP/DISCECTOMY FUSION    . [place]  . HERNIA REPAIR    . INGUINAL HERNIA  REPAIR  06/16/2012   Procedure: HERNIA REPAIR INGUINAL ADULT;  Surgeon: Madilyn Hook, DO;  Location: [facility];  Service: General;  Laterality: Right;  . KNEE ARTHROSCOPY  2007 & 2008  . NM MYOCAR PERF WALL MOTION  05/27/2011   normal  . SPINE SURGERY    . TONGUE BIOPSY  2000  . US ECHOCARDIOGRAPHY  06/07/2012   mild concentric LVH,LA mod. dilated,RA is mild to mod. dilated, mild MVP,prolapse of the posterior leaflet,mild mitral annular ca+,mild to mod MR & TR, AOV mildly sclerotic, aortic root sclerosis/ca+, insignificant pericardial effusion.  Marland Kitchen UVULECTOMY       Current Outpatient Medications  Medication Sig Dispense Refill  . acetaminophen (TYLENOL) 325 MG tablet Take 650 mg by mouth every 6 (six) hours as needed.    Marland Kitchen antiseptic oral rinse (BIOTENE) LIQD 15 mLs by Mouth Rinse route as needed for dry mouth.    Marland Kitchen aspirin 81 MG tablet Take 81 mg by mouth daily.    Marland Kitchen atorvastatin (LIPITOR) 20 MG tablet TAKE ONE TABLET BY MOUTH DAILY 90 tablet 3  . brimonidine-timolol (COMBIGAN) 0.2-0.5 % ophthalmic solution Place 1 drop into the right eye every 12 (twelve) hours.    . Calcium Carbonate Antacid (TUMS PO) Take 1 tablet by mouth daily.    . Cholecalciferol (VITAMIN D3) 2000 UNITS TABS Take 1 capsule by mouth daily.     . Coenzyme Q10 (CO  Q 10) 100 MG CAPS Take 100 mg by mouth daily.    . cycloSPORINE (RESTASIS) 0.05 % ophthalmic emulsion Place 1 drop into both eyes 2 (two) times daily.     Marland Kitchen denosumab (PROLIA) 60 MG/ML SOSY injection Inject 60 mg into the skin every 6 (six) months.    . hydrochlorothiazide (MICROZIDE) 12.5 MG capsule Take 1 capsule (12.5 mg total) by mouth daily. 90 capsule 3  . Ibuprofen-diphenhydrAMINE Cit (ADVIL PM PO) Take 1 tablet by mouth at bedtime.    . Iron-Vitamins (GERITOL COMPLETE) TABS Take 1 tablet by mouth daily.    Marland Kitchen latanoprost (XALATAN) 0.005 % ophthalmic solution Place 1 drop into the right eye daily.    . meloxicam (MOBIC) 7.5 MG tablet Take 1 tablet (7.5 mg  total) by mouth daily. 30 tablet 0  . NASAL SALINE NA Place into the nose as needed.    Marland Kitchen OVER THE COUNTER MEDICATION Take 1 Dose by mouth daily. CBD OIL     No current facility-administered medications for this visit.     Allergies:   Fosamax [alendronate sodium]; Latex; and Statins    Social History:  The patient  reports that she has never smoked. She has never used smokeless tobacco. She reports current alcohol use. She reports that she does not use drugs.   Family History:  The patient's family history includes Alzheimer's disease in her father and mother.    ROS: All other systems are reviewed and negative. Unless otherwise mentioned in H&P    PHYSICAL EXAM: VS:  BP 112/62   Pulse (!) 111   Ht 5' 4.5" (1.638 m)   Wt 128 lb 3.2 oz (58.2 kg)   SpO2 95%   BMI 21.67 kg/m  , BMI Body mass index is 21.67 kg/m. GEN: Well nourished, well developed, in no acute distress HEENT: normal Neck: no JVD, carotid bruits, or masses Cardiac: RRR, tachycardic; no murmurs, rubs, or gallops,no edema  Respiratory:  Clear to auscultation bilaterally, normal work of breathing GI: soft, nontender, nondistended, + BS MS: no deformity or atrophy, kyphosis is noted.  Skin: warm and dry, no rash Neuro:  Strength and sensation are intact Psych: euthymic mood, full affect   EKG: Sinus tachycardia rate of 111 bpm. Mild LVH.   Recent Labs: No results found for requested labs within last 8760 hours.    Lipid Panel    Component Value Date/Time   CHOL 162 12/29/2017 1100   TRIG 60 12/29/2017 1100   HDL 95 12/29/2017 1100   CHOLHDL 1.7 12/29/2017 1100   CHOLHDL 1.8 11/27/2015 1125   VLDL 13 11/27/2015 1125   LDLCALC 55 12/29/2017 1100      Wt Readings from Last 3 Encounters:  01/05/19 128 lb 3.2 oz (58.2 kg)  08/10/18 129 lb 12.8 oz (58.9 kg)  03/08/18 140 lb (63.5 kg)      Other studies Reviewed: See scanned reports.    ASSESSMENT AND PLAN:  1. Sinus tachycardia: I am not  happy with her HR. She states that it is "always that way.". I have recommended low dose BB to assist with HR control, She refuses this. Prior note from Dr. Sallyanne Kuster stated that the BB had worked for her. With MVP it would be helpful for better HR control.   2. Scoliosis; She is having a lot of pain in her back and with any type of movement in her hips. She is receiving injections for pain. She believes some of her HR elevation is  due to her pain.   3. Mitral Valve Prolapse: She denies dyspnea or chest pain.    Current medicines are reviewed at length with the patient today.    Labs/ tests ordered today include: None  Phill Myron. [place] Pugh, ANP, AACC   01/05/2019 4:26 PM    [address] Office [phone] Fax [phone]

## 2019-01-06 NOTE — Progress Notes (Signed)
This sinus tachycardia has happened with her periodically. I have usually attributed it to pain. Has she had a Hgb and TSH checked recently? I don't see any in Epic, but maybe available in [place] from PCP? MCr

## 2019-01-07 ENCOUNTER — Other Ambulatory Visit: Payer: Self-pay

## 2019-01-07 DIAGNOSIS — Z79899 Other long term (current) drug therapy: Secondary | ICD-10-CM

## 2019-01-07 NOTE — Progress Notes (Signed)
Left detailed message to come in for labs and we will CB with results

## 2019-01-07 NOTE — Progress Notes (Signed)
I can order these tests to make sure they are up to date. I'll send a note to my nurse to order this and notify the patient we need a TSH and H&H for this information.

## 2019-01-07 NOTE — Progress Notes (Signed)
can order these tests to make sure they are up to date. I'll send a note to my nurse to order this and notify the patient we need a TSH and H&H for this information.   Left detailed message for pt to come in for labs at their convenience and we will CB with results.

## 2019-01-10 ENCOUNTER — Other Ambulatory Visit: Payer: Medicare Other

## 2019-01-11 ENCOUNTER — Ambulatory Visit
Admission: RE | Admit: 2019-01-11 | Discharge: 2019-01-11 | Disposition: A | Discharge status: Home / Self Care | Payer: Medicare Other | Source: Ambulatory Visit | Attending: Physical Medicine and Rehabilitation | Admitting: Physical Medicine and Rehabilitation

## 2019-01-11 DIAGNOSIS — M48062 Spinal stenosis, lumbar region with neurogenic claudication: Secondary | ICD-10-CM

## 2019-01-11 DIAGNOSIS — M4126 Other idiopathic scoliosis, lumbar region: Secondary | ICD-10-CM

## 2019-01-11 DIAGNOSIS — M5416 Radiculopathy, lumbar region: Secondary | ICD-10-CM

## 2019-01-12 ENCOUNTER — Inpatient Hospital Stay: Admission: RE | Admit: 2019-01-12 | Payer: Medicare Other | Source: Ambulatory Visit

## 2019-01-13 ENCOUNTER — Ambulatory Visit: Payer: Medicare Other | Admitting: Cardiovascular Disease

## 2019-01-18 ENCOUNTER — Ambulatory Visit (INDEPENDENT_AMBULATORY_CARE_PROVIDER_SITE_OTHER): Payer: Medicare Other | Admitting: Orthopaedic Surgery

## 2019-01-18 ENCOUNTER — Encounter (INDEPENDENT_AMBULATORY_CARE_PROVIDER_SITE_OTHER): Payer: Self-pay | Admitting: Orthopaedic Surgery

## 2019-01-18 VITALS — BP 143/86 | HR 114 | Ht 64.5 in | Wt 134.0 lb

## 2019-01-18 DIAGNOSIS — M810 Age-related osteoporosis without current pathological fracture: Secondary | ICD-10-CM | POA: Diagnosis not present

## 2019-01-18 DIAGNOSIS — M4126 Other idiopathic scoliosis, lumbar region: Secondary | ICD-10-CM

## 2019-01-18 DIAGNOSIS — M47816 Spondylosis without myelopathy or radiculopathy, lumbar region: Secondary | ICD-10-CM

## 2019-01-18 NOTE — Progress Notes (Signed)
Office Visit Note   Patient: Caitlyn [name]           Date of Birth: Jan 28, 1930           MRN: [id number] Visit Date: 01/18/2019              Requested by: Hulan Fess, MD [place], [place] [address] PCP: Hulan Fess, MD   Assessment & Plan: Visit Diagnoses:  1. Other idiopathic scoliosis, lumbar region   2. Age-related osteoporosis without current pathological fracture   3. Lumbar facet arthropathy     Plan: Conservative treatment recommended.  She can call in the summer if she like another injection with Dr. Ernestina Patches.  I gave her a copy of the MRI report we discussed that she has some progression at L4-5 level but correction of the scoliosis and multilevel foraminal stenosis would require multilevel instrumented fusion to correct her significant curve.  She like to avoid surgery if possible continue with walking program and with her osteoporosis treatment  Follow-Up Instructions: Return if symptoms worsen or fail to improve.   Orders:  No orders of the defined types were placed in this encounter.  No orders of the defined types were placed in this encounter.     Procedures: No procedures performed   Clinical Data: No additional findings.   Subjective: Chief Complaint  Patient presents with  . Lower Back - Follow-up    HPI 85 year old female returns post MRI lumbar spine for evaluation.  She is not had any epidurals recently she is on treatment for osteo-porosis with Prolia with Dr. Layne Benton.  Recently diagnosed with basal cell on her back and has upcoming surgery for this.  She primarily has back and leg pain worse on the left side recently.  She has significant scoliosis multilevel with mild progression at the L4-5 level no change at other levels.  Review of Systems 14 point review of systems updated unchanged from last office visit.   Objective: Vital Signs: BP (!) 143/86   Pulse (!) 114   Ht 5' 4.5" (1.638 m)   Wt 134 lb (60.8 kg)   BMI  22.65 kg/m   Physical Exam Constitutional:      Appearance: She is well-developed.  HENT:     Head: Normocephalic.     Right Ear: External ear normal.     Left Ear: External ear normal.  Eyes:     Pupils: Pupils are equal, round, and reactive to light.  Neck:     Thyroid: No thyromegaly.     Trachea: No tracheal deviation.  Cardiovascular:     Rate and Rhythm: Normal rate.  Pulmonary:     Effort: Pulmonary effort is normal.  Abdominal:     Palpations: Abdomen is soft.  Skin:    General: Skin is warm and dry.  Neurological:     Mental Status: She is alert and oriented to person, place, and time.  Psychiatric:        Behavior: Behavior normal.     Ortho Exam no pain with history of hip range of motion right or left.  Knees reach full extension.  She has trace edema both right and left lower extremity.  No venous stasis changes.  Some small superficial spider veins.  Distal pulses are intact negative straight leg raising 90 degrees.  Anterior tib gastrocsoleus is intact.  Mild pelvic obliquity lumbar scoliosis.  She is slow getting from sitting to standing but still is amatory without a cane.  Specialty Comments:  No specialty comments available.  Imaging: CLINICAL DATA:  Low back pain and left leg weakness.  EXAM: MRI LUMBAR SPINE WITHOUT CONTRAST  TECHNIQUE: Multiplanar, multisequence MR imaging of the lumbar spine was performed. No intravenous contrast was administered.  COMPARISON:  02/05/2015  FINDINGS: Segmentation: There are five lumbar type vertebral bodies. The last full intervertebral disc space is labeled L5-S1. This correlates with the prior MRI.  Alignment: Severe right convex lumbar scoliosis and associated severe degenerative lumbar spondylosis with multilevel disc disease and facet disease. Multilevel degenerative spondylolisthesis without obvious pars defects.  Vertebrae: Normal marrow signal. No bone lesions or fractures. Endplate reactive  changes are noted.  Conus medullaris and cauda equina: Conus extends to the L1 level. Conus and cauda equina appear normal.  Paraspinal and other soft tissues: No significant findings.  Disc levels:  T12-L1: No significant findings.  L1-2: Moderate degenerative disc disease with degenerated bulging annulus, osteophytic spurring and facet disease. There is moderate left and mild right lateral recess stenosis without significant spinal or foraminal stenosis. Possible impingement on the left L2 nerve root in the lateral recess.  L2-3: Advanced degenerate disc disease and facet disease. The facet disease is greater on the left and there is mild mass effect on the left side of the thecal sac and moderate left lateral recess stenosis. There is also left foraminal stenosis with potential irritation of both the left L2 and L3 nerve roots. No significant spinal stenosis and I do not see any significant change since the prior MRI.  L3-4: Advanced disc disease and facet disease. Bulging uncovered disc, osteophytic spurring, short pedicles and facet disease contributing to moderate to moderately severe spinal and bilateral lateral recess stenosis. No significant foraminal stenosis. No significant change when compared to prior study.  L4-5: Degenerative anterolisthesis of L4 with a bulging degenerated and uncovered disc. This in combination with severe facet disease contributes to moderately severe to severe spinal and bilateral lateral recess stenosis which appears somewhat progressive when compared to prior study. There is also mild bilateral foraminal stenosis which appears stable.  L5-S1: Moderate facet disease but no disc protrusions, spinal or foraminal stenosis.  IMPRESSION: 1. Severe scoliosis and degenerative lumbar spondylosis with multilevel disc disease and facet disease. This is relatively stable when compared to the prior MRI from 2016. 2. Multilevel  multifactorial spinal, lateral recess and foraminal stenosis as detailed above. The most significant findings are at L3-4 and L4-5. The L3-4 changes appears stable and the L4-5 changes appear progressive.   Electronically Signed   By: Marijo Sanes M.D.   On: 01/11/2019 10:53   PMFS History: Patient Active Problem List   Diagnosis Date Noted  . History of fusion of cervical spine 03/10/2018  . Neck pain 03/02/2018  . Age-related osteoporosis without current pathological fracture 01/01/2018  . Abnormal CXR 05/25/2017  . Hypertensive retinopathy 12/04/2015  . Chest pain with moderate risk for cardiac etiology 10/15/2015  . Scoliosis deformity of spine 10/15/2015  . Hyperlipidemia 10/16/2013  . Essential hypertension 10/16/2013  . Mitral valve prolapse 10/16/2013   Past Medical History:  Diagnosis Date  . AC (acromioclavicular) joint bone spurs 2008   Back  . Anemia   . Arthritis   . Blood transfusion   . Bronchitis   . COPD (chronic obstructive pulmonary disease) ([place])   . GERD (gastroesophageal reflux disease)    uses tums  . Hepatitis    1970's  . Hyperlipidemia   . Hypertension   . Mitral  valve prolapse 10/16/2013  . MVP (mitral valve prolapse)    with mild to mod MR and TR 04/2012; followed by Dr. Rollene Fare [facility])  . Osteoporosis   . Sleep apnea    no treatment at this time, had sleep study apprx 8 years ago    Family History  Problem Relation Age of Onset  . Alzheimer's disease Mother   . Alzheimer's disease Father     Past Surgical History:  Procedure Laterality Date  . ABDOMINAL HYSTERECTOMY  1964  . ANTERIOR CERVICAL DECOMP/DISCECTOMY FUSION    . [place]  . HERNIA REPAIR    . INGUINAL HERNIA REPAIR  06/16/2012   Procedure: HERNIA REPAIR INGUINAL ADULT;  Surgeon: Madilyn Hook, DO;  Location: [facility];  Service: General;  Laterality: Right;  . KNEE ARTHROSCOPY  2007 & 2008  . NM MYOCAR PERF WALL MOTION  05/27/2011   normal  . SPINE  SURGERY    . TONGUE BIOPSY  2000  . US ECHOCARDIOGRAPHY  06/07/2012   mild concentric LVH,LA mod. dilated,RA is mild to mod. dilated, mild MVP,prolapse of the posterior leaflet,mild mitral annular ca+,mild to mod MR & TR, AOV mildly sclerotic, aortic root sclerosis/ca+, insignificant pericardial effusion.  Marland Kitchen UVULECTOMY     Social History   Occupational History  . Not on file  Tobacco Use  . Smoking status: Never Smoker  . Smokeless tobacco: Never Used  Substance and Sexual Activity  . Alcohol use: Yes    Comment: occassional  . Drug use: No  . Sexual activity: Not on file

## 2019-01-20 ENCOUNTER — Other Ambulatory Visit: Payer: Self-pay

## 2019-01-20 DIAGNOSIS — Z79899 Other long term (current) drug therapy: Secondary | ICD-10-CM

## 2019-01-20 NOTE — Progress Notes (Signed)
No TSH in [facility]

## 2019-01-20 NOTE — Progress Notes (Signed)
CALLED PT BACK SHE STATES THAT SHE NEVER RECEIVED A MESSAGE AND SHE DOES NOT USE MYCHART ANY LONGER. SHE STATES THAT SHE IS IN [place] RIGHT NOW AND WILL NOT BE ABLE TO GET LAB DONE UNTIL 02-01-2019. INFORMED PT THAT THERE IS A [facility] IN [place] ADDRESS AND PHONE NUMBER GIVEN. PT STATES THAT SHE WOULD RATHER WAIT AND GO HERE BUT SHE WILL CALL AND SEE IF THEY WILL DO IT THERE. GAVE PT OUR NUMBERS AND TOLD HER TO HAVE THEM CALL us IF THEY CANNOT SEE THE ORDERS.

## 2019-01-21 ENCOUNTER — Telehealth: Payer: Self-pay | Admitting: Cardiovascular Disease

## 2019-01-21 NOTE — Telephone Encounter (Signed)
Patient called in regarding labs and if she needed or not. Patient stated she had labs within the last 30 days at her PCP Dr Lawernce Pitts and everything ok. Per office note [facility] 01/05/19 addendum patient needed thyroid and CBC. Patient is unsure what was checked at PCP. Requested labs be faxed.

## 2019-01-28 ENCOUNTER — Telehealth: Payer: Self-pay | Admitting: Cardiovascular Disease

## 2019-01-28 LAB — CBC
HEMOGLOBIN: 12.5 g/dL (ref 11.1–15.9)
Hematocrit: 36.8 % (ref 34.0–46.6)
MCH: 31.9 pg (ref 26.6–33.0)
MCHC: 34 g/dL (ref 31.5–35.7)
MCV: 94 fL (ref 79–97)
Platelets: 202 10*3/uL (ref 150–450)
RBC: 3.92 x10E6/uL (ref 3.77–5.28)
RDW: 13 % (ref 11.7–15.4)
WBC: 5.8 10*3/uL (ref 3.4–10.8)

## 2019-01-28 LAB — TSH: TSH: 2.79 u[IU]/mL (ref 0.450–4.500)

## 2019-01-28 NOTE — Telephone Encounter (Signed)
Pt aware of lab results ./cy 

## 2019-01-28 NOTE — Telephone Encounter (Signed)
New Message    Pt is calling to get lab results Please call back

## 2019-04-26 ENCOUNTER — Other Ambulatory Visit: Payer: Self-pay

## 2019-04-26 ENCOUNTER — Ambulatory Visit: Payer: Self-pay

## 2019-04-26 ENCOUNTER — Encounter: Payer: Self-pay | Admitting: Orthopaedic Surgery

## 2019-04-26 ENCOUNTER — Ambulatory Visit (INDEPENDENT_AMBULATORY_CARE_PROVIDER_SITE_OTHER): Payer: Medicare Other | Admitting: Orthopaedic Surgery

## 2019-04-26 VITALS — Ht 64.5 in | Wt 118.0 lb

## 2019-04-26 DIAGNOSIS — M25561 Pain in right knee: Secondary | ICD-10-CM | POA: Diagnosis not present

## 2019-04-26 DIAGNOSIS — M542 Cervicalgia: Secondary | ICD-10-CM

## 2019-04-26 DIAGNOSIS — Z981 Arthrodesis status: Secondary | ICD-10-CM

## 2019-04-26 DIAGNOSIS — M47812 Spondylosis without myelopathy or radiculopathy, cervical region: Secondary | ICD-10-CM

## 2019-04-27 ENCOUNTER — Telehealth: Payer: Self-pay | Admitting: Orthopaedic Surgery

## 2019-04-27 MED ORDER — TRAMADOL HCL 50 MG PO TABS: 50.0000 mg | ORAL_TABLET | Freq: Two times a day (BID) | ORAL | 0 refills | Status: DC | PRN

## 2019-04-27 NOTE — Telephone Encounter (Signed)
I called medication to pharmacy.  I called patient to advise.

## 2019-04-27 NOTE — Telephone Encounter (Signed)
Please advise on pain medication. 

## 2019-04-27 NOTE — Telephone Encounter (Signed)
Pt called in said she came in yesterday in regards to her neck and they talked about her seeing dr.newton and he doesn't have an opening until some time out. Pt said she cant wait that long that she needs something for the pain.  Please have something sent in to [employer] [facility]. [phone]

## 2019-04-27 NOTE — Telephone Encounter (Signed)
Send in ultram 50 mg one po bid prn pain # 30 thanks u call

## 2019-04-28 DIAGNOSIS — M47812 Spondylosis without myelopathy or radiculopathy, cervical region: Secondary | ICD-10-CM | POA: Insufficient documentation

## 2019-04-28 NOTE — Progress Notes (Signed)
Office Visit Note   Patient: Caitlyn [name]           Date of Birth: Feb 02, 1930           MRN: [id number] Visit Date: 04/26/2019              Requested by: Hulan Fess, MD [place],  [place] [address] PCP: Hulan Fess, MD   Assessment & Plan: Visit Diagnoses:  1. Neck pain   2. Acute pain of right knee   3. History of fusion of cervical spine   4. Cervical facet syndrome     Plan: We will set patient up for repeat left C2-3 and C3-4 facet injections which previously gave her great relief.  She can follow-up if she has persistent symptoms.  Follow-Up Instructions: Return if symptoms worsen or fail to improve.   Orders:  Orders Placed This Encounter  Procedures  . XR Cervical Spine 2 or 3 views  . XR Knee 1-2 Views Right  . Ambulatory referral to Physical Medicine Rehab   No orders of the defined types were placed in this encounter.     Procedures: No procedures performed   Clinical Data: No additional findings.   Subjective: Chief Complaint  Patient presents with  . Neck - Pain  . Right Knee - Pain    HPI 85 year old long-term patient seen with continued problems with some neck pain post epidural 06/21/2018.  Patient states she started having left-sided neck pain about a month ago got somewhat better started again yesterday radiating into her shoulders.  She states she is applied some horse liniment that helps.  She is also has had some problems with the right knee with pain for about 1 month.  She has increased pain which puts more weight on it.  She has problems getting from sitting to standing.  Neck seems to be giving her more problems than her knee at this point.  Review of Systems 14 point update unchanged from 01/18/2019 other than as mentioned in HPI.   Objective: Vital Signs: Ht 5' 4.5" (1.638 m)   Wt 118 lb (53.5 kg)   BMI 19.94 kg/m   Physical Exam Constitutional:      Appearance: She is well-developed.  HENT:     Head:  Normocephalic.     Right Ear: External ear normal.     Left Ear: External ear normal.  Eyes:     Pupils: Pupils are equal, round, and reactive to light.  Neck:     Thyroid: No thyromegaly.     Trachea: No tracheal deviation.  Cardiovascular:     Rate and Rhythm: Normal rate.  Pulmonary:     Effort: Pulmonary effort is normal.  Abdominal:     Palpations: Abdomen is soft.  Skin:    General: Skin is warm and dry.  Neurological:     Mental Status: She is alert and oriented to person, place, and time.  Psychiatric:        Behavior: Behavior normal.     Ortho Exam patient has only 30% rotation to the left with sharp pain.  Tenderness upper lateral left neck.  Upper extremity reflexes are 2+ and symmetrical.  Specialty Comments:  No specialty comments available.  Imaging: No results found.   PMFS History: Patient Active Problem List   Diagnosis Date Noted  . Cervical facet syndrome 04/28/2019  . History of fusion of cervical spine 03/10/2018  . Neck pain 03/02/2018  . Age-related osteoporosis without  current pathological fracture 01/01/2018  . Abnormal CXR 05/25/2017  . Hypertensive retinopathy 12/04/2015  . Chest pain with moderate risk for cardiac etiology 10/15/2015  . Scoliosis deformity of spine 10/15/2015  . Hyperlipidemia 10/16/2013  . Essential hypertension 10/16/2013  . Mitral valve prolapse 10/16/2013   Past Medical History:  Diagnosis Date  . AC (acromioclavicular) joint bone spurs 2008   Back  . Anemia   . Arthritis   . Blood transfusion   . Bronchitis   . COPD (chronic obstructive pulmonary disease) ([place])   . GERD (gastroesophageal reflux disease)    uses tums  . Hepatitis    1970's  . Hyperlipidemia   . Hypertension   . Mitral valve prolapse 10/16/2013  . MVP (mitral valve prolapse)    with mild to mod MR and TR 04/2012; followed by Dr. Rollene Fare [facility])  . Osteoporosis   . Sleep apnea    no treatment at this time, had sleep study apprx 8 years  ago    Family History  Problem Relation Age of Onset  . Alzheimer's disease Mother   . Alzheimer's disease Father     Past Surgical History:  Procedure Laterality Date  . ABDOMINAL HYSTERECTOMY  1964  . ANTERIOR CERVICAL DECOMP/DISCECTOMY FUSION    . [place]  . HERNIA REPAIR    . INGUINAL HERNIA REPAIR  06/16/2012   Procedure: HERNIA REPAIR INGUINAL ADULT;  Surgeon: Madilyn Hook, DO;  Location: [place];  Service: General;  Laterality: Right;  . KNEE ARTHROSCOPY  2007 & 2008  . NM MYOCAR PERF WALL MOTION  05/27/2011   normal  . SPINE SURGERY    . TONGUE BIOPSY  2000  . US ECHOCARDIOGRAPHY  06/07/2012   mild concentric LVH,LA mod. dilated,RA is mild to mod. dilated, mild MVP,prolapse of the posterior leaflet,mild mitral annular ca+,mild to mod MR & TR, AOV mildly sclerotic, aortic root sclerosis/ca+, insignificant pericardial effusion.  Marland Kitchen UVULECTOMY     Social History   Occupational History  . Not on file  Tobacco Use  . Smoking status: Never Smoker  . Smokeless tobacco: Never Used  Substance and Sexual Activity  . Alcohol use: Yes    Comment: occassional  . Drug use: No  . Sexual activity: Not on file

## 2019-05-03 ENCOUNTER — Telehealth: Payer: Self-pay | Admitting: Radiology

## 2019-05-03 NOTE — Telephone Encounter (Signed)
Patient brought in permanent handicap placard to be completed by Dr. Lorin Mercy and left addressed envelope to send it back in. Form completed and mailed.  Patient also wrote note to Dr. Lorin Mercy regarding Tramadol 50mg  and states that she took 2 times and was very sick. She is not taking that anymore.  Dr. Lorin Mercy aware.

## 2019-05-10 ENCOUNTER — Telehealth: Payer: Self-pay | Admitting: Orthopaedic Surgery

## 2019-05-10 NOTE — Telephone Encounter (Signed)
Pt called in left vm said she needs a call back from betsy or dr.yates about her shoulder/knee pain stated she wouldn't like to explain to anyone else because it was too complicated.   [phone]

## 2019-05-10 NOTE — Telephone Encounter (Signed)
I called patient. She states that she had to go to the beach because they are selling their beach house and her pain became unbearable. She is unable to walk on her own due to knee pain and her neck and shoulder is bothering her. She has appt for cervical facet injections with Dr. Ernestina Patches on 05/18/2019. I made appt with Dr. Lorin Mercy on 05/17/2019 for possible right knee injection. Patient is out of town and this is first available when she is back. She expressed understanding.

## 2019-05-17 ENCOUNTER — Other Ambulatory Visit: Payer: Self-pay

## 2019-05-17 ENCOUNTER — Encounter: Payer: Self-pay | Admitting: Orthopaedic Surgery

## 2019-05-17 ENCOUNTER — Ambulatory Visit (INDEPENDENT_AMBULATORY_CARE_PROVIDER_SITE_OTHER): Payer: Medicare Other | Admitting: Orthopaedic Surgery

## 2019-05-17 DIAGNOSIS — M1711 Unilateral primary osteoarthritis, right knee: Secondary | ICD-10-CM | POA: Diagnosis not present

## 2019-05-17 MED ORDER — BUPIVACAINE HCL 0.25 % IJ SOLN
4.0000 mL | INTRAMUSCULAR | Status: AC | PRN
  Administered 2019-05-17: 4 mL via INTRA_ARTICULAR

## 2019-05-17 MED ORDER — LIDOCAINE HCL 1 % IJ SOLN
0.5000 mL | INTRAMUSCULAR | Status: AC | PRN
  Administered 2019-05-17: 16:00:00 .5 mL

## 2019-05-17 MED ORDER — METHYLPREDNISOLONE ACETATE 40 MG/ML IJ SUSP
40.0000 mg | INTRAMUSCULAR | Status: AC | PRN
  Administered 2019-05-17: 40 mg via INTRA_ARTICULAR

## 2019-05-17 NOTE — Progress Notes (Signed)
Office Visit Note   Patient: Caitlyn [name]           Date of Birth: 27-Oct-1930           MRN: [id number] Visit Date: 05/17/2019              Requested by: Hulan Fess, MD [place],   [address] PCP: Hulan Fess, MD   Assessment & Plan: Visit Diagnoses:  1. Unilateral primary osteoarthritis, right knee     Plan: Right knee injection performed with some relief of her pain.  Should continue use a cane to decrease falling risk.  She has persistent problems she will let us know.  Follow-Up Instructions: No follow-ups on file.   Orders:  Orders Placed This Encounter  Procedures   Large Joint Inj   No orders of the defined types were placed in this encounter.     Procedures: Large Joint Inj: R knee on 05/17/2019 3:42 PM Indications: pain and joint swelling Details: 22 G 1.5 in needle, anterolateral approach  Arthrogram: No  Medications: 40 mg methylPREDNISolone acetate 40 MG/ML; 0.5 mL lidocaine 1 %; 4 mL bupivacaine 0.25 % Outcome: tolerated well, no immediate complications Procedure, treatment alternatives, risks and benefits explained, specific risks discussed. Consent was given by the patient. Immediately prior to procedure a time out was called to verify the correct patient, procedure, equipment, support staff and site/side marked as required. Patient was prepped and draped in the usual sterile fashion.       Clinical Data: No additional findings.   Subjective: Chief Complaint  Patient presents with   Right Knee - Pain    Patient wants an injection in her right knee    HPI 85 year old female seen with progressive right knee pain.  [place] had [place] called and requested some pain medication.  She states the Ultram made her sick she has been applying some horse liniment which have methyl salicylate and salicylic acid in it.  She states this is helped to some degree.  Previous x-ray showed moderate knee  arthritis primarily medial compartment with 2 mm space in her pain is on the medial side.  She has had some catching but has not fallen.  Pain bothers her at night she not able to sleep and she is requesting right knee injection today.  Review of Systems 14 point systems noncontributory as pertains to HPI.   Objective: Vital Signs: Ht 5' 4.5" (1.638 m)    Wt 118 lb (53.5 kg)    BMI 19.94 kg/m   Physical Exam Constitutional:      Appearance: She is well-developed.  HENT:     Head: Normocephalic.     Right Ear: External ear normal.     Left Ear: External ear normal.  Eyes:     Pupils: Pupils are equal, round, and reactive to light.  Neck:     Thyroid: No thyromegaly.     Trachea: No tracheal deviation.  Cardiovascular:     Rate and Rhythm: Normal rate.  Pulmonary:     Effort: Pulmonary effort is normal.  Abdominal:     Palpations: Abdomen is soft.  Skin:    General: Skin is warm and dry.  Neurological:     Mental Status: She is alert and oriented to person, place, and time.  Psychiatric:        Behavior: Behavior normal.     Ortho Exam minimal varus right knee.  Medial joint  line tenderness palpable medial osteophytes mild 2+ synovitis collateral ligaments are stable patellofemoral crepitus.  Distal pulses are intact negative popliteal compression test.  Some tenderness over the pes bursa but not as severe as the medial joint line.  Negative logroll to the hips.  Specialty Comments:  No specialty comments available.  Imaging: No results found.   PMFS History: Patient Active Problem List   Diagnosis Date Noted   Unilateral primary osteoarthritis, right knee 05/17/2019   Cervical facet syndrome 04/28/2019   History of fusion of cervical spine 03/10/2018   Neck pain 03/02/2018   Age-related osteoporosis without current pathological fracture 01/01/2018   Abnormal CXR 05/25/2017   Hypertensive retinopathy 12/04/2015   Chest pain with moderate risk for cardiac  etiology 10/15/2015   Scoliosis deformity of spine 10/15/2015   Hyperlipidemia 10/16/2013   Essential hypertension 10/16/2013   Mitral valve prolapse 10/16/2013   Past Medical History:  Diagnosis Date   AC (acromioclavicular) joint bone spurs 2008   Back   Anemia    Arthritis    Blood transfusion    Bronchitis    COPD (chronic obstructive pulmonary disease) ([facility])    GERD (gastroesophageal reflux disease)    uses tums   Hepatitis    1970's   Hyperlipidemia    Hypertension    Mitral valve prolapse 10/16/2013   MVP (mitral valve prolapse)    with mild to mod MR and TR 04/2012; followed by Dr. Rollene Fare [facility])   Osteoporosis    Sleep apnea    no treatment at this time, had sleep study apprx 8 years ago    Family History  Problem Relation Age of Onset   Alzheimer's disease Mother    Alzheimer's disease Father     Past Surgical History:  Procedure Laterality Date   ABDOMINAL HYSTERECTOMY  1964   ANTERIOR CERVICAL DECOMP/DISCECTOMY [place]  06/16/2012   Procedure: HERNIA REPAIR INGUINAL ADULT;  Surgeon: Madilyn Hook, DO;  Location: [place];  Service: General;  Laterality: Right;   KNEE ARTHROSCOPY  2007 & 2008   NM MYOCAR PERF WALL MOTION  05/27/2011   normal   SPINE SURGERY     TONGUE BIOPSY  2000   US ECHOCARDIOGRAPHY  06/07/2012   mild concentric LVH,LA mod. dilated,RA is mild to mod. dilated, mild MVP,prolapse of the posterior leaflet,mild mitral annular ca+,mild to mod MR & TR, AOV mildly sclerotic, aortic root sclerosis/ca+, insignificant pericardial effusion.   UVULECTOMY     Social History   Occupational History   Not on file  Tobacco Use   Smoking status: Never Smoker   Smokeless tobacco: Never Used  Substance and Sexual Activity   Alcohol use: Yes    Comment: occassional   Drug use: No   Sexual activity: Not on file

## 2019-05-18 ENCOUNTER — Ambulatory Visit (INDEPENDENT_AMBULATORY_CARE_PROVIDER_SITE_OTHER): Payer: Medicare Other | Admitting: Physical Medicine and Rehabilitation

## 2019-05-18 ENCOUNTER — Ambulatory Visit: Payer: Self-pay

## 2019-05-18 ENCOUNTER — Encounter

## 2019-05-18 ENCOUNTER — Encounter: Payer: Self-pay | Admitting: Physical Medicine and Rehabilitation

## 2019-05-18 VITALS — BP 157/81 | HR 81

## 2019-05-18 DIAGNOSIS — M545 Low back pain, unspecified: Secondary | ICD-10-CM

## 2019-05-18 DIAGNOSIS — M542 Cervicalgia: Secondary | ICD-10-CM | POA: Diagnosis not present

## 2019-05-18 DIAGNOSIS — M4126 Other idiopathic scoliosis, lumbar region: Secondary | ICD-10-CM

## 2019-05-18 DIAGNOSIS — M47812 Spondylosis without myelopathy or radiculopathy, cervical region: Secondary | ICD-10-CM

## 2019-05-18 DIAGNOSIS — M47816 Spondylosis without myelopathy or radiculopathy, lumbar region: Secondary | ICD-10-CM

## 2019-05-18 DIAGNOSIS — G8929 Other chronic pain: Secondary | ICD-10-CM

## 2019-05-18 MED ORDER — METHYLPREDNISOLONE ACETATE 80 MG/ML IJ SUSP: 80.0000 mg | Freq: Once | INTRAMUSCULAR | Status: DC

## 2019-05-18 MED ORDER — BUPIVACAINE HCL 0.5 % IJ SOLN: 3.0000 mL | Freq: Once | INTRAMUSCULAR | Status: DC

## 2019-05-18 NOTE — Progress Notes (Signed)
 .  Numeric Pain Rating Scale and Functional Assessment Average Pain 8   In the last MONTH (on 0-10 scale) has pain interfered with the following?  1. General activity like being  able to carry out your everyday physical activities such as walking, climbing stairs, carrying groceries, or moving a chair?  Rating(6)   +Driver, -BT, -Dye Allergies.  

## 2019-06-27 NOTE — Progress Notes (Signed)
Caitlyn [name] - 85 y.o. female MRN [id number]  Date of birth: [birth year]  Office Visit Note: Visit Date: 05/18/2019 PCP: Hulan Fess, MD Referred by: Hulan Fess, MD  Subjective: Chief Complaint  Patient presents with  . Neck - Pain  . Left Shoulder - Pain  . Lower Back - Pain   HPI: Caitlyn [name] is a 85 y.o. female who comes in today For planned urgent work in for left cervical facet injection.  Patient had called man with worsening severe neck pain on the left side.  She was in urgent need to try to get in for an injection which has helped her in the past.  She is followed by Dr. Lorin Mercy in the office.  We did try to work her in as quick as we could.  She comes in today stating the left-sided neck pain has actually subsided quite a bit.  She still has some neck pain with motion but it is manageable at this point.  She is also complaining now of right-sided low back pain without any referral pattern down the leg.  She has pain of the lower back with standing and ambulating.  She has a history of scoliosis and age-related osteoporosis without compression fracture.  She has a history of spondylosis with some help with injection and therapy.  She has not had recent physical therapy.  Last MRI of the lumbar spine was in 2016 as below.  She is not having focal weakness or bowel bladder difficulties or any other red flag complaints.  Review of Systems  Constitutional: Negative for chills, fever, malaise/fatigue and weight loss.  HENT: Negative for hearing loss and sinus pain.   Eyes: Negative for blurred vision, double vision and photophobia.  Respiratory: Negative for cough and shortness of breath.   Cardiovascular: Negative for chest pain, palpitations and leg swelling.  Gastrointestinal: Negative for abdominal pain, nausea and vomiting.  Genitourinary: Negative for flank pain.  Musculoskeletal: Positive for back pain, joint pain and neck pain. Negative for myalgias.  Skin: Negative for  itching and rash.  Neurological: Negative for tremors, focal weakness and weakness.  Endo/Heme/Allergies: Negative.   Psychiatric/Behavioral: Negative for depression.  All other systems reviewed and are negative.  Otherwise per HPI.  Assessment & Plan: Visit Diagnoses:  1. Cervical spondylosis without myelopathy   2. Cervicalgia   3. Other idiopathic scoliosis, lumbar region   4. Chronic right-sided low back pain without sciatica   5. Spondylosis without myelopathy or radiculopathy, lumbar region     Plan: Findings:  Chronic pain syndrome and chronic history of mostly axial neck pain axial low back pain with some level of scoliosis and age-related facet arthropathy.  No real radicular type pain or paresthesias.  No focal weakness.  Recent flareup of neck pain as not really resolved but it has reduced to the point where she does not really feel like an injection would be needed.  We did go over with her some reassurance with physical exam findings today which were benign as well as trying to encourage her to still continue with activities and strengthening.  We did talk about neck exercises and stretching as well as lower back stretching exercises.  We did spend a full 30 minutes discussing face-to-face about the current state of her cervical and lumbar spine pain and physical findings and exam findings and imaging.  We will see her back as needed she will continue follow-up with orthopedic surgeons as needed.    Meds &  Orders:  Meds ordered this encounter  Medications  . DISCONTD: bupivacaine (MARCAINE) 0.5 % (with pres) injection 3 mL  . DISCONTD: methylPREDNISolone acetate (DEPO-MEDROL) injection 80 mg   No orders of the defined types were placed in this encounter.   Follow-up: Return if symptoms worsen or fail to improve.   Procedures: No procedures performed  No notes on file   Clinical History: MRI LUMBAR SPINE WITHOUT AND WITH CONTRAST  TECHNIQUE: Multiplanar and multiecho  pulse sequences of the lumbar spine were obtained without and with intravenous contrast.  CONTRAST:  30mL MULTIHANCE GADOBENATE DIMEGLUMINE 529 MG/ML IV SOLN  COMPARISON:  CT abdomen and pelvis 10/19/2012.  FINDINGS: Segmentation: Normal.  Alignment: 5 mm anterolisthesis L4-5. 3 mm anterolisthesis L3-4. 2 mm retrolisthesis L2-3 and L1-2. Marked scoliosis convex RIGHT estimated 40 degrees.  Vertebrae: Endplate reactive changes above and below L1-2 and L2-3. Old compression fracture L1 with Schmorl's node involving the superior endplate.  Conus medullaris: Normal in size, signal, and location.  Paraspinal tissues: No evidence for hydronephrosis or paravertebral mass.  Disc levels:  L1-L2: 2 mm retrolisthesis is facet mediated. Mild to moderate disc space narrowing. Mild annular bulging. LEFT greater than RIGHT facet arthropathy. LEFT greater than RIGHT subarticular zone narrowing and foraminal zone narrowing.  L2-L3: 2 mm retrolisthesis. Mild to moderate disc space narrowing. Mild annular bulging mild annular bulging. LEFT greater than RIGHT facet arthropathy. LEFT greater than RIGHT subarticular zone narrowing foraminal zone narrowing. Mild central canal stenosis. LEFT L2 and LEFT L3 nerve root impingement.  L3-L4: Mild to moderate disc space narrowing. 3 mm anterolisthesis. Advanced facet and ligamentum flavum hypertrophy. Severe central canal stenosis. BILATERAL subarticular zone and foraminal zone narrowing affecting the L3 and L4 nerve roots.  L4-L5: Moderate to severe disc space narrowing. 5 mm anterolisthesis. Central protrusion. Moderate facet ligamentum flavum hypertrophy. Prior laminotomies. Severe central canal stenosis. BILATERAL L4 and L5 nerve root impingement.  L5-S1: Moderate disc space narrowing. Shallow central protrusion. BILATERAL facet arthropathy. RIGHT foraminal narrowing affects the L5 nerve root.  No worrisome abnormal  postcontrast enhancement. Moderate enhancement in the dorsal soft tissues related to facet disease and postsurgical change, is observed at L3-4 and L4-5.  IMPRESSION: Potentially symptomatic RIGHT-sided neural impingement at L5-S1 (foraminal), L3-4, and L4-5. See comments above.  40 degree scoliosis convex RIGHT mid lumbar region.  Asymmetric subarticular zone and foraminal zone narrowing affects the LEFT-sided nerve roots at L1-2 and L2-3.   Electronically Signed   By: Rolla Flatten M.D.   On: 02/05/2015 14:37   She reports that she has never smoked. She has never used smokeless tobacco. No results for input(s): HGBA1C, LABURIC in the last 8760 hours.  Objective:  VS:  HT:    WT:   BMI:     BP:(!) 157/81  HR:81bpm  TEMP: ( )  RESP:  Physical Exam Vitals signs and nursing note reviewed.  Constitutional:      General: She is not in acute distress.    Appearance: Normal appearance. She is well-developed.  HENT:     Head: Normocephalic and atraumatic.     Nose: Nose normal.     Mouth/Throat:     Mouth: Mucous membranes are moist.     Pharynx: Oropharynx is clear.  Eyes:     Conjunctiva/sclera: Conjunctivae normal.     Pupils: Pupils are equal, round, and reactive to light.  Neck:     Musculoskeletal: Normal range of motion and neck supple.  Cardiovascular:  Rate and Rhythm: Regular rhythm.  Pulmonary:     Effort: Pulmonary effort is normal. No respiratory distress.  Abdominal:     General: There is no distension.     Palpations: Abdomen is soft.     Tenderness: There is no guarding.  Musculoskeletal:     Right lower leg: No edema.     Left lower leg: No edema.     Comments: Examination of the cervical spine shows forward flex cervical spine with pain at end ranges of rotation more left than right.  She has negative Spurling's test bilaterally.  She has active trigger points in levator scapulae and trapezius.  She has some shoulder impingement right more than  left.  She has good upper extremity strength bilaterally without deficits.  Negative Hoffmann's test.  Examination lumbar spine shows pain with extension and facet loading.  There is obvious scoliosis deformity.  She has no pain with hip rotation has good distal strength without clonus.  Skin:    General: Skin is warm and dry.     Findings: No erythema or rash.  Neurological:     General: No focal deficit present.     Mental Status: She is alert and oriented to person, place, and time.     Motor: No abnormal muscle tone.     Coordination: Coordination normal.     Gait: Gait normal.  Psychiatric:        Mood and Affect: Mood normal.        Behavior: Behavior normal.        Thought Content: Thought content normal.     Ortho Exam Imaging: No results found.  Past Medical/Family/Surgical/Social History: Medications & Allergies reviewed per EMR, new medications updated. Patient Active Problem List   Diagnosis Date Noted  . Unilateral primary osteoarthritis, right knee 05/17/2019  . Cervical facet syndrome 04/28/2019  . History of fusion of cervical spine 03/10/2018  . Neck pain 03/02/2018  . Age-related osteoporosis without current pathological fracture 01/01/2018  . Abnormal CXR 05/25/2017  . Hypertensive retinopathy 12/04/2015  . Chest pain with moderate risk for cardiac etiology 10/15/2015  . Scoliosis deformity of spine 10/15/2015  . Hyperlipidemia 10/16/2013  . Essential hypertension 10/16/2013  . Mitral valve prolapse 10/16/2013   Past Medical History:  Diagnosis Date  . AC (acromioclavicular) joint bone spurs 2008   Back  . Anemia   . Arthritis   . Blood transfusion   . Bronchitis   . COPD (chronic obstructive pulmonary disease) ([place])   . GERD (gastroesophageal reflux disease)    uses tums  . Hepatitis    1970's  . Hyperlipidemia   . Hypertension   . Mitral valve prolapse 10/16/2013  . MVP (mitral valve prolapse)    with mild to mod MR and TR 04/2012; followed by  Dr. Rollene Fare [facility])  . Osteoporosis   . Sleep apnea    no treatment at this time, had sleep study apprx 8 years ago   Family History  Problem Relation Age of Onset  . Alzheimer's disease Mother   . Alzheimer's disease Father    Past Surgical History:  Procedure Laterality Date  . ABDOMINAL HYSTERECTOMY  1964  . ANTERIOR CERVICAL DECOMP/DISCECTOMY FUSION    . [place]  . HERNIA REPAIR    . INGUINAL HERNIA REPAIR  06/16/2012   Procedure: HERNIA REPAIR INGUINAL ADULT;  Surgeon: Madilyn Hook, DO;  Location: [facility];  Service: General;  Laterality: Right;  . KNEE ARTHROSCOPY  2007 & 2008  . NM MYOCAR PERF WALL MOTION  05/27/2011   normal  . SPINE SURGERY    . TONGUE BIOPSY  2000  . US ECHOCARDIOGRAPHY  06/07/2012   mild concentric LVH,LA mod. dilated,RA is mild to mod. dilated, mild MVP,prolapse of the posterior leaflet,mild mitral annular ca+,mild to mod MR & TR, AOV mildly sclerotic, aortic root sclerosis/ca+, insignificant pericardial effusion.  [facility] Kitchen UVULECTOMY     Social History   Occupational History  . Not on file  Tobacco Use  . Smoking status: Never Smoker  . Smokeless tobacco: Never Used  Substance and Sexual Activity  . Alcohol use: Yes    Comment: occassional  . Drug use: No  . Sexual activity: Not on file

## 2019-09-07 ENCOUNTER — Other Ambulatory Visit: Payer: Self-pay | Admitting: Cardiovascular Disease

## 2019-09-09 ENCOUNTER — Encounter (INDEPENDENT_AMBULATORY_CARE_PROVIDER_SITE_OTHER): Payer: Medicare Other | Admitting: Ophthalmology

## 2019-09-09 DIAGNOSIS — H35033 Hypertensive retinopathy, bilateral: Secondary | ICD-10-CM | POA: Diagnosis not present

## 2019-09-09 DIAGNOSIS — I10 Essential (primary) hypertension: Secondary | ICD-10-CM | POA: Diagnosis not present

## 2019-09-09 DIAGNOSIS — H35342 Macular cyst, hole, or pseudohole, left eye: Secondary | ICD-10-CM | POA: Diagnosis not present

## 2019-09-09 DIAGNOSIS — H43813 Vitreous degeneration, bilateral: Secondary | ICD-10-CM

## 2019-09-09 DIAGNOSIS — H348312 Tributary (branch) retinal vein occlusion, right eye, stable: Secondary | ICD-10-CM

## 2019-09-14 ENCOUNTER — Telehealth: Payer: Self-pay | Admitting: Physical Medicine and Rehabilitation

## 2019-09-14 NOTE — Telephone Encounter (Signed)
Scheduled for 11/23 at 1030.

## 2019-09-14 NOTE — Telephone Encounter (Signed)
Left l4 tf

## 2019-09-21 ENCOUNTER — Telehealth: Payer: Self-pay | Admitting: Orthopaedic Surgery

## 2019-09-21 NOTE — Telephone Encounter (Signed)
I called patient and advised we would cancel appt with Dr. Lorin Mercy since she is seeing Dr. Ernestina Patches for same thing. She will call back if she needs [place].

## 2019-09-21 NOTE — Telephone Encounter (Signed)
Patient has an appointment with Dr. Ernestina Patches on the 23rd of November, but has an appointment with Dr. Lorin Mercy on the 20th.  She is wanting to know if she needs to still come to the appointment with Dr. Lorin Mercy.  CB#[phone].  Thank you.

## 2019-09-22 ENCOUNTER — Other Ambulatory Visit: Payer: Self-pay | Admitting: Sports Medicine

## 2019-09-22 DIAGNOSIS — M810 Age-related osteoporosis without current pathological fracture: Secondary | ICD-10-CM

## 2019-09-30 ENCOUNTER — Ambulatory Visit: Payer: Medicare Other | Admitting: Orthopaedic Surgery

## 2019-10-03 ENCOUNTER — Ambulatory Visit: Payer: Medicare Other | Admitting: Physical Medicine and Rehabilitation

## 2019-10-03 ENCOUNTER — Other Ambulatory Visit: Payer: Self-pay

## 2019-10-03 ENCOUNTER — Ambulatory Visit: Payer: Self-pay

## 2019-10-03 ENCOUNTER — Encounter: Payer: Self-pay | Admitting: Physical Medicine and Rehabilitation

## 2019-10-03 ENCOUNTER — Encounter

## 2019-10-03 VITALS — BP 136/74 | HR 82

## 2019-10-03 DIAGNOSIS — M48062 Spinal stenosis, lumbar region with neurogenic claudication: Secondary | ICD-10-CM | POA: Diagnosis not present

## 2019-10-03 MED ORDER — BETAMETHASONE SOD PHOS & ACET 6 (3-3) MG/ML IJ SUSP
12.0000 mg | Freq: Once | INTRAMUSCULAR | Status: AC
  Administered 2019-10-03: 12:00:00 12 mg

## 2019-10-03 NOTE — Progress Notes (Signed)
 .  Numeric Pain Rating Scale and Functional Assessment Average Pain 8   In the last MONTH (on 0-10 scale) has pain interfered with the following?  1. General activity like being  able to carry out your everyday physical activities such as walking, climbing stairs, carrying groceries, or moving a chair?  Rating(8)   +Driver, -BT, -Dye Allergies.  

## 2019-10-03 NOTE — Progress Notes (Signed)
Caitlyn [name] - 85 y.o. female MRN [id number]  Date of birth: [birth year]  Office Visit Note: Visit Date: 10/03/2019 PCP: Hulan Fess, MD Referred by: Hulan Fess, MD  Subjective: Chief Complaint  Patient presents with  . Lower Back - Pain   HPI:  Caitlyn [name] is a 85 y.o. female who comes in today Planned L4 transforaminal injection for lumbar stenosis which is quite severe.  She is post decompression in the past at L5-S1.  She is followed by Dr. Rodell Perna.  She has had more recent findings of right low back pain some referral in the hip worse with standing and ambulating.  She reports problematic decrease overall in her height as she ages as well as some generalized weakness in the legs.  She has had no focal weakness or foot drop.  We have seen her in the past for epidural injection.  She has got relief with that.  She does have degenerative scoliosis and osteoporosis.  The other problems she is fighting is continued history of chronic severe constipation.  She is followed by [facility] gastroenterology.  ROS Otherwise per HPI.  Assessment & Plan: Visit Diagnoses:  1. Spinal stenosis of lumbar region with neurogenic claudication     Plan: No additional findings.   Meds & Orders:  Meds ordered this encounter  Medications  . betamethasone acetate-betamethasone sodium phosphate (CELESTONE) injection 12 mg    Orders Placed This Encounter  Procedures  . XR C-ARM NO REPORT  . Epidural Steroid injection    Follow-up: Return if symptoms worsen or fail to improve.   Procedures: No procedures performed  Lumbosacral Transforaminal Epidural Steroid Injection - Sub-Pedicular Approach with Fluoroscopic Guidance  Patient: Caitlyn [name]      Date of Birth: February 20, 1930 MRN: [id number] PCP: Hulan Fess, MD      Visit Date: 10/03/2019   Universal Protocol:    Date/Time: 10/03/2019  Consent Given By: the patient  Position: PRONE  Additional Comments: Vital signs were monitored  before and after the procedure. Patient was prepped and draped in the usual sterile fashion. The correct patient, procedure, and site was verified.   Injection Procedure Details:  Procedure Site One Meds Administered:  Meds ordered this encounter  Medications  . betamethasone acetate-betamethasone sodium phosphate (CELESTONE) injection 12 mg    Laterality: Right  Location/Site:  L4-L5  Needle size: 22 G  Needle type: Spinal  Needle Placement: Transforaminal  Findings:    -Comments: Excellent flow of contrast along the nerve and into the epidural space.  Procedure Details: After squaring off the end-plates to get a true AP view, the C-arm was positioned so that an oblique view of the foramen as noted above was visualized. The target area is just inferior to the "nose of the scotty dog" or sub pedicular. The soft tissues overlying this structure were infiltrated with 2-3 ml. of 1% Lidocaine without Epinephrine.  The spinal needle was inserted toward the target using a "trajectory" view along the fluoroscope beam.  Under AP and lateral visualization, the needle was advanced so it did not puncture dura and was located close the 6 O'Clock position of the pedical in AP tracterory. Biplanar projections were used to confirm position. Aspiration was confirmed to be negative for CSF and/or blood. A 1-2 ml. volume of Isovue-250 was injected and flow of contrast was noted at each level. Radiographs were obtained for documentation purposes.   After attaining the desired flow of contrast documented above, a 0.5 to  1.0 ml test dose of 0.25% Marcaine was injected into each respective transforaminal space.  The patient was observed for 90 seconds post injection.  After no sensory deficits were reported, and normal lower extremity motor function was noted,   the above injectate was administered so that equal amounts of the injectate were placed at each foramen (level) into the transforaminal epidural  space.   Additional Comments:  The patient tolerated the procedure well Dressing: 2 x 2 sterile gauze and Band-Aid    Post-procedure details: Patient was observed during the procedure. Post-procedure instructions were reviewed.  Patient left the clinic in stable condition.    Clinical History: MRI LUMBAR SPINE WITHOUT AND WITH CONTRAST  TECHNIQUE: Multiplanar and multiecho pulse sequences of the lumbar spine were obtained without and with intravenous contrast.  CONTRAST:  60mL MULTIHANCE GADOBENATE DIMEGLUMINE 529 MG/ML IV SOLN  COMPARISON:  CT abdomen and pelvis 10/19/2012.  FINDINGS: Segmentation: Normal.  Alignment: 5 mm anterolisthesis L4-5. 3 mm anterolisthesis L3-4. 2 mm retrolisthesis L2-3 and L1-2. Marked scoliosis convex RIGHT estimated 40 degrees.  Vertebrae: Endplate reactive changes above and below L1-2 and L2-3. Old compression fracture L1 with Schmorl's node involving the superior endplate.  Conus medullaris: Normal in size, signal, and location.  Paraspinal tissues: No evidence for hydronephrosis or paravertebral mass.  Disc levels:  L1-L2: 2 mm retrolisthesis is facet mediated. Mild to moderate disc space narrowing. Mild annular bulging. LEFT greater than RIGHT facet arthropathy. LEFT greater than RIGHT subarticular zone narrowing and foraminal zone narrowing.  L2-L3: 2 mm retrolisthesis. Mild to moderate disc space narrowing. Mild annular bulging mild annular bulging. LEFT greater than RIGHT facet arthropathy. LEFT greater than RIGHT subarticular zone narrowing foraminal zone narrowing. Mild central canal stenosis. LEFT L2 and LEFT L3 nerve root impingement.  L3-L4: Mild to moderate disc space narrowing. 3 mm anterolisthesis. Advanced facet and ligamentum flavum hypertrophy. Severe central canal stenosis. BILATERAL subarticular zone and foraminal zone narrowing affecting the L3 and L4 nerve roots.  L4-L5: Moderate to severe  disc space narrowing. 5 mm anterolisthesis. Central protrusion. Moderate facet ligamentum flavum hypertrophy. Prior laminotomies. Severe central canal stenosis. BILATERAL L4 and L5 nerve root impingement.  L5-S1: Moderate disc space narrowing. Shallow central protrusion. BILATERAL facet arthropathy. RIGHT foraminal narrowing affects the L5 nerve root.  No worrisome abnormal postcontrast enhancement. Moderate enhancement in the dorsal soft tissues related to facet disease and postsurgical change, is observed at L3-4 and L4-5.  IMPRESSION: Potentially symptomatic RIGHT-sided neural impingement at L5-S1 (foraminal), L3-4, and L4-5. See comments above.  40 degree scoliosis convex RIGHT mid lumbar region.  Asymmetric subarticular zone and foraminal zone narrowing affects the LEFT-sided nerve roots at L1-2 and L2-3.   Electronically Signed   By: Rolla Flatten M.D.   On: 02/05/2015 14:37     Objective:  VS:  HT:    WT:   BMI:     BP:136/74  HR:82bpm  TEMP: ( )  RESP:  Physical Exam Constitutional:      General: She is not in acute distress.    Appearance: Normal appearance. She is not ill-appearing.  HENT:     Head: Normocephalic and atraumatic.     Right Ear: External ear normal.     Left Ear: External ear normal.  Eyes:     Extraocular Movements: Extraocular movements intact.  Cardiovascular:     Rate and Rhythm: Normal rate.     Pulses: Normal pulses.  Musculoskeletal:     Right lower leg: No edema.  Left lower leg: No edema.     Comments: Patient has good distal strength with no pain over the greater trochanters.  No clonus or focal weakness.  She does ambulate with aid of a cane.  She is slow to rise from a seated position she does have obvious scoliotic deformity.  Skin:    Findings: No erythema, lesion or rash.  Neurological:     General: No focal deficit present.     Mental Status: She is alert and oriented to person, place, and time.     Sensory:  No sensory deficit.     Motor: No weakness or abnormal muscle tone.     Coordination: Coordination normal.  Psychiatric:        Mood and Affect: Mood normal.        Behavior: Behavior normal.     Ortho Exam Imaging: Xr C-arm No Report  Result Date: 10/03/2019 Please see Notes tab for imaging impression.

## 2019-10-03 NOTE — Procedures (Signed)
Lumbosacral Transforaminal Epidural Steroid Injection - Sub-Pedicular Approach with Fluoroscopic Guidance  Patient: Caitlyn [name]      Date of Birth: [birth year] MRN: [id number] PCP: Hulan Fess, MD      Visit Date: 10/03/2019   Universal Protocol:    Date/Time: 10/03/2019  Consent Given By: the patient  Position: PRONE  Additional Comments: Vital signs were monitored before and after the procedure. Patient was prepped and draped in the usual sterile fashion. The correct patient, procedure, and site was verified.   Injection Procedure Details:  Procedure Site One Meds Administered:  Meds ordered this encounter  Medications  . betamethasone acetate-betamethasone sodium phosphate (CELESTONE) injection 12 mg    Laterality: Right  Location/Site:  L4-L5  Needle size: 22 G  Needle type: Spinal  Needle Placement: Transforaminal  Findings:    -Comments: Excellent flow of contrast along the nerve and into the epidural space.  Procedure Details: After squaring off the end-plates to get a true AP view, the C-arm was positioned so that an oblique view of the foramen as noted above was visualized. The target area is just inferior to the "nose of the scotty dog" or sub pedicular. The soft tissues overlying this structure were infiltrated with 2-3 ml. of 1% Lidocaine without Epinephrine.  The spinal needle was inserted toward the target using a "trajectory" view along the fluoroscope beam.  Under AP and lateral visualization, the needle was advanced so it did not puncture dura and was located close the 6 O'Clock position of the pedical in AP tracterory. Biplanar projections were used to confirm position. Aspiration was confirmed to be negative for CSF and/or blood. A 1-2 ml. volume of Isovue-250 was injected and flow of contrast was noted at each level. Radiographs were obtained for documentation purposes.   After attaining the desired flow of contrast documented above, a 0.5 to 1.0  ml test dose of 0.25% Marcaine was injected into each respective transforaminal space.  The patient was observed for 90 seconds post injection.  After no sensory deficits were reported, and normal lower extremity motor function was noted,   the above injectate was administered so that equal amounts of the injectate were placed at each foramen (level) into the transforaminal epidural space.   Additional Comments:  The patient tolerated the procedure well Dressing: 2 x 2 sterile gauze and Band-Aid    Post-procedure details: Patient was observed during the procedure. Post-procedure instructions were reviewed.  Patient left the clinic in stable condition.

## 2019-10-10 ENCOUNTER — Ambulatory Visit
Admission: RE | Admit: 2019-10-10 | Discharge: 2019-10-10 | Disposition: A | Discharge status: Home / Self Care | Payer: Medicare Other | Source: Ambulatory Visit | Attending: Physician Assistant | Admitting: Physician Assistant

## 2019-10-10 ENCOUNTER — Other Ambulatory Visit: Payer: Self-pay | Admitting: Physician Assistant

## 2019-10-10 DIAGNOSIS — R1030 Lower abdominal pain, unspecified: Secondary | ICD-10-CM

## 2019-10-10 DIAGNOSIS — K5900 Constipation, unspecified: Secondary | ICD-10-CM

## 2019-10-10 MED ORDER — IOPAMIDOL (ISOVUE-300) INJECTION 61%
100.0000 mL | Freq: Once | INTRAVENOUS | Status: AC | PRN
  Administered 2019-10-10: 100 mL via INTRAVENOUS

## 2019-10-18 ENCOUNTER — Ambulatory Visit: Payer: Medicare Other | Admitting: Cardiovascular Disease

## 2019-10-18 ENCOUNTER — Other Ambulatory Visit: Payer: Self-pay

## 2019-10-18 VITALS — BP 138/62 | HR 89 | Ht 63.0 in | Wt 124.0 lb

## 2019-10-18 DIAGNOSIS — E7800 Pure hypercholesterolemia, unspecified: Secondary | ICD-10-CM

## 2019-10-18 DIAGNOSIS — I10 Essential (primary) hypertension: Secondary | ICD-10-CM

## 2019-10-18 DIAGNOSIS — I341 Nonrheumatic mitral (valve) prolapse: Secondary | ICD-10-CM | POA: Diagnosis not present

## 2019-10-18 NOTE — Patient Instructions (Signed)

## 2019-10-18 NOTE — Progress Notes (Signed)
Patient ID: Caitlyn [name], female   DOB: 04-06-1930, 85 y.o.   MRN: [id number]    Cardiology Office Note    Date:  10/22/2019   ID:  Caitlyn [name], DOB 1930-10-24, MRN [id number]  PCP:  Hulan Fess, MD  Cardiologist:   Sanda Klein, MD   Chief Complaint  Patient presents with  . Back Pain    History of Present Illness:  Caitlyn [name] is a 85 y.o. female with long-standing systemic hypertension and hyperlipidemia and mild mitral valve prolapse returning for follow-up.   She does not have any cardiovascular complaints.  Her biggest problem is severe constipation for which she has tried numerous medications.  She has hemorrhoids.  She did not have any relief with Amitiza or Linzess or a variety of laxatives.  She has worsening back pain from severe dextroscoliosis of her thoracic spine, but she does not take narcotic analgesics other than occasional tramadol.  She's lost a lot of weight, about 40 pounds by her report.  The patient specifically denies any chest pain at rest exertion, dyspnea at rest or with exertion, orthopnea, paroxysmal nocturnal dyspnea, syncope, palpitations, focal neurological deficits, intermittent claudication, lower extremity edema, unexplained weight gain, cough, hemoptysis or wheezing.  Her blood pressure is well controlled.  She continues to take atorvastatin and her most recent LDL cholesterol was 67 with an excellent HDL of 84.  She has no known history of coronary disease with a normal nuclear stress test in 2012 and no history of previous cardiac catheterization.In the past she has had side effects with certain statins (simvastatin, Crestor) but she seems to tolerate atorvastatin reasonably well.   Past Medical History:  Diagnosis Date  . AC (acromioclavicular) joint bone spurs 2008   Back  . Anemia   . Arthritis   . Blood transfusion   . Bronchitis   . COPD (chronic obstructive pulmonary disease) ([place])   . GERD (gastroesophageal reflux disease)     uses tums  . Hepatitis    1970's  . Hyperlipidemia   . Hypertension   . Mitral valve prolapse 10/16/2013  . MVP (mitral valve prolapse)    with mild to mod MR and TR 04/2012; followed by Dr. Rollene Fare [facility])  . Osteoporosis   . Sleep apnea    no treatment at this time, had sleep study apprx 8 years ago    Past Surgical History:  Procedure Laterality Date  . ABDOMINAL HYSTERECTOMY  1964  . ANTERIOR CERVICAL DECOMP/DISCECTOMY FUSION    . [place]  . HERNIA REPAIR    . INGUINAL HERNIA REPAIR  06/16/2012   Procedure: HERNIA REPAIR INGUINAL ADULT;  Surgeon: Madilyn Hook, DO;  Location: [place];  Service: General;  Laterality: Right;  . KNEE ARTHROSCOPY  2007 & 2008  . NM MYOCAR PERF WALL MOTION  05/27/2011   normal  . SPINE SURGERY    . TONGUE BIOPSY  2000  . US ECHOCARDIOGRAPHY  06/07/2012   mild concentric LVH,LA mod. dilated,RA is mild to mod. dilated, mild MVP,prolapse of the posterior leaflet,mild mitral annular ca+,mild to mod MR & TR, AOV mildly sclerotic, aortic root sclerosis/ca+, insignificant pericardial effusion.  Marland Kitchen UVULECTOMY      Outpatient Medications Prior to Visit  Medication Sig Dispense Refill  . acetaminophen (TYLENOL) 325 MG tablet Take 650 mg by mouth every 6 (six) hours as needed.    Marland Kitchen antiseptic oral rinse (BIOTENE) LIQD 15 mLs by Mouth Rinse route as needed for dry  mouth.    . aspirin 81 MG tablet Take 81 mg by mouth daily.    Marland Kitchen atorvastatin (LIPITOR) 20 MG tablet TAKE ONE TABLET BY MOUTH DAILY 90 tablet 3  . brimonidine-timolol (COMBIGAN) 0.2-0.5 % ophthalmic solution Place 1 drop into the right eye every 12 (twelve) hours.    . Calcium Carbonate Antacid (TUMS PO) Take 1 tablet by mouth daily.    . Cholecalciferol (VITAMIN D3) 2000 UNITS TABS Take 1 capsule by mouth daily.     . Coenzyme Q10 (CO Q 10) 100 MG CAPS Take 100 mg by mouth daily.    . cycloSPORINE (RESTASIS) 0.05 % ophthalmic emulsion Place 1 drop into both eyes 2 (two) times  daily.     Marland Kitchen denosumab (PROLIA) 60 MG/ML SOSY injection Inject 60 mg into the skin every 6 (six) months.    . hydrochlorothiazide (MICROZIDE) 12.5 MG capsule TAKE ONE CAPSULE BY MOUTH DAILY 90 capsule 0  . Ibuprofen-diphenhydrAMINE Cit (ADVIL PM PO) Take 1 tablet by mouth at bedtime.    . Iron-Vitamins (GERITOL COMPLETE) TABS Take 1 tablet by mouth daily.    Marland Kitchen latanoprost (XALATAN) 0.005 % ophthalmic solution Place 1 drop into the right eye daily.    . meloxicam (MOBIC) 7.5 MG tablet Take 1 tablet (7.5 mg total) by mouth daily. 30 tablet 0  . NASAL SALINE NA Place into the nose as needed.    Marland Kitchen OVER THE COUNTER MEDICATION Take 1 Dose by mouth daily. CBD OIL    . traMADol (ULTRAM) 50 MG tablet Take 1 tablet (50 mg total) by mouth 2 (two) times daily as needed. 30 tablet 0   No facility-administered medications prior to visit.     Allergies:   Fosamax [alendronate sodium], Latex, and Statins   Social History   Socioeconomic History  . Marital status: Married    Spouse name: Not on file  . Number of children: Not on file  . Years of education: Not on file  . Highest education level: Not on file  Occupational History  . Not on file  Tobacco Use  . Smoking status: Never Smoker  . Smokeless tobacco: Never Used  Substance and Sexual Activity  . Alcohol use: Yes    Comment: occassional  . Drug use: No  . Sexual activity: Not on file  Other Topics Concern  . Not on file  Social History Narrative  . Not on file   Social Determinants of Health   Financial Resource Strain:   . Difficulty of Paying Living Expenses: Not on file  Food Insecurity:   . Worried About Charity fundraiser in the Last Year: Not on file  . Ran Out of Food in the Last Year: Not on file  Transportation Needs:   . Lack of Transportation (Medical): Not on file  . Lack of Transportation (Non-Medical): Not on file  Physical Activity:   . Days of Exercise per Week: Not on file  . Minutes of Exercise per Session:  Not on file  Stress:   . Feeling of Stress : Not on file  Social Connections:   . Frequency of Communication with Friends and Family: Not on file  . Frequency of Social Gatherings with Friends and Family: Not on file  . Attends Religious Services: Not on file  . Active Member of Clubs or Organizations: Not on file  . Attends Archivist Meetings: Not on file  . Marital Status: Not on file     Family History:  The patient's   family history includes Alzheimer's disease in her father and mother.   ROS:   Please see the history of present illness.    ROS All other systems are reviewed and are negative.   PHYSICAL EXAM:   VS:  BP 138/62   Pulse 89   Ht 5\' 3"  (1.6 m)   Wt 124 lb (56.2 kg)   SpO2 97%   BMI 21.97 kg/m    Prominent thoracic dextroscoliosis   General: Alert, oriented x3, no distress, she is quite lean Head: no evidence of trauma, PERRL, EOMI, no exophtalmos or lid lag, no myxedema, no xanthelasma; normal ears, nose and oropharynx Neck: normal jugular venous pulsations and no hepatojugular reflux; brisk carotid pulses without delay and no carotid bruits Chest: clear to auscultation, no signs of consolidation by percussion or palpation, normal fremitus, symmetrical and full respiratory excursions Cardiovascular: normal position and quality of the apical impulse, regular rhythm, normal first and second heart sounds, no murmurs, rubs or gallops Abdomen: no tenderness or distention, no masses by palpation, no abnormal pulsatility or arterial bruits, normal bowel sounds, no hepatosplenomegaly Extremities: no clubbing, cyanosis or edema; 2+ radial, ulnar and brachial pulses bilaterally; 2+ right femoral, posterior tibial and dorsalis pedis pulses; 2+ left femoral, posterior tibial and dorsalis pedis pulses; no subclavian or femoral bruits Neurological: grossly nonfocal Psych: Normal mood and affect   Wt Readings from Last 3 Encounters:  10/18/19 124 lb (56.2 kg)   05/17/19 118 lb (53.5 kg)  04/26/19 118 lb (53.5 kg)      Studies/Labs Reviewed:   EKG:  EKG is ordered today.  It shows normal sinus rhythm with a QS pattern in leads V1-V2, but otherwise a normal tracing, QTC 428 ms Recent Labs: 01/27/2019: Hemoglobin 12.5; Platelets 202; TSH 2.790   Lipid Panel    Component Value Date/Time   CHOL 162 12/29/2017 1100   TRIG 60 12/29/2017 1100   HDL 95 12/29/2017 1100   CHOLHDL 1.7 12/29/2017 1100   CHOLHDL 1.8 11/27/2015 1125   VLDL 13 11/27/2015 1125   LDLCALC 55 12/29/2017 1100   09/12/2019 Total cholesterol 163, HDL 84, LDL 67, triglycerides 61 Hemoglobin 12.8, creatinine 0.67, potassium 4.4, TSH 2.51  ASSESSMENT:    1. Essential hypertension   2. Hypercholesterolemia   3. Mitral valve prolapse      PLAN:  In order of problems listed above:  1. HTN: Well-controlled, occasionally high when she is in discomfort from her back or her abdomen. 2. HLP: Excellent cholesterol profile. 3. MVP: Asymptomatic.  Even the palpitations do not bother her anymore.  Had mild posterior leaflet prolapse with mild-moderate insufficiency. Dilated left atrium, normal left ventricular size and function by echo.  We discussed the fact that the current guidelines no longer recommend antibiotic prophylaxis, but she is very habituated with the use of amoxicillin before dental procedures and does not want to change.    Medication Adjustments/Labs and Tests Ordered: Current medicines are reviewed at length with the patient today.  Concerns regarding medicines are outlined above.  Medication changes, Labs and Tests ordered today are listed in the Patient Instructions below. Patient Instructions  Medication Instructions:  No changes *If you need a refill on your cardiac medications before your next appointment, please call your pharmacy*  Lab Work: None ordered If you have labs (blood work) drawn today and your tests are completely normal, you will receive  your results only by: Marland Kitchen MyChart Message (if you have MyChart) OR .  A paper copy in the mail If you have any lab test that is abnormal or we need to change your treatment, we will call you to review the results.  Testing/Procedures: None ordered  Follow-Up: At [facility], you and your health needs are our priority.  As part of our continuing mission to provide you with exceptional heart care, we have created designated Provider Care Teams.  These Care Teams include your primary Cardiologist (physician) and Advanced Practice Providers (APPs -  Physician Assistants and Nurse Practitioners) who all work together to provide you with the care you need, when you need it.  Your next appointment:   12 month(s)  The format for your next appointment:   In Person  Provider:   Sanda Klein, MD        Signed, Sanda Klein, MD  10/22/2019 8:42 PM    [address], [place], [place]  [address] Phone: [phone]; Fax: [phone]

## 2019-10-22 ENCOUNTER — Encounter: Payer: Self-pay | Admitting: Cardiovascular Disease

## 2019-11-07 ENCOUNTER — Other Ambulatory Visit: Payer: Self-pay

## 2019-11-07 ENCOUNTER — Ambulatory Visit
Admission: RE | Admit: 2019-11-07 | Discharge: 2019-11-07 | Disposition: A | Discharge status: Home / Self Care | Payer: Medicare Other | Source: Ambulatory Visit | Attending: Sports Medicine | Admitting: Sports Medicine

## 2019-11-07 DIAGNOSIS — M810 Age-related osteoporosis without current pathological fracture: Secondary | ICD-10-CM

## 2019-11-10 ENCOUNTER — Other Ambulatory Visit: Payer: Medicare Other

## 2019-11-30 ENCOUNTER — Ambulatory Visit: Payer: Medicare Other | Attending: Internal Medicine

## 2019-11-30 ENCOUNTER — Other Ambulatory Visit: Payer: Self-pay | Admitting: Cardiovascular Disease

## 2019-11-30 DIAGNOSIS — Z23 Encounter for immunization: Secondary | ICD-10-CM | POA: Insufficient documentation

## 2019-11-30 NOTE — Progress Notes (Signed)
   Covid-19 Vaccination Clinic  Name:  Caitlyn [name]    MRN: [id number] DOB: 08-05-1930  11/30/2019  [name] was observed post Covid-19 immunization for 15 minutes without incidence. She was provided with Vaccine Information Sheet and instruction to access the V-Safe system.   [name] was instructed to call 911 with any severe reactions post vaccine: Marland Kitchen Difficulty breathing  . Swelling of your face and throat  . A fast heartbeat  . A bad rash all over your body  . Dizziness and weakness    Immunizations Administered    Name Date Dose VIS Date Route   Pfizer COVID-19 Vaccine 11/30/2019  8:53 AM 0.3 mL 10/21/2019 Intramuscular   Manufacturer: [place]   Lot: [id number]   [place]: [id number]

## 2019-12-11 ENCOUNTER — Other Ambulatory Visit: Payer: Self-pay | Admitting: Cardiovascular Disease

## 2019-12-19 ENCOUNTER — Ambulatory Visit: Payer: Medicare Other | Attending: Internal Medicine

## 2019-12-19 DIAGNOSIS — Z23 Encounter for immunization: Secondary | ICD-10-CM

## 2019-12-19 NOTE — Progress Notes (Signed)
   Covid-19 Vaccination Clinic  Name:  Caitlyn [name]    MRN: [id number] DOB: October 30, 1930  12/19/2019  Caitlyn [name] was observed post Covid-19 immunization for 15 minutes without incidence. She was provided with Vaccine Information Sheet and instruction to access the V-Safe system.   Caitlyn [name] was instructed to call 911 with any severe reactions post vaccine: Marland Kitchen Difficulty breathing  . Swelling of your face and throat  . A fast heartbeat  . A bad rash all over your body  . Dizziness and weakness    Immunizations Administered    Name Date Dose VIS Date Route   Pfizer COVID-19 Vaccine 12/19/2019  1:36 PM 0.3 mL 10/21/2019 Intramuscular   Manufacturer: [place]   Lot: [id number]   [place]: [id number]

## 2019-12-30 ENCOUNTER — Other Ambulatory Visit: Payer: Self-pay | Admitting: Physician Assistant

## 2019-12-30 ENCOUNTER — Ambulatory Visit
Admission: RE | Admit: 2019-12-30 | Discharge: 2019-12-30 | Disposition: A | Discharge status: Home / Self Care | Payer: Medicare Other | Source: Ambulatory Visit | Attending: Physician Assistant | Admitting: Physician Assistant

## 2019-12-30 DIAGNOSIS — R197 Diarrhea, unspecified: Secondary | ICD-10-CM

## 2020-01-03 ENCOUNTER — Other Ambulatory Visit: Payer: Self-pay

## 2020-01-03 ENCOUNTER — Ambulatory Visit: Payer: Medicare Other | Admitting: Orthopaedic Surgery

## 2020-01-03 ENCOUNTER — Encounter: Payer: Self-pay | Admitting: Orthopaedic Surgery

## 2020-01-03 VITALS — BP 138/83 | HR 76 | Ht 64.5 in | Wt 114.0 lb

## 2020-01-03 DIAGNOSIS — M4126 Other idiopathic scoliosis, lumbar region: Secondary | ICD-10-CM | POA: Diagnosis not present

## 2020-01-03 NOTE — Progress Notes (Signed)
Office Visit Note   Patient: Caitlyn [name]           Date of Birth: 1930/01/13           MRN: [id number] Visit Date: 01/03/2020              Requested by: Hulan Fess, MD [place],  [place] [address] PCP: Hulan Fess, MD   Assessment & Plan: Visit Diagnoses:  1. Other idiopathic scoliosis, lumbar region     Plan: Patient given some low she can check when she gets home for possible impaction.  She states she wants to talk with her gastroenterologist about possible colonoscopy.  X-ray showed significant stool in the distal colon that was done 12/30/2019.  She does have significant scoliosis but understands that correction of the curvature and foraminal stenosis would require multilevel instrumented fusion.   Follow-Up Instructions: No follow-ups on file.   Orders:  No orders of the defined types were placed in this encounter.  No orders of the defined types were placed in this encounter.     Procedures: No procedures performed   Clinical Data: No additional findings.   Subjective: Chief Complaint  Patient presents with  . Neck - Pain  . Scoliosis    HPI 86 year old female returns with abdominal pain most the time right lower quadrant.  She has had problems with chronic constipation 2 episodes where she had impaction that had to be disimpacted.  She has had CT scans in the past.  She does have significant scoliosis and has had MRI that showed multilevel foraminal narrowing from her scoliosis.  We had discussed correction of multilevel scoliosis would be a significant undertaking she gets relief when she lays down for period of time.  Again today she is asking about whether a brace would straighten her up.  Previous cervical fusion doing well.  Patient states that milk of magnesia seems to work best she has tried every other oral over-the-counter medications that would help with constipation.  Review of Systems positive abdominal pain neuro  problems with impactions in the past intermittent diarrhea.   Objective: Vital Signs: BP 138/83   Pulse 76   Ht 5' 4.5" (1.638 m)   Wt 114 lb (51.7 kg)   BMI 19.27 kg/m   Physical Exam Constitutional:      Appearance: She is well-developed.  HENT:     Head: Normocephalic.     Right Ear: External ear normal.     Left Ear: External ear normal.  Eyes:     Pupils: Pupils are equal, round, and reactive to light.  Neck:     Thyroid: No thyromegaly.     Trachea: No tracheal deviation.  Cardiovascular:     Rate and Rhythm: Normal rate.  Pulmonary:     Effort: Pulmonary effort is normal.  Abdominal:     Palpations: Abdomen is soft.  Skin:    General: Skin is warm and dry.  Neurological:     Mental Status: She is alert and oriented to person, place, and time.  Psychiatric:        Behavior: Behavior normal.     Ortho Exam patient scoliosis hip range of motion is normal.  No lower extremity edema.  No lower extremity weakness.  She does have some crepitus with knee extension more on the right than left. Specialty Comments:  No specialty comments available.  Imaging: No results found.   PMFS History: Patient Active Problem List   Diagnosis  Date Noted  . Unilateral primary osteoarthritis, right knee 05/17/2019  . Cervical facet syndrome 04/28/2019  . History of fusion of cervical spine 03/10/2018  . Neck pain 03/02/2018  . Age-related osteoporosis without current pathological fracture 01/01/2018  . Abnormal CXR 05/25/2017  . Hypertensive retinopathy 12/04/2015  . Chest pain with moderate risk for cardiac etiology 10/15/2015  . Scoliosis deformity of spine 10/15/2015  . Hyperlipidemia 10/16/2013  . Essential hypertension 10/16/2013  . Mitral valve prolapse 10/16/2013   Past Medical History:  Diagnosis Date  . AC (acromioclavicular) joint bone spurs 2008   Back  . Anemia   . Arthritis   . Blood transfusion   . Bronchitis   . COPD (chronic obstructive pulmonary  disease) ([place])   . GERD (gastroesophageal reflux disease)    uses tums  . Hepatitis    1970's  . Hyperlipidemia   . Hypertension   . Mitral valve prolapse 10/16/2013  . MVP (mitral valve prolapse)    with mild to mod MR and TR 04/2012; followed by Dr. Rollene Fare [facility])  . Osteoporosis   . Sleep apnea    no treatment at this time, had sleep study apprx 8 years ago    Family History  Problem Relation Age of Onset  . Alzheimer's disease Mother   . Alzheimer's disease Father     Past Surgical History:  Procedure Laterality Date  . ABDOMINAL HYSTERECTOMY  1964  . ANTERIOR CERVICAL DECOMP/DISCECTOMY FUSION    . [place]  . HERNIA REPAIR    . INGUINAL HERNIA REPAIR  06/16/2012   Procedure: HERNIA REPAIR INGUINAL ADULT;  Surgeon: Madilyn Hook, DO;  Location: [place];  Service: General;  Laterality: Right;  . KNEE ARTHROSCOPY  2007 & 2008  . NM MYOCAR PERF WALL MOTION  05/27/2011   normal  . SPINE SURGERY    . TONGUE BIOPSY  2000  . US ECHOCARDIOGRAPHY  06/07/2012   mild concentric LVH,LA mod. dilated,RA is mild to mod. dilated, mild MVP,prolapse of the posterior leaflet,mild mitral annular ca+,mild to mod MR & TR, AOV mildly sclerotic, aortic root sclerosis/ca+, insignificant pericardial effusion.  Marland Kitchen UVULECTOMY     Social History   Occupational History  . Not on file  Tobacco Use  . Smoking status: Never Smoker  . Smokeless tobacco: Never Used  Substance and Sexual Activity  . Alcohol use: Yes    Comment: occassional  . Drug use: No  . Sexual activity: Not on file

## 2020-01-06 ENCOUNTER — Other Ambulatory Visit: Payer: Self-pay | Admitting: Physician Assistant

## 2020-01-06 ENCOUNTER — Telehealth: Payer: Self-pay | Admitting: Orthopaedic Surgery

## 2020-01-06 DIAGNOSIS — R194 Change in bowel habit: Secondary | ICD-10-CM

## 2020-01-06 NOTE — Telephone Encounter (Signed)
Patient requests call from you.

## 2020-01-06 NOTE — Telephone Encounter (Signed)
She says getting barium enema study. PA told her she could get a perf.

## 2020-01-06 NOTE — Telephone Encounter (Signed)
Pt called in stating she had something to discuss with Dr. Lorin Mercy and that was all she wanted to disclose with me. Pt would like a call back.  [phone]

## 2020-01-12 ENCOUNTER — Ambulatory Visit
Admission: RE | Admit: 2020-01-12 | Discharge: 2020-01-12 | Disposition: A | Discharge status: Home / Self Care | Payer: Medicare Other | Source: Ambulatory Visit | Attending: Physician Assistant | Admitting: Physician Assistant

## 2020-01-12 ENCOUNTER — Other Ambulatory Visit: Payer: Self-pay | Admitting: Physician Assistant

## 2020-01-12 DIAGNOSIS — R194 Change in bowel habit: Secondary | ICD-10-CM

## 2020-01-17 ENCOUNTER — Ambulatory Visit (INDEPENDENT_AMBULATORY_CARE_PROVIDER_SITE_OTHER): Payer: Medicare Other | Admitting: Orthopaedic Surgery

## 2020-01-17 ENCOUNTER — Other Ambulatory Visit: Payer: Self-pay

## 2020-01-17 ENCOUNTER — Encounter: Payer: Self-pay | Admitting: Orthopaedic Surgery

## 2020-01-17 VITALS — Ht 64.5 in | Wt 114.0 lb

## 2020-01-17 DIAGNOSIS — M4126 Other idiopathic scoliosis, lumbar region: Secondary | ICD-10-CM | POA: Diagnosis not present

## 2020-01-17 NOTE — Progress Notes (Signed)
Office Visit Note   Patient: Caitlyn [name]           Date of Birth: [birth year]           MRN: [id number] Visit Date: 01/17/2020              Requested by: Hulan Fess, MD [place],  [place] [address] PCP: Hulan Fess, MD   Assessment & Plan: Visit Diagnoses:  1. Other idiopathic scoliosis, lumbar region     Plan: Patient had right hip right groin pain radiating into her thigh after straining trying to have a bowel movement prior to colonoscopy.  She has had problems with impactions in the past.  She is actually walking better today and can walk without the cane.  She can return if her symptoms recur or worsen.  Follow-Up Instructions: No follow-ups on file.   Orders:  No orders of the defined types were placed in this encounter.  No orders of the defined types were placed in this encounter.     Procedures: No procedures performed   Clinical Data: No additional findings.   Subjective: Chief Complaint  Patient presents with  . Lower Back - Pain  . Right Knee - Pain    HPI 86 year old female returns she states she was straining trying to have a bowel movement and suddenly started having some pain in her right groin that radiated toward her right knee and she had great difficulty walking had to use crutches.  Today she states she is better and has been able ambulate with a cane.  She has had problems with chronic constipation likely has an impaction like she had 4 years ago as well as 2 years ago.  She was trying to go through a prep for colonoscopy but still had residual stool and it was canceled and she apparently is being rescheduled with a new prep.  She is tried all types of stool softeners laxatives enemas etc.  She states she noticed small lymph node or nodule in the right groin but this is resolved.  Patient has significant scoliosis with significant foraminal stenosis at multiple levels.  Review of Systems updated unchanged  noncontributory to HPI.  No chills or fever.  Chronic constipation as above.   Objective: Vital Signs: Ht 5' 4.5" (1.638 m)   Wt 114 lb (51.7 kg)   BMI 19.27 kg/m   Physical Exam Constitutional:      Appearance: She is well-developed.  HENT:     Head: Normocephalic.     Right Ear: External ear normal.     Left Ear: External ear normal.  Eyes:     Pupils: Pupils are equal, round, and reactive to light.  Neck:     Thyroid: No thyromegaly.     Trachea: No tracheal deviation.  Cardiovascular:     Rate and Rhythm: Normal rate.  Pulmonary:     Effort: Pulmonary effort is normal.  Abdominal:     Palpations: Abdomen is soft.  Skin:    General: Skin is warm and dry.  Neurological:     Mental Status: She is alert and oriented to person, place, and time.  Psychiatric:        Behavior: Behavior normal.     Ortho Exam patient has negative logroll hips no inguinal lymphadenopathy normal femoral pulse knee reaches full extension good quad strength.  She is now able to do a straight leg raise which she could not do yesterday.  Knee and ankle  jerk are intact.  Anterior tib gastrocsoleus is intact. Specialty Comments:  No specialty comments available.  Imaging: No results found.   PMFS History: Patient Active Problem List   Diagnosis Date Noted  . Unilateral primary osteoarthritis, right knee 05/17/2019  . Cervical facet syndrome 04/28/2019  . History of fusion of cervical spine 03/10/2018  . Neck pain 03/02/2018  . Age-related osteoporosis without current pathological fracture 01/01/2018  . Abnormal CXR 05/25/2017  . Hypertensive retinopathy 12/04/2015  . Chest pain with moderate risk for cardiac etiology 10/15/2015  . Scoliosis deformity of spine 10/15/2015  . Hyperlipidemia 10/16/2013  . Essential hypertension 10/16/2013  . Mitral valve prolapse 10/16/2013   Past Medical History:  Diagnosis Date  . AC (acromioclavicular) joint bone spurs 2008   Back  . Anemia   .  Arthritis   . Blood transfusion   . Bronchitis   . COPD (chronic obstructive pulmonary disease) ([place])   . GERD (gastroesophageal reflux disease)    uses tums  . Hepatitis    1970's  . Hyperlipidemia   . Hypertension   . Mitral valve prolapse 10/16/2013  . MVP (mitral valve prolapse)    with mild to mod MR and TR 04/2012; followed by Dr. Rollene Fare [facility])  . Osteoporosis   . Sleep apnea    no treatment at this time, had sleep study apprx 8 years ago    Family History  Problem Relation Age of Onset  . Alzheimer's disease Mother   . Alzheimer's disease Father     Past Surgical History:  Procedure Laterality Date  . ABDOMINAL HYSTERECTOMY  1964  . ANTERIOR CERVICAL DECOMP/DISCECTOMY FUSION    . [place]  . HERNIA REPAIR    . INGUINAL HERNIA REPAIR  06/16/2012   Procedure: HERNIA REPAIR INGUINAL ADULT;  Surgeon: Madilyn Hook, DO;  Location: [place];  Service: General;  Laterality: Right;  . KNEE ARTHROSCOPY  2007 & 2008  . NM MYOCAR PERF WALL MOTION  05/27/2011   normal  . SPINE SURGERY    . TONGUE BIOPSY  2000  . US ECHOCARDIOGRAPHY  06/07/2012   mild concentric LVH,LA mod. dilated,RA is mild to mod. dilated, mild MVP,prolapse of the posterior leaflet,mild mitral annular ca+,mild to mod MR & TR, AOV mildly sclerotic, aortic root sclerosis/ca+, insignificant pericardial effusion.  Marland Kitchen UVULECTOMY     Social History   Occupational History  . Not on file  Tobacco Use  . Smoking status: Never Smoker  . Smokeless tobacco: Never Used  Substance and Sexual Activity  . Alcohol use: Yes    Comment: occassional  . Drug use: No  . Sexual activity: Not on file

## 2020-01-18 ENCOUNTER — Other Ambulatory Visit: Payer: Self-pay | Admitting: Physician Assistant

## 2020-01-18 DIAGNOSIS — R194 Change in bowel habit: Secondary | ICD-10-CM

## 2020-01-18 DIAGNOSIS — K5900 Constipation, unspecified: Secondary | ICD-10-CM

## 2020-01-23 ENCOUNTER — Ambulatory Visit
Admission: RE | Admit: 2020-01-23 | Discharge: 2020-01-23 | Disposition: A | Discharge status: Home / Self Care | Payer: Medicare Other | Source: Ambulatory Visit | Attending: Physician Assistant | Admitting: Physician Assistant

## 2020-01-23 ENCOUNTER — Other Ambulatory Visit: Payer: Self-pay | Admitting: Physician Assistant

## 2020-01-23 ENCOUNTER — Other Ambulatory Visit: Payer: Self-pay

## 2020-01-23 DIAGNOSIS — K5649 Other impaction of intestine: Secondary | ICD-10-CM

## 2020-02-03 ENCOUNTER — Ambulatory Visit
Admission: RE | Admit: 2020-02-03 | Discharge: 2020-02-03 | Disposition: A | Discharge status: Home / Self Care | Payer: Medicare Other | Source: Ambulatory Visit | Attending: Physician Assistant | Admitting: Physician Assistant

## 2020-02-03 DIAGNOSIS — R194 Change in bowel habit: Secondary | ICD-10-CM

## 2020-02-03 DIAGNOSIS — K5900 Constipation, unspecified: Secondary | ICD-10-CM

## 2020-02-16 IMAGING — CT CT ABD-PELV W/ CM
2 of 5 series · 13 of 46 positions shown, 15 images · IV contrast (iopamidol)
Comparison: 09/23/2018

CLINICAL DATA: Progressive chronic lower abdominal pain. Previous
hysterectomy and inguinal hernia repair.

EXAM:
CT ABDOMEN AND PELVIS WITH CONTRAST
TECHNIQUE: Multidetector CT imaging of the abdomen and pelvis was performed
using the standard protocol following bolus administration of
intravenous contrast.
CONTRAST:  100mL 2DRPM5-2UU IOPAMIDOL (2DRPM5-2UU) INJECTION 61%

[Series 2: abd pelvis 5.00 br40 s3 axial · axial · 0.51mm/px · z∈[+1478,+1828]mm · 10 of 80 slices shown, 12 images]
[im 5/80  soft-tissue]
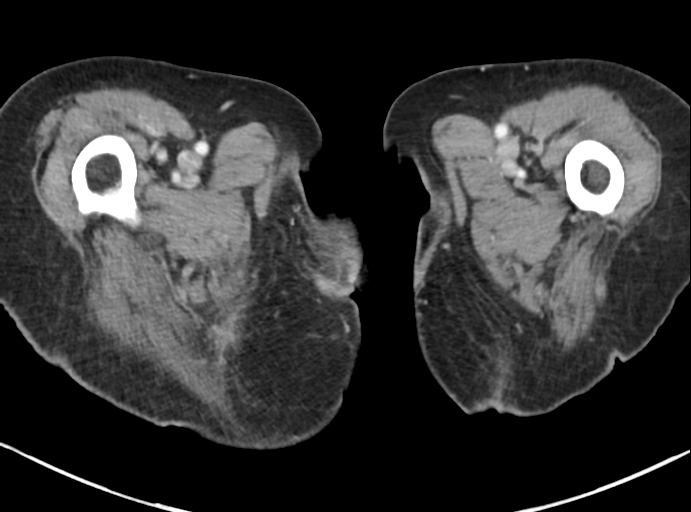
[im 5/80  bone]
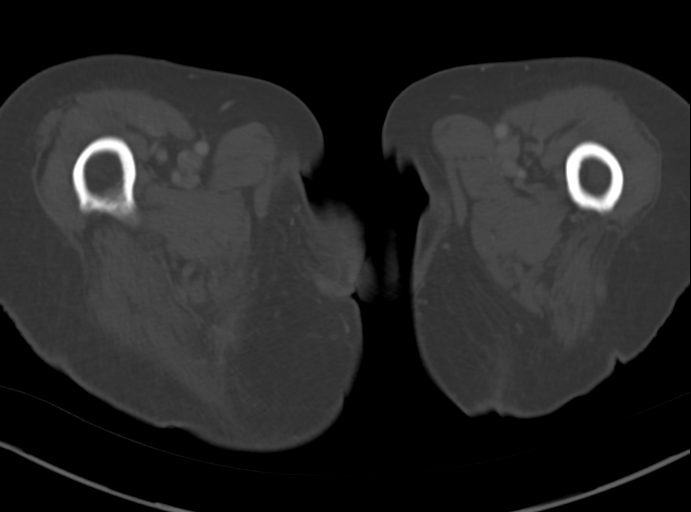
[im 14/80  soft-tissue]
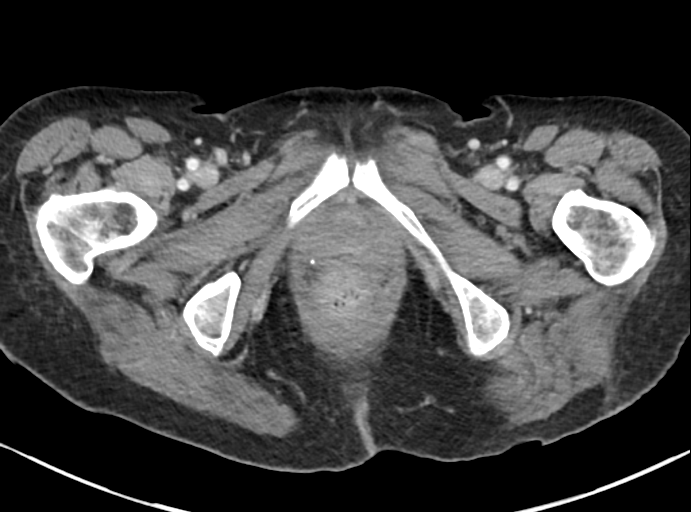
[im 22/80  soft-tissue]
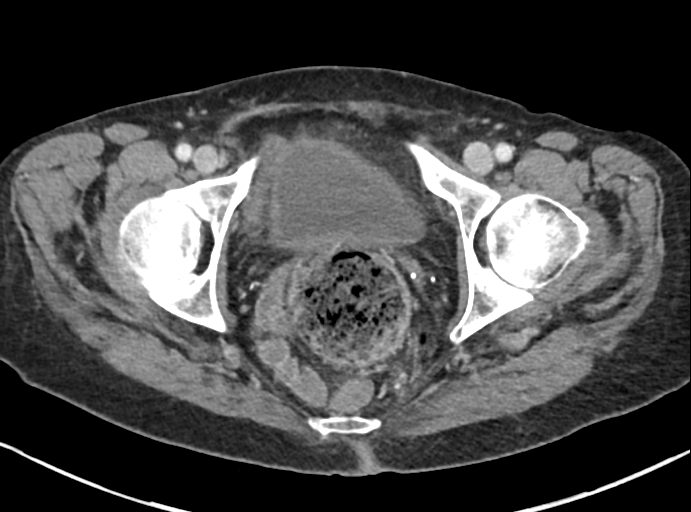
[im 27/80  soft-tissue]
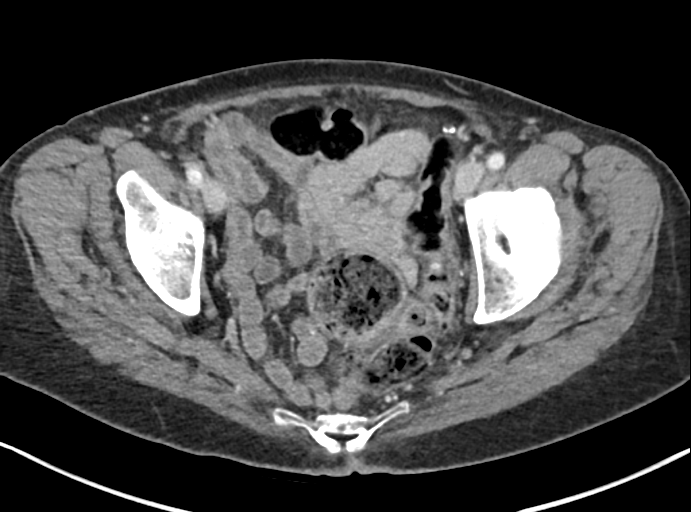
[im 36/80  soft-tissue]
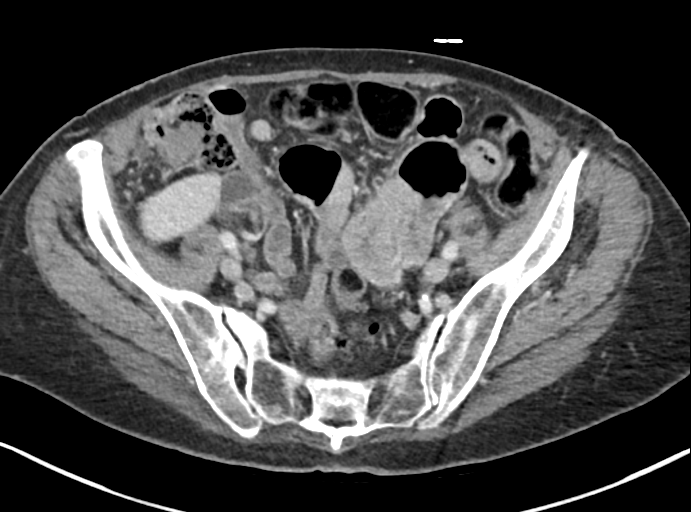
[im 44/80  soft-tissue]
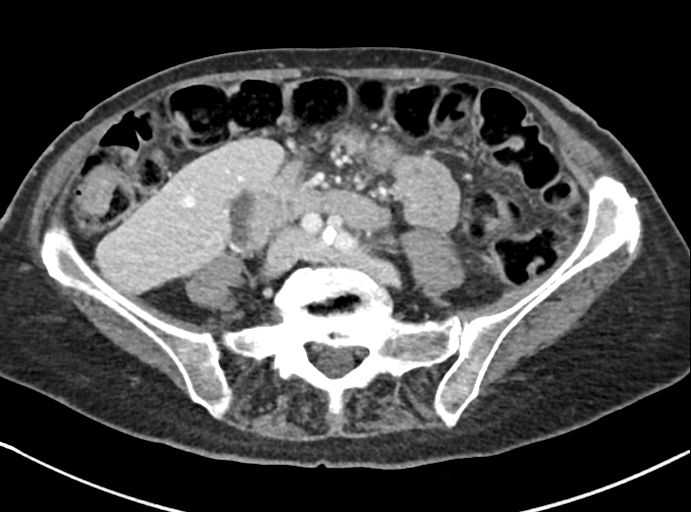
[im 53/80  soft-tissue]
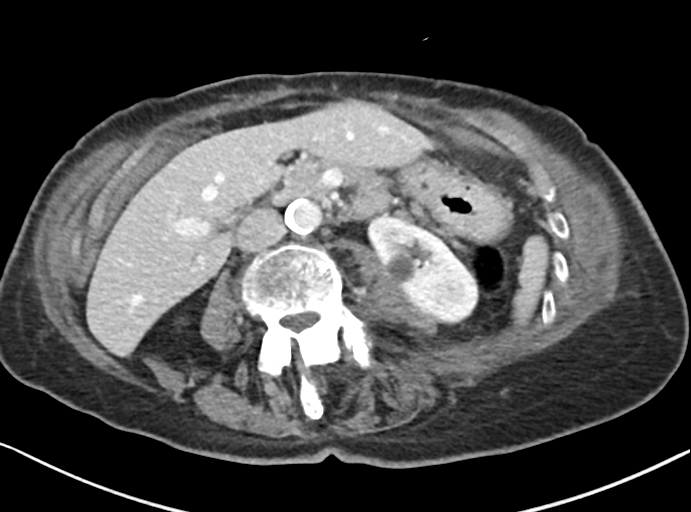
[im 58/80  soft-tissue]
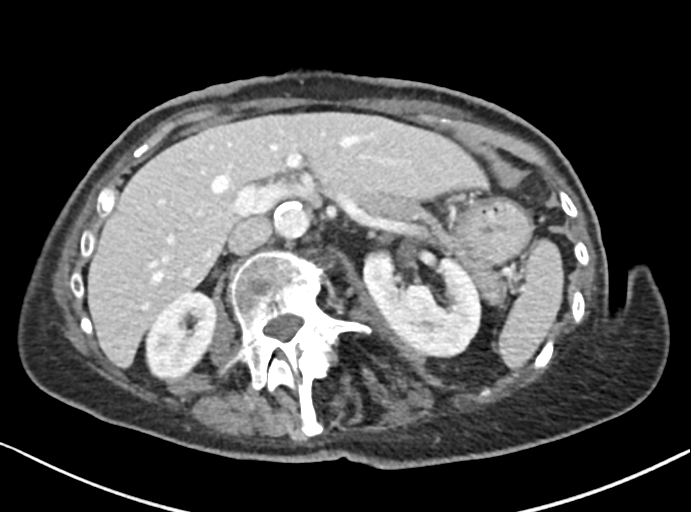
[im 66/80  soft-tissue]
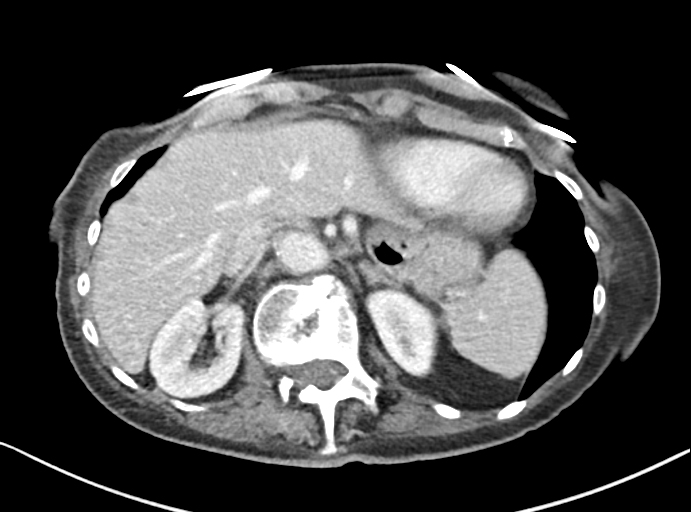
[im 66/80  bone]
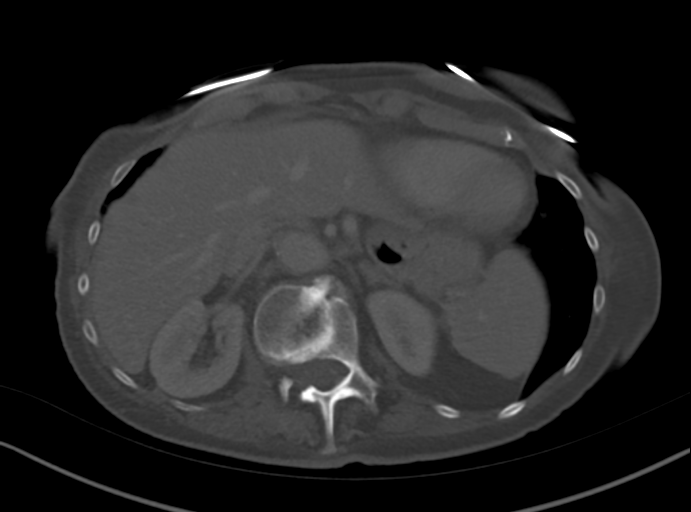
[im 75/80  soft-tissue]
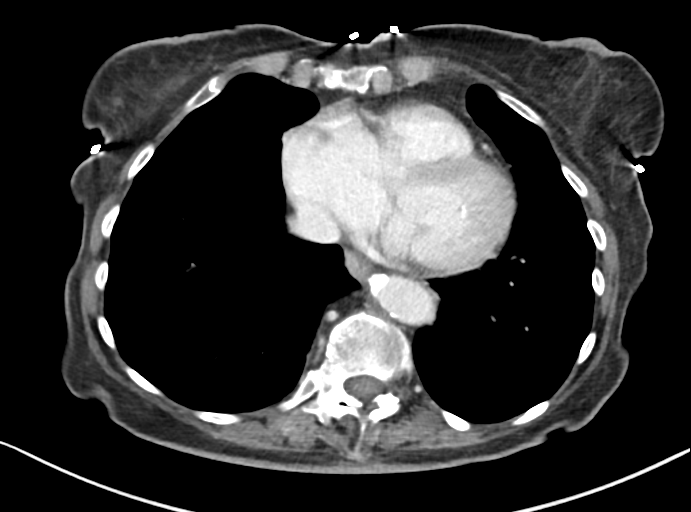

[Series 6: abd pelvis 2.00 br40 s3 cor · coronal · 0.68mm/px · 3 of 128 slices shown]
[im 43/128  soft-tissue]
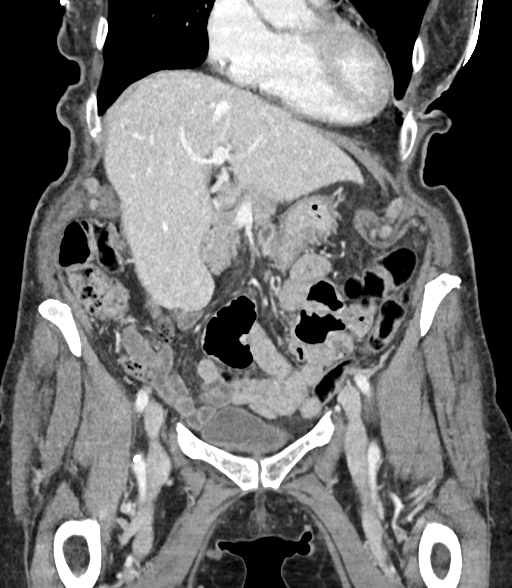
[im 57/128  soft-tissue]
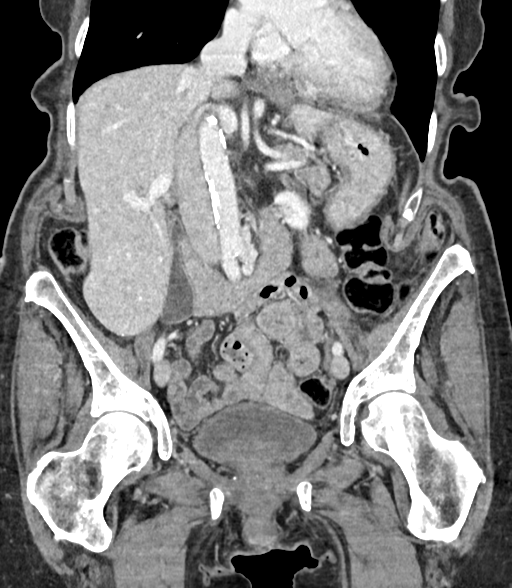
[im 71/128  soft-tissue]
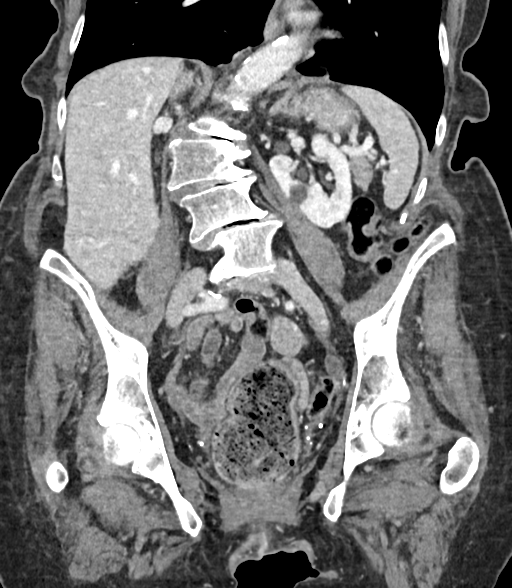

[13 of 46 positions shown; findings below may reference images not displayed]

FINDINGS: Lower Chest: No acute findings.

Hepatobiliary: No hepatic masses identified. Stable tiny sub-cm cyst
in the left lobe adjacent to the falciform ligament. Gallstones are
seen, however there is no evidence of cholecystitis or biliary
dilatation.

Pancreas:  No mass or inflammatory changes.

Spleen: Within normal limits in size and appearance.

Adrenals/Urinary Tract: No masses identified. Small left renal cyst
is stable. No evidence of hydronephrosis.

Stomach/Bowel: No evidence of obstruction, inflammatory process or
abnormal fluid collections. Diverticulosis is seen mainly involving
the sigmoid colon, however there is no evidence of diverticulitis.

Vascular/Lymphatic: No pathologically enlarged lymph nodes. No
abdominal aortic aneurysm. Aortic atherosclerosis.

Reproductive: Prior hysterectomy noted. Adnexal regions are
unremarkable in appearance.

Other:  No evidence of inguinal or ventral abdominal wall hernia.

Musculoskeletal: No suspicious bone lesions identified. Advanced
lumbar spine degenerative spondylosis and dextroscoliosis noted.
IMPRESSION: 1. Mild sigmoid diverticulosis. No radiographic evidence of
diverticulitis or other acute findings.
2. Cholelithiasis. No radiographic evidence of cholecystitis.

Aortic Atherosclerosis (UCR76-G25.5).

## 2020-03-01 ENCOUNTER — Telehealth: Payer: Self-pay | Admitting: Radiology

## 2020-03-01 ENCOUNTER — Ambulatory Visit: Payer: Medicare Other | Admitting: Surgery

## 2020-03-01 NOTE — Telephone Encounter (Signed)
Patient left message at [facility] office stating she thought she was having a heart attack and requested call from Dr. Lorin Mercy.  I called patient who states that she had spoken with Dr. Eddie Dibbles office this morning due to having some pains and made an appointment for 3:30p this afternoon. Dr. Eddie Dibbles office called back and told patient that she may be having a heart attack and needed to get to the hospital. Patient had EMS come out and they told her that everything checked out ok with her heart. Per patient, it is excellent for her age.  She had called [facility] office when EMS was there at the home because they felt some of her pains could be coming from her spine. When I spoke with patient, she asked me to get Dr. Lorin Mercy the message that she was not having a heart attack and was not going to the hospital. She does not need to speak with him unless he feels that she needs to come in for an appointment.  Dr. Lorin Mercy aware. I asked patient to call if she had any further questions.

## 2020-03-27 ENCOUNTER — Other Ambulatory Visit: Payer: Self-pay | Admitting: Physician Assistant

## 2020-03-27 ENCOUNTER — Ambulatory Visit
Admission: RE | Admit: 2020-03-27 | Discharge: 2020-03-27 | Disposition: A | Discharge status: Home / Self Care | Payer: Medicare Other | Source: Ambulatory Visit | Attending: Physician Assistant | Admitting: Physician Assistant

## 2020-03-27 DIAGNOSIS — R140 Abdominal distension (gaseous): Secondary | ICD-10-CM

## 2020-05-10 ENCOUNTER — Ambulatory Visit
Admission: RE | Admit: 2020-05-10 | Discharge: 2020-05-10 | Disposition: A | Discharge status: Home / Self Care | Payer: Medicare Other | Source: Ambulatory Visit | Attending: Physician Assistant | Admitting: Physician Assistant

## 2020-05-10 ENCOUNTER — Other Ambulatory Visit: Payer: Self-pay | Admitting: Physician Assistant

## 2020-05-10 DIAGNOSIS — Z8719 Personal history of other diseases of the digestive system: Secondary | ICD-10-CM

## 2020-05-10 DIAGNOSIS — K5901 Slow transit constipation: Secondary | ICD-10-CM

## 2020-05-10 DIAGNOSIS — R1084 Generalized abdominal pain: Secondary | ICD-10-CM

## 2020-05-23 ENCOUNTER — Ambulatory Visit
Admission: RE | Admit: 2020-05-23 | Discharge: 2020-05-23 | Disposition: A | Discharge status: Home / Self Care | Payer: Medicare Other | Source: Ambulatory Visit | Attending: Physician Assistant | Admitting: Physician Assistant

## 2020-05-23 ENCOUNTER — Other Ambulatory Visit: Payer: Self-pay | Admitting: Physician Assistant

## 2020-05-23 DIAGNOSIS — R197 Diarrhea, unspecified: Secondary | ICD-10-CM

## 2020-05-23 DIAGNOSIS — K5900 Constipation, unspecified: Secondary | ICD-10-CM

## 2020-05-23 DIAGNOSIS — R109 Unspecified abdominal pain: Secondary | ICD-10-CM

## 2020-05-29 ENCOUNTER — Ambulatory Visit
Admission: RE | Admit: 2020-05-29 | Discharge: 2020-05-29 | Disposition: A | Discharge status: Home / Self Care | Payer: Medicare Other | Source: Ambulatory Visit | Attending: Physician Assistant | Admitting: Physician Assistant

## 2020-05-29 ENCOUNTER — Other Ambulatory Visit: Payer: Self-pay | Admitting: Physician Assistant

## 2020-05-29 DIAGNOSIS — R109 Unspecified abdominal pain: Secondary | ICD-10-CM

## 2020-05-29 DIAGNOSIS — K5900 Constipation, unspecified: Secondary | ICD-10-CM

## 2020-06-12 ENCOUNTER — Ambulatory Visit: Payer: Medicare Other | Admitting: Orthopaedic Surgery

## 2020-06-29 ENCOUNTER — Telehealth: Payer: Self-pay

## 2020-06-29 NOTE — Telephone Encounter (Signed)
Patient called in wanting to get sch with dr newton for injection

## 2020-07-02 NOTE — Telephone Encounter (Signed)
Patient states that she "has a lot new going on." She will see Dr. Lorin Mercy to evaluate.

## 2020-07-02 NOTE — Telephone Encounter (Signed)
Ok to repeat if nothing new, if new concerns then keep appt with Dr. Lorin Mercy - Keep his appointment either way

## 2020-07-02 NOTE — Telephone Encounter (Signed)
Patient had right L4 TF on 10/03/2019. Has appointment scheduled with Dr. Lorin Mercy on 8/31. Please advise.

## 2020-07-10 ENCOUNTER — Other Ambulatory Visit: Payer: Self-pay

## 2020-07-10 ENCOUNTER — Encounter: Payer: Self-pay | Admitting: Orthopaedic Surgery

## 2020-07-10 ENCOUNTER — Ambulatory Visit: Payer: Medicare Other | Admitting: Orthopaedic Surgery

## 2020-07-10 VITALS — Ht 64.5 in | Wt 109.0 lb

## 2020-07-10 DIAGNOSIS — M4126 Other idiopathic scoliosis, lumbar region: Secondary | ICD-10-CM

## 2020-07-10 DIAGNOSIS — M47816 Spondylosis without myelopathy or radiculopathy, lumbar region: Secondary | ICD-10-CM

## 2020-07-10 DIAGNOSIS — M542 Cervicalgia: Secondary | ICD-10-CM

## 2020-07-11 NOTE — Progress Notes (Signed)
Office Visit Note   Patient: Caitlyn [name]           Date of Birth: September 13, 1930           MRN: [id number] Visit Date: 07/10/2020              Requested by: Hulan Fess, MD [place],  [place] [address] PCP: Hulan Fess, MD   Assessment & Plan: Visit Diagnoses:  1. Other idiopathic scoliosis, lumbar region     Plan: We will set her up for an epidural at her request.  Appointment weekly with Dr. Sharol Given she likely needs some compression since she has venous stasis changes and with these biopsies is taken her weeks to months for them to gradually heal.  Follow-Up Instructions: No follow-ups on file.   Orders:  No orders of the defined types were placed in this encounter.  No orders of the defined types were placed in this encounter.     Procedures: No procedures performed   Clinical Data: No additional findings.   Subjective: Chief Complaint  Patient presents with  . Lower Back - Pain    HPI patient returns with persistent problems with back pain.  States she is also had some neck pain and she states she would like to have a repeat injection with Dr. Ernestina Patches has not had one in more than 6 months.  She states she is on Prolia shots with Dr. Layne Benton for her osteoporosis.  She is also had numerous biopsies done on her leg for some superficial skin cancers not melanoma and states she has had extremely poor healing and has noticed dark discoloration of her legs with slow healing biopsy wounds.  Review of Systems Review of systems updated unchanged from last follow-up visit other than as mentioned in HPI.  Objective: Vital Signs: Ht 5' 4.5" (1.638 m)   Wt 109 lb (49.4 kg)   BMI 18.42 kg/m   Physical Exam Constitutional:      Appearance: She is well-developed.  HENT:     Head: Normocephalic.     Right Ear: External ear normal.     Left Ear: External ear normal.  Eyes:     Pupils: Pupils are equal, round, and reactive to light.  Neck:      Thyroid: No thyromegaly.     Trachea: No tracheal deviation.  Cardiovascular:     Rate and Rhythm: Normal rate.  Pulmonary:     Effort: Pulmonary effort is normal.  Abdominal:     Palpations: Abdomen is soft.  Skin:    General: Skin is warm and dry.  Neurological:     Mental Status: She is alert and oriented to person, place, and time.  Psychiatric:        Behavior: Behavior normal.     Ortho Exam patient has intact reflexes negative logroll the hips.  Dark black discoloration venous stasis changes both legs with pitting edema.  Anterior tib gastrocsoleus is intact she is able to ambulate.  Specialty Comments:  No specialty comments available.  Imaging: No results found.   PMFS History: Patient Active Problem List   Diagnosis Date Noted  . Unilateral primary osteoarthritis, right knee 05/17/2019  . Cervical facet syndrome 04/28/2019  . History of fusion of cervical spine 03/10/2018  . Neck pain 03/02/2018  . Age-related osteoporosis without current pathological fracture 01/01/2018  . Abnormal CXR 05/25/2017  . Hypertensive retinopathy 12/04/2015  . Chest pain with moderate risk for cardiac etiology 10/15/2015  .  Scoliosis deformity of spine 10/15/2015  . Hyperlipidemia 10/16/2013  . Essential hypertension 10/16/2013  . Mitral valve prolapse 10/16/2013   Past Medical History:  Diagnosis Date  . AC (acromioclavicular) joint bone spurs 2008   Back  . Anemia   . Arthritis   . Blood transfusion   . Bronchitis   . COPD (chronic obstructive pulmonary disease) ([place])   . GERD (gastroesophageal reflux disease)    uses tums  . Hepatitis    1970's  . Hyperlipidemia   . Hypertension   . Mitral valve prolapse 10/16/2013  . MVP (mitral valve prolapse)    with mild to mod MR and TR 04/2012; followed by Dr. Rollene Fare [facility])  . Osteoporosis   . Sleep apnea    no treatment at this time, had sleep study apprx 8 years ago    Family History  Problem Relation Age of Onset  .  Alzheimer's disease Mother   . Alzheimer's disease Father     Past Surgical History:  Procedure Laterality Date  . ABDOMINAL HYSTERECTOMY  1964  . ANTERIOR CERVICAL DECOMP/DISCECTOMY FUSION    . [place]  . HERNIA REPAIR    . INGUINAL HERNIA REPAIR  06/16/2012   Procedure: HERNIA REPAIR INGUINAL ADULT;  Surgeon: Madilyn Hook, DO;  Location: [facility];  Service: General;  Laterality: Right;  . KNEE ARTHROSCOPY  2007 & 2008  . NM MYOCAR PERF WALL MOTION  05/27/2011   normal  . SPINE SURGERY    . TONGUE BIOPSY  2000  . US ECHOCARDIOGRAPHY  06/07/2012   mild concentric LVH,LA mod. dilated,RA is mild to mod. dilated, mild MVP,prolapse of the posterior leaflet,mild mitral annular ca+,mild to mod MR & TR, AOV mildly sclerotic, aortic root sclerosis/ca+, insignificant pericardial effusion.  Marland Kitchen UVULECTOMY     Social History   Occupational History  . Not on file  Tobacco Use  . Smoking status: Never Smoker  . Smokeless tobacco: Never Used  Substance and Sexual Activity  . Alcohol use: Yes    Comment: occassional  . Drug use: No  . Sexual activity: Not on file

## 2020-07-17 ENCOUNTER — Encounter: Payer: Self-pay | Admitting: Orthopedic Surgery

## 2020-07-17 ENCOUNTER — Ambulatory Visit: Payer: Medicare Other | Admitting: Orthopedic Surgery

## 2020-07-17 VITALS — Ht 64.0 in | Wt 109.0 lb

## 2020-07-17 DIAGNOSIS — L97919 Non-pressure chronic ulcer of unspecified part of right lower leg with unspecified severity: Secondary | ICD-10-CM

## 2020-07-17 DIAGNOSIS — L97929 Non-pressure chronic ulcer of unspecified part of left lower leg with unspecified severity: Secondary | ICD-10-CM | POA: Diagnosis not present

## 2020-07-17 NOTE — Progress Notes (Signed)
Office Visit Note   Patient: Caitlyn [name]           Date of Birth: 01-11-1930           MRN: [id number] Visit Date: 07/17/2020              Requested by: Caitlyn Fess, MD [place],  [place] [address] PCP: Caitlyn Fess, MD  Chief Complaint  Patient presents with  . Right Leg - Edema  . Left Leg - Edema      HPI: Patient is an 86 year old woman who is seen in referral from Dr. Lorin [name].  Patient states she has had chronic swelling in both legs and recently has had a biopsy for skin cancer in her left leg with a nonhealing ulcer from the biopsy.  Patient is concerned with the skin discoloration.  Assessment & Plan: Visit Diagnoses:  1. Chronic venous hypertension (idiopathic) with ulcer and inflammation of bilateral lower extremity (HCC)     Plan: Will apply Dynaflex wraps to both legs with a Prisma dressing over the left leg biopsy site.  Patient was given a prescription for compression stockings size medium she will bring this at follow-up appointment and we will show her how to get the new socks on.  Follow-Up Instructions: Return in about 1 week (around 07/24/2020).   Ortho Exam  Patient is alert, oriented, no adenopathy, well-dressed, normal affect, normal respiratory effort. Examination patient has a good dorsalis pedis pulse bilaterally she has brawny skin color changes in both legs with a ulcer from the proximal tibia biopsy on the left the ulcerative tissue area is 3 cm in diameter.  Patient has brawny skin color changes in both legs with pitting edema with generalized tenderness to palpation.  There is no pain with dorsiflexion of the ankle with the knee extended no evidence of a DVT.  Patient's both calves measured 33 cm in circumference.  Imaging: No results found. No images are attached to the encounter.  Labs: No results found for: HGBA1C, ESRSEDRATE, CRP, LABURIC, REPTSTATUS, GRAMSTAIN, CULT, LABORGA   Lab Results  Component Value Date     ALBUMIN 4.7 12/29/2017   ALBUMIN 4.1 11/27/2015   ALBUMIN 4.6 05/23/2014    No results found for: MG No results found for: VD25OH  No results found for: PREALBUMIN CBC EXTENDED Latest Ref Rng & Units 01/27/2019 06/15/2012 08/07/2008  WBC 3.4 - 10.8 x10E3/uL 5.8 6.0 5.9  RBC 3.77 - 5.28 x10E6/uL 3.92 4.26 3.82(L)  HGB 11.1 - 15.9 g/dL 12.5 13.3 12.6  HCT 34.0 - 46.6 % 36.8 39.9 36.5  PLT 150 - 450 x10E3/uL 202 213 224  NEUTROABS - - - 3.5  LYMPHSABS - - - 1.8     Body mass index is 18.71 kg/m.  Orders:  No orders of the defined types were placed in this encounter.  No orders of the defined types were placed in this encounter.    Procedures: No procedures performed  Clinical Data: No additional findings.  ROS:  All other systems negative, except as noted in the HPI. Review of Systems  Objective: Vital Signs: Ht 5\' 4"  (1.626 m)   Wt 109 lb (49.4 kg)   BMI 18.71 kg/m   Specialty Comments:  No specialty comments available.  PMFS History: Patient Active Problem List   Diagnosis Date Noted  . Unilateral primary osteoarthritis, right knee 05/17/2019  . Cervical facet syndrome 04/28/2019  . History of fusion of cervical spine 03/10/2018  . Neck  pain 03/02/2018  . Age-related osteoporosis without current pathological fracture 01/01/2018  . Abnormal CXR 05/25/2017  . Hypertensive retinopathy 12/04/2015  . Chest pain with moderate risk for cardiac etiology 10/15/2015  . Scoliosis deformity of spine 10/15/2015  . Hyperlipidemia 10/16/2013  . Essential hypertension 10/16/2013  . Mitral valve prolapse 10/16/2013   Past Medical History:  Diagnosis Date  . AC (acromioclavicular) joint bone spurs 2008   Back  . Anemia   . Arthritis   . Blood transfusion   . Bronchitis   . COPD (chronic obstructive pulmonary disease) ([place])   . GERD (gastroesophageal reflux disease)    uses tums  . Hepatitis    1970's  . Hyperlipidemia   . Hypertension   . Mitral valve  prolapse 10/16/2013  . MVP (mitral valve prolapse)    with mild to mod MR and TR 04/2012; followed by Dr. Rollene Fare [facility])  . Osteoporosis   . Sleep apnea    no treatment at this time, had sleep study apprx 8 years ago    Family History  Problem Relation Age of Onset  . Alzheimer's disease Mother   . Alzheimer's disease Father     Past Surgical History:  Procedure Laterality Date  . ABDOMINAL HYSTERECTOMY  1964  . ANTERIOR CERVICAL DECOMP/DISCECTOMY FUSION    . [place]  . HERNIA REPAIR    . INGUINAL HERNIA REPAIR  06/16/2012   Procedure: HERNIA REPAIR INGUINAL ADULT;  Surgeon: Madilyn Hook, DO;  Location: [facility];  Service: General;  Laterality: Right;  . KNEE ARTHROSCOPY  2007 & 2008  . NM MYOCAR PERF WALL MOTION  05/27/2011   normal  . SPINE SURGERY    . TONGUE BIOPSY  2000  . US ECHOCARDIOGRAPHY  06/07/2012   mild concentric LVH,LA mod. dilated,RA is mild to mod. dilated, mild MVP,prolapse of the posterior leaflet,mild mitral annular ca+,mild to mod MR & TR, AOV mildly sclerotic, aortic root sclerosis/ca+, insignificant pericardial effusion.  Marland Kitchen UVULECTOMY     Social History   Occupational History  . Not on file  Tobacco Use  . Smoking status: Never Smoker  . Smokeless tobacco: Never Used  Substance and Sexual Activity  . Alcohol use: Yes    Comment: occassional  . Drug use: No  . Sexual activity: Not on file

## 2020-07-19 ENCOUNTER — Telehealth: Payer: Self-pay | Admitting: Orthopedic Surgery

## 2020-07-19 NOTE — Telephone Encounter (Signed)
Patient called   She said she can in recently and had both of her legs wrapped. She is stated that her right leg feels it may have been wrapped too tight as she is experiencing a lot of pain in the area.   Call back: [phone]

## 2020-07-19 NOTE — Telephone Encounter (Signed)
I called pt and offered appt today to remove dressing and she said that she will work on better elevation during the day and call if there are any problems.

## 2020-07-23 ENCOUNTER — Telehealth: Payer: Self-pay

## 2020-07-23 NOTE — Telephone Encounter (Signed)
Patient would like to know if she could be worked in to have wraps removed today instead of tomorrow due to trying to get the right size compression stockings before her appointment.  CB# [phone].  Please advise.  Thank you.

## 2020-07-23 NOTE — Telephone Encounter (Signed)
I called pt and she was wanting an appt today to remove wraps and then keep appt tomorrow to come in with socks. Advised we can just keep the appt tomorrow and apply ace wrap and she can get the socks after her visit so she does not have to come in twice. Voiced understanding and will call with any other questions.

## 2020-07-24 ENCOUNTER — Ambulatory Visit (INDEPENDENT_AMBULATORY_CARE_PROVIDER_SITE_OTHER): Payer: Medicare Other | Admitting: Orthopedic Surgery

## 2020-07-24 ENCOUNTER — Encounter: Payer: Self-pay | Admitting: Physician Assistant

## 2020-07-24 VITALS — Ht 64.0 in | Wt 109.0 lb

## 2020-07-24 DIAGNOSIS — I87333 Chronic venous hypertension (idiopathic) with ulcer and inflammation of bilateral lower extremity: Secondary | ICD-10-CM | POA: Diagnosis not present

## 2020-07-24 DIAGNOSIS — L97929 Non-pressure chronic ulcer of unspecified part of left lower leg with unspecified severity: Secondary | ICD-10-CM

## 2020-07-24 DIAGNOSIS — L97919 Non-pressure chronic ulcer of unspecified part of right lower leg with unspecified severity: Secondary | ICD-10-CM | POA: Diagnosis not present

## 2020-07-26 ENCOUNTER — Telehealth: Payer: Self-pay | Admitting: Physical Medicine and Rehabilitation

## 2020-07-26 NOTE — Telephone Encounter (Signed)
Return pt call and inform her about her appt sch on Monday.

## 2020-07-26 NOTE — Telephone Encounter (Signed)
Patient called requesting a earlier in the day appt. Please call patient. She wanted to check for any cancellations. Patient phone number is [phone].

## 2020-07-30 ENCOUNTER — Ambulatory Visit: Payer: Self-pay

## 2020-07-30 ENCOUNTER — Ambulatory Visit (INDEPENDENT_AMBULATORY_CARE_PROVIDER_SITE_OTHER): Payer: Medicare Other | Admitting: Physical Medicine and Rehabilitation

## 2020-07-30 ENCOUNTER — Other Ambulatory Visit: Payer: Self-pay

## 2020-07-30 ENCOUNTER — Encounter: Payer: Self-pay | Admitting: Physical Medicine and Rehabilitation

## 2020-07-30 VITALS — BP 128/74 | HR 83

## 2020-07-30 DIAGNOSIS — M48062 Spinal stenosis, lumbar region with neurogenic claudication: Secondary | ICD-10-CM | POA: Diagnosis not present

## 2020-07-30 MED ORDER — METHYLPREDNISOLONE ACETATE 80 MG/ML IJ SUSP
80.0000 mg | Freq: Once | INTRAMUSCULAR | Status: AC
  Administered 2020-07-30: 80 mg

## 2020-07-30 NOTE — Progress Notes (Signed)
Right L4. Pt states lower back pain. Pt state standing and walking for a long period of time. Pt state she will lay down flat on the bed to ease the pain. Pt has hx of inj on 10/03/2019 Pt state it was fine.  Numeric Pain Rating Scale and Functional Assessment Average Pain 4   In the last MONTH (on 0-10 scale) has pain interfered with the following?  1. General activity like being  able to carry out your everyday physical activities such as walking, climbing stairs, carrying groceries, or moving a chair?  Rating(9)   +Driver, -BT, -Dye Allergies.

## 2020-07-31 ENCOUNTER — Ambulatory Visit: Payer: Medicare Other | Admitting: Orthopedic Surgery

## 2020-07-31 ENCOUNTER — Ambulatory Visit: Payer: Medicare Other | Admitting: Orthopaedic Surgery

## 2020-08-01 ENCOUNTER — Encounter: Payer: Self-pay | Admitting: Orthopedic Surgery

## 2020-08-01 NOTE — Progress Notes (Signed)
Office Visit Note   Patient: Caitlyn [name]           Date of Birth: [birth year]           MRN: [id number] Visit Date: 07/24/2020              Requested by: Hulan Fess, MD [place],  [place] [address] PCP: Hulan Fess, MD  Chief Complaint  Patient presents with  . Right Leg - Follow-up  . Left Leg - Follow-up      HPI: Patient is an 86 year old woman status post bilateral lower extremity Profore wraps for venous stasis insufficiency swelling and ulceration.  Assessment & Plan: Visit Diagnoses: No diagnosis found.  Plan: The legs have improved significantly her calf is 33 cm in circumference recommended she start with a size large compression sock.  Follow-Up Instructions: Return in about 2 months (around 09/23/2020).   Ortho Exam  Patient is alert, oriented, no adenopathy, well-dressed, normal affect, normal respiratory effort. Examination she does have brawny skin color changes in her legs there is no open ulcers no cellulitis the skin wrinkles well her calf is 33 cm in circumference.  Imaging: No results found. No images are attached to the encounter.  Labs: No results found for: HGBA1C, ESRSEDRATE, CRP, LABURIC, REPTSTATUS, GRAMSTAIN, CULT, LABORGA   Lab Results  Component Value Date   ALBUMIN 4.7 12/29/2017   ALBUMIN 4.1 11/27/2015   ALBUMIN 4.6 05/23/2014    No results found for: MG No results found for: VD25OH  No results found for: PREALBUMIN CBC EXTENDED Latest Ref Rng & Units 01/27/2019 06/15/2012 08/07/2008  WBC 3.4 - 10.8 x10E3/uL 5.8 6.0 5.9  RBC 3.77 - 5.28 x10E6/uL 3.92 4.26 3.82(L)  HGB 11.1 - 15.9 g/dL 12.5 13.3 12.6  HCT 34.0 - 46.6 % 36.8 39.9 36.5  PLT 150 - 450 x10E3/uL 202 213 224  NEUTROABS - - - 3.5  LYMPHSABS - - - 1.8     Body mass index is 18.71 kg/m.  Orders:  No orders of the defined types were placed in this encounter.  No orders of the defined types were placed in this encounter.     Procedures: No procedures performed  Clinical Data: No additional findings.  ROS:  All other systems negative, except as noted in the HPI. Review of Systems  Objective: Vital Signs: Ht 5\' 4"  (1.626 m)   Wt 109 lb (49.4 kg)   BMI 18.71 kg/m   Specialty Comments:  No specialty comments available.  PMFS History: Patient Active Problem List   Diagnosis Date Noted  . Unilateral primary osteoarthritis, right knee 05/17/2019  . Cervical facet syndrome 04/28/2019  . History of fusion of cervical spine 03/10/2018  . Neck pain 03/02/2018  . Age-related osteoporosis without current pathological fracture 01/01/2018  . Abnormal CXR 05/25/2017  . Hypertensive retinopathy 12/04/2015  . Chest pain with moderate risk for cardiac etiology 10/15/2015  . Scoliosis deformity of spine 10/15/2015  . Hyperlipidemia 10/16/2013  . Essential hypertension 10/16/2013  . Mitral valve prolapse 10/16/2013   Past Medical History:  Diagnosis Date  . AC (acromioclavicular) joint bone spurs 2008   Back  . Anemia   . Arthritis   . Blood transfusion   . Bronchitis   . COPD (chronic obstructive pulmonary disease) ([place])   . GERD (gastroesophageal reflux disease)    uses tums  . Hepatitis    1970's  . Hyperlipidemia   . Hypertension   . Mitral valve prolapse  10/16/2013  . MVP (mitral valve prolapse)    with mild to mod MR and TR 04/2012; followed by Dr. Rollene Fare [facility])  . Osteoporosis   . Sleep apnea    no treatment at this time, had sleep study apprx 8 years ago    Family History  Problem Relation Age of Onset  . Alzheimer's disease Mother   . Alzheimer's disease Father     Past Surgical History:  Procedure Laterality Date  . ABDOMINAL HYSTERECTOMY  1964  . ANTERIOR CERVICAL DECOMP/DISCECTOMY FUSION    . [place]  . HERNIA REPAIR    . INGUINAL HERNIA REPAIR  06/16/2012   Procedure: HERNIA REPAIR INGUINAL ADULT;  Surgeon: Madilyn Hook, DO;  Location: [facility];  Service:  General;  Laterality: Right;  . KNEE ARTHROSCOPY  2007 & 2008  . NM MYOCAR PERF WALL MOTION  05/27/2011   normal  . SPINE SURGERY    . TONGUE BIOPSY  2000  . US ECHOCARDIOGRAPHY  06/07/2012   mild concentric LVH,LA mod. dilated,RA is mild to mod. dilated, mild MVP,prolapse of the posterior leaflet,mild mitral annular ca+,mild to mod MR & TR, AOV mildly sclerotic, aortic root sclerosis/ca+, insignificant pericardial effusion.  Marland Kitchen UVULECTOMY     Social History   Occupational History  . Not on file  Tobacco Use  . Smoking status: Never Smoker  . Smokeless tobacco: Never Used  Substance and Sexual Activity  . Alcohol use: Yes    Comment: occassional  . Drug use: No  . Sexual activity: Not on file

## 2020-08-05 NOTE — Procedures (Signed)
Lumbosacral Transforaminal Epidural Steroid Injection - Sub-Pedicular Approach with Fluoroscopic Guidance  Patient: Caitlyn [name]      Date of Birth: 03/16/1930 MRN: [id number] PCP: Hulan Fess, MD      Visit Date: 07/30/2020   Universal Protocol:    Date/Time: 07/30/2020  Consent Given By: the patient  Position: PRONE  Additional Comments: Vital signs were monitored before and after the procedure. Patient was prepped and draped in the usual sterile fashion. The correct patient, procedure, and site was verified.   Injection Procedure Details:  Procedure Site One Meds Administered:  Meds ordered this encounter  Medications  . methylPREDNISolone acetate (DEPO-MEDROL) injection 80 mg    Laterality: Right  Location/Site:  L4-L5  Needle size: 22 G  Needle type: Spinal  Needle Placement: Transforaminal  Findings:    -Comments: Chest as we entered the foramen without any difficulty the patient experienced some radicular type pain although not sharp and shooting and we did reposition but the patient had just real difficulty even lying in that position.  It should be noted she was very uncomfortable laying in the position even prior to any needling technique at all.  We were able to deliver medication right around the nerve itself but probably not epidural flow.  Procedure Details: After squaring off the end-plates to get a true AP view, the C-arm was positioned so that an oblique view of the foramen as noted above was visualized. The target area is just inferior to the "nose of the scotty dog" or sub pedicular. The soft tissues overlying this structure were infiltrated with 2-3 ml. of 1% Lidocaine without Epinephrine.  The spinal needle was inserted toward the target using a "trajectory" view along the fluoroscope beam.  Under AP and lateral visualization, the needle was advanced so it did not puncture dura and was located close the 6 O'Clock position of the pedical in  AP tracterory. Biplanar projections were used to confirm position. Aspiration was confirmed to be negative for CSF and/or blood. A 1-2 ml. volume of Isovue-250 was injected and flow of contrast was noted at each level. Radiographs were obtained for documentation purposes.   After attaining the desired flow of contrast documented above, a 0.5 to 1.0 ml test dose of 0.25% Marcaine was injected into each respective transforaminal space.  The patient was observed for 90 seconds post injection.  After no sensory deficits were reported, and normal lower extremity motor function was noted,   the above injectate was administered so that equal amounts of the injectate were placed at each foramen (level) into the transforaminal epidural space.   Additional Comments:  Injection was completed around the nerve. Dressing: 2 x 2 sterile gauze and Band-Aid    Post-procedure details: Patient was observed during the procedure. Post-procedure instructions were reviewed.  Patient left the clinic in stable condition.

## 2020-08-05 NOTE — Progress Notes (Signed)
Caitlyn [name] - 86 y.o. female MRN [id number]  Date of birth: 08-Mar-1930  Office Visit Note: Visit Date: 07/30/2020 PCP: Hulan Fess, MD Referred by: Hulan Fess, MD  Subjective: Chief Complaint  Patient presents with  . Lower Back - Pain   HPI: Caitlyn [name] is a 86 y.o. female who comes in today at the request of Dr. Rodell Perna for planned Right L4-5 lumbar epidural steroid injection with fluoroscopic guidance.  The patient has failed conservative care including home exercise, medications, time and activity modification.  This injection will be diagnostic and hopefully therapeutic.  Please see requesting physician notes for further details and justification.  MRI reviewed with images and spine model.  MRI reviewed in the note below.  The last time I saw the patient we did complete a right L4 transforaminal injection really more of an infra neural approach.  She has pretty severe stenosis at this level with prior laminectomy.  She has pretty significant scoliosis deformity.  She has followed up recently with Dr. Rodell Perna.  His note can be reviewed.  She is having right radicular leg pain.    ROS Otherwise per HPI.  Assessment & Plan: Visit Diagnoses:  1. Spinal stenosis of lumbar region with neurogenic claudication     Plan: No additional findings.   Meds & Orders:  Meds ordered this encounter  Medications  . methylPREDNISolone acetate (DEPO-MEDROL) injection 80 mg    Orders Placed This Encounter  Procedures  . XR C-ARM NO REPORT  . Epidural Steroid injection    Follow-up: Return if symptoms worsen or fail to improve, for Could try very specific infra neural approach..   Procedures: No procedures performed  Lumbosacral Transforaminal Epidural Steroid Injection - Sub-Pedicular Approach with Fluoroscopic Guidance  Patient: Caitlyn [name]      Date of Birth: 1930/07/10 MRN: [id number] PCP: Hulan Fess, MD      Visit Date: 07/30/2020     Universal Protocol:    Date/Time: 07/30/2020  Consent Given By: the patient  Position: PRONE  Additional Comments: Vital signs were monitored before and after the procedure. Patient was prepped and draped in the usual sterile fashion. The correct patient, procedure, and site was verified.   Injection Procedure Details:  Procedure Site One Meds Administered:  Meds ordered this encounter  Medications  . methylPREDNISolone acetate (DEPO-MEDROL) injection 80 mg    Laterality: Right  Location/Site:  L4-L5  Needle size: 22 G  Needle type: Spinal  Needle Placement: Transforaminal  Findings:    -Comments: Chest as we entered the foramen without any difficulty the patient experienced some radicular type pain although not sharp and shooting and we did reposition but the patient had just real difficulty even lying in that position.  It should be noted she was very uncomfortable laying in the position even prior to any needling technique at all.  We were able to deliver medication right around the nerve itself but probably not epidural flow.  Procedure Details: After squaring off the end-plates to get a true AP view, the C-arm was positioned so that an oblique view of the foramen as noted above was visualized. The target area is just inferior to the "nose of the scotty dog" or sub pedicular. The soft tissues overlying this structure were infiltrated with 2-3 ml. of 1% Lidocaine without Epinephrine.  The spinal needle was inserted toward the target using a "trajectory" view along the fluoroscope beam.  Under AP and lateral visualization, the needle was advanced  so it did not puncture dura and was located close the 6 O'Clock position of the pedical in AP tracterory. Biplanar projections were used to confirm position. Aspiration was confirmed to be negative for CSF and/or blood. A 1-2 ml. volume of Isovue-250 was injected and flow of contrast was noted at each level. Radiographs were  obtained for documentation purposes.   After attaining the desired flow of contrast documented above, a 0.5 to 1.0 ml test dose of 0.25% Marcaine was injected into each respective transforaminal space.  The patient was observed for 90 seconds post injection.  After no sensory deficits were reported, and normal lower extremity motor function was noted,   the above injectate was administered so that equal amounts of the injectate were placed at each foramen (level) into the transforaminal epidural space.   Additional Comments:  Injection was completed around the nerve. Dressing: 2 x 2 sterile gauze and Band-Aid    Post-procedure details: Patient was observed during the procedure. Post-procedure instructions were reviewed.  Patient left the clinic in stable condition.     Clinical History: MRI LUMBAR SPINE WITHOUT AND WITH CONTRAST  TECHNIQUE: Multiplanar and multiecho pulse sequences of the lumbar spine were obtained without and with intravenous contrast.  CONTRAST:  74mL MULTIHANCE GADOBENATE DIMEGLUMINE 529 MG/ML IV SOLN  COMPARISON:  CT abdomen and pelvis 10/19/2012.  FINDINGS: Segmentation: Normal.  Alignment: 5 mm anterolisthesis L4-5. 3 mm anterolisthesis L3-4. 2 mm retrolisthesis L2-3 and L1-2. Marked scoliosis convex RIGHT estimated 40 degrees.  Vertebrae: Endplate reactive changes above and below L1-2 and L2-3. Old compression fracture L1 with Schmorl's node involving the superior endplate.  Conus medullaris: Normal in size, signal, and location.  Paraspinal tissues: No evidence for hydronephrosis or paravertebral mass.  Disc levels:  L1-L2: 2 mm retrolisthesis is facet mediated. Mild to moderate disc space narrowing. Mild annular bulging. LEFT greater than RIGHT facet arthropathy. LEFT greater than RIGHT subarticular zone narrowing and foraminal zone narrowing.  L2-L3: 2 mm retrolisthesis. Mild to moderate disc space narrowing. Mild annular  bulging mild annular bulging. LEFT greater than RIGHT facet arthropathy. LEFT greater than RIGHT subarticular zone narrowing foraminal zone narrowing. Mild central canal stenosis. LEFT L2 and LEFT L3 nerve root impingement.  L3-L4: Mild to moderate disc space narrowing. 3 mm anterolisthesis. Advanced facet and ligamentum flavum hypertrophy. Severe central canal stenosis. BILATERAL subarticular zone and foraminal zone narrowing affecting the L3 and L4 nerve roots.  L4-L5: Moderate to severe disc space narrowing. 5 mm anterolisthesis. Central protrusion. Moderate facet ligamentum flavum hypertrophy. Prior laminotomies. Severe central canal stenosis. BILATERAL L4 and L5 nerve root impingement.  L5-S1: Moderate disc space narrowing. Shallow central protrusion. BILATERAL facet arthropathy. RIGHT foraminal narrowing affects the L5 nerve root.  No worrisome abnormal postcontrast enhancement. Moderate enhancement in the dorsal soft tissues related to facet disease and postsurgical change, is observed at L3-4 and L4-5.  IMPRESSION: Potentially symptomatic RIGHT-sided neural impingement at L5-S1 (foraminal), L3-4, and L4-5. See comments above.  40 degree scoliosis convex RIGHT mid lumbar region.  Asymmetric subarticular zone and foraminal zone narrowing affects the LEFT-sided nerve roots at L1-2 and L2-3.   Electronically Signed   By: Rolla Flatten M.D.   On: 02/05/2015 14:37   She reports that she has never smoked. She has never used smokeless tobacco. No results for input(s): HGBA1C, LABURIC in the last 8760 hours.  Objective:  VS:  HT:    WT:   BMI:     BP:128/74  HR:83bpm  TEMP: ( )  RESP:  Physical Exam Constitutional:      General: She is not in acute distress.    Appearance: Normal appearance. She is not ill-appearing.  HENT:     Head: Normocephalic and atraumatic.     Right Ear: External ear normal.     Left Ear: External ear normal.  Eyes:     Extraocular  Movements: Extraocular movements intact.  Cardiovascular:     Rate and Rhythm: Normal rate.     Pulses: Normal pulses.  Musculoskeletal:     Right lower leg: No edema.     Left lower leg: No edema.     Comments: Patient has good distal strength with no pain over the greater trochanters.  No clonus or focal weakness.  She has obvious scoliotic deformity of the lumbar spine.  Skin:    Findings: No erythema, lesion or rash.  Neurological:     General: No focal deficit present.     Mental Status: She is alert and oriented to person, place, and time.     Sensory: No sensory deficit.     Motor: No weakness or abnormal muscle tone.     Coordination: Coordination normal.  Psychiatric:        Mood and Affect: Mood normal.        Behavior: Behavior normal.     Ortho Exam  Imaging: No results found.  Past Medical/Family/Surgical/Social History: Medications & Allergies reviewed per EMR, new medications updated. Patient Active Problem List   Diagnosis Date Noted  . Unilateral primary osteoarthritis, right knee 05/17/2019  . Cervical facet syndrome 04/28/2019  . History of fusion of cervical spine 03/10/2018  . Neck pain 03/02/2018  . Age-related osteoporosis without current pathological fracture 01/01/2018  . Abnormal CXR 05/25/2017  . Hypertensive retinopathy 12/04/2015  . Chest pain with moderate risk for cardiac etiology 10/15/2015  . Scoliosis deformity of spine 10/15/2015  . Hyperlipidemia 10/16/2013  . Essential hypertension 10/16/2013  . Mitral valve prolapse 10/16/2013   Past Medical History:  Diagnosis Date  . AC (acromioclavicular) joint bone spurs 2008   Back  . Anemia   . Arthritis   . Blood transfusion   . Bronchitis   . COPD (chronic obstructive pulmonary disease) ([place])   . GERD (gastroesophageal reflux disease)    uses tums  . Hepatitis    1970's  . Hyperlipidemia   . Hypertension   . Mitral valve prolapse 10/16/2013  . MVP (mitral valve prolapse)    with  mild to mod MR and TR 04/2012; followed by Dr. Rollene [facility])  . Osteoporosis   . Sleep apnea    no treatment at this time, had sleep study apprx 8 years ago   Family History  Problem Relation Age of Onset  . Alzheimer's disease Mother   . Alzheimer's disease Father    Past Surgical History:  Procedure Laterality Date  . ABDOMINAL HYSTERECTOMY  1964  . ANTERIOR CERVICAL DECOMP/DISCECTOMY FUSION    . [place]  . HERNIA REPAIR    . INGUINAL HERNIA REPAIR  06/16/2012   Procedure: HERNIA REPAIR INGUINAL ADULT;  Surgeon: Madilyn Hook, DO;  Location: [facility];  Service: General;  Laterality: Right;  . KNEE ARTHROSCOPY  2007 & 2008  . NM MYOCAR PERF WALL MOTION  05/27/2011   normal  . SPINE SURGERY    . TONGUE BIOPSY  2000  . US ECHOCARDIOGRAPHY  06/07/2012   mild concentric LVH,LA mod. dilated,RA is mild to mod. dilated, mild  MVP,prolapse of the posterior leaflet,mild mitral annular ca+,mild to mod MR & TR, AOV mildly sclerotic, aortic root sclerosis/ca+, insignificant pericardial effusion.  Marland Kitchen UVULECTOMY     Social History   Occupational History  . Not on file  Tobacco Use  . Smoking status: Never Smoker  . Smokeless tobacco: Never Used  Substance and Sexual Activity  . Alcohol use: Yes    Comment: occassional  . Drug use: No  . Sexual activity: Not on file

## 2020-08-06 ENCOUNTER — Ambulatory Visit
Admission: RE | Admit: 2020-08-06 | Discharge: 2020-08-06 | Disposition: A | Discharge status: Home / Self Care | Payer: Medicare Other | Source: Ambulatory Visit | Attending: Physician Assistant | Admitting: Physician Assistant

## 2020-08-06 ENCOUNTER — Other Ambulatory Visit: Payer: Self-pay | Admitting: Physician Assistant

## 2020-08-06 DIAGNOSIS — K5641 Fecal impaction: Secondary | ICD-10-CM

## 2020-08-08 ENCOUNTER — Encounter (HOSPITAL_COMMUNITY): Payer: Self-pay

## 2020-08-08 ENCOUNTER — Other Ambulatory Visit: Payer: Self-pay

## 2020-08-08 DIAGNOSIS — Z7982 Long term (current) use of aspirin: Secondary | ICD-10-CM | POA: Diagnosis not present

## 2020-08-08 DIAGNOSIS — I10 Essential (primary) hypertension: Secondary | ICD-10-CM | POA: Insufficient documentation

## 2020-08-08 DIAGNOSIS — K5641 Fecal impaction: Secondary | ICD-10-CM | POA: Diagnosis present

## 2020-08-08 DIAGNOSIS — Z91040 Latex allergy status: Secondary | ICD-10-CM | POA: Diagnosis not present

## 2020-08-08 DIAGNOSIS — Z79899 Other long term (current) drug therapy: Secondary | ICD-10-CM | POA: Insufficient documentation

## 2020-08-08 DIAGNOSIS — J449 Chronic obstructive pulmonary disease, unspecified: Secondary | ICD-10-CM | POA: Diagnosis not present

## 2020-08-08 NOTE — ED Triage Notes (Signed)
Pt presents with c/o need for disimpaction. Pt reports her MD sent her. Pt reports she has been able to pass stool but her MD told her she was still impacted.

## 2020-08-09 ENCOUNTER — Emergency Department (HOSPITAL_COMMUNITY)
Admission: EM | Admit: 2020-08-09 | Discharge: 2020-08-09 | Disposition: A | Discharge status: Home / Self Care | Payer: Medicare Other | Attending: Emergency Medicine | Admitting: Emergency Medicine

## 2020-08-09 DIAGNOSIS — K5641 Fecal impaction: Secondary | ICD-10-CM

## 2020-08-09 MED ORDER — HYDROCORTISONE ACETATE 25 MG RE SUPP
25.0000 mg | Freq: Two times a day (BID) | RECTAL | 0 refills | Status: DC
Start: 2020-08-09 — End: 2022-04-23

## 2020-08-09 MED ORDER — MAGNESIUM CITRATE PO SOLN
1.0000 | Freq: Once | ORAL | Status: AC
  Administered 2020-08-09: 1 via ORAL
  Filled 2020-08-09: qty 296

## 2020-08-09 NOTE — ED Provider Notes (Signed)
[facility] DEPT Provider Note   CSN: [id number] Arrival date & time: 08/08/20  1537   History Chief Complaint  Patient presents with  . Disimpaction    Caitlyn [name] is a 86 y.o. female.  The history is provided by the patient.  She has history of hypertension, hyperlipidemia, COPD and comes in because of fecal impaction.  She has been having problems with constipation and diarrhea for several years and has required disimpaction on several occasions.  Past Medical History:  Diagnosis Date  . AC (acromioclavicular) joint bone spurs 2008   Back  . Anemia   . Arthritis   . Blood transfusion   . Bronchitis   . COPD (chronic obstructive pulmonary disease) ([place])   . GERD (gastroesophageal reflux disease)    uses tums  . Hepatitis    1970's  . Hyperlipidemia   . Hypertension   . Mitral valve prolapse 10/16/2013  . MVP (mitral valve prolapse)    with mild to mod MR and TR 04/2012; followed by Dr. Rollene Fare [facility])  . Osteoporosis   . Sleep apnea    no treatment at this time, had sleep study apprx 8 years ago    Patient Active Problem List   Diagnosis Date Noted  . Unilateral primary osteoarthritis, right knee 05/17/2019  . Cervical facet syndrome 04/28/2019  . History of fusion of cervical spine 03/10/2018  . Neck pain 03/02/2018  . Age-related osteoporosis without current pathological fracture 01/01/2018  . Abnormal CXR 05/25/2017  . Hypertensive retinopathy 12/04/2015  . Chest pain with moderate risk for cardiac etiology 10/15/2015  . Scoliosis deformity of spine 10/15/2015  . Hyperlipidemia 10/16/2013  . Essential hypertension 10/16/2013  . Mitral valve prolapse 10/16/2013    Past Surgical History:  Procedure Laterality Date  . ABDOMINAL HYSTERECTOMY  1964  . ANTERIOR CERVICAL DECOMP/DISCECTOMY FUSION    . [place]  . HERNIA REPAIR    . INGUINAL HERNIA REPAIR  06/16/2012   Procedure: HERNIA REPAIR  INGUINAL ADULT;  Surgeon: Madilyn Hook, DO;  Location: [facility];  Service: General;  Laterality: Right;  . KNEE ARTHROSCOPY  2007 & 2008  . NM MYOCAR PERF WALL MOTION  05/27/2011   normal  . SPINE SURGERY    . TONGUE BIOPSY  2000  . US ECHOCARDIOGRAPHY  06/07/2012   mild concentric LVH,LA mod. dilated,RA is mild to mod. dilated, mild MVP,prolapse of the posterior leaflet,mild mitral annular ca+,mild to mod MR & TR, AOV mildly sclerotic, aortic root sclerosis/ca+, insignificant pericardial effusion.  Marland Kitchen UVULECTOMY       OB History   No obstetric history on file.     Family History  Problem Relation Age of Onset  . Alzheimer's disease Mother   . Alzheimer's disease Father     Social History   Tobacco Use  . Smoking status: Never Smoker  . Smokeless tobacco: Never Used  Substance Use Topics  . Alcohol use: Yes    Comment: occassional  . Drug use: No    Home Medications Prior to Admission medications   Medication Sig Start Date End Date Taking? Authorizing Provider  acetaminophen (TYLENOL) 325 MG tablet Take 650 mg by mouth every 6 (six) hours as needed.    [provider]  antiseptic oral rinse (BIOTENE) LIQD 15 mLs by Mouth Rinse route as needed for dry mouth.    [provider]  aspirin 81 MG tablet Take 81 mg by mouth daily.    [provider]  atorvastatin (LIPITOR) 20 MG tablet TAKE ONE TABLET BY MOUTH DAILY 11/30/19   Croitoru, Mihai, MD  brimonidine-timolol (COMBIGAN) 0.2-0.5 % ophthalmic solution Place 1 drop into the right eye every 12 (twelve) hours.    [provider]  Calcium Carbonate Antacid (TUMS PO) Take 1 tablet by mouth daily.    [provider]  Cholecalciferol (VITAMIN D3) 2000 UNITS TABS Take 1 capsule by mouth daily.     [provider]  Coenzyme Q10 (CO Q 10) 100 MG CAPS Take 100 mg by mouth daily.    [provider]  cycloSPORINE (RESTASIS) 0.05 % ophthalmic emulsion Place 1 drop into both eyes 2  (two) times daily.     [provider]  denosumab (PROLIA) 60 MG/ML SOSY injection Inject 60 mg into the skin every 6 (six) months.    [provider]  hydrochlorothiazide (MICROZIDE) 12.5 MG capsule TAKE ONE CAPSULE BY MOUTH DAILY 12/12/19   Croitoru, Mihai, MD  Ibuprofen-diphenhydrAMINE Cit (ADVIL PM PO) Take 1 tablet by mouth at bedtime.    [provider]  Iron-Vitamins (GERITOL COMPLETE) TABS Take 1 tablet by mouth daily.    [provider]  latanoprost (XALATAN) 0.005 % ophthalmic solution Place 1 drop into the right eye daily. 04/19/14   [provider]  meloxicam (MOBIC) 7.5 MG tablet Take 1 tablet (7.5 mg total) by mouth daily. 06/18/18   Aundra Dubin, PA-C  NASAL SALINE NA Place into the nose as needed.    [provider]  OVER THE COUNTER MEDICATION Take 1 Dose by mouth daily. CBD OIL    [provider]  traMADol (ULTRAM) 50 MG tablet Take 1 tablet (50 mg total) by mouth 2 (two) times daily as needed. 04/27/19   Marybelle Killings, MD    Allergies    Fosamax [alendronate sodium], Latex, and Statins  Review of Systems   Review of Systems  All other systems reviewed and are negative.   Physical Exam Updated Vital Signs BP (!) 154/77   Pulse 79   Temp 98.6 F (37 C) (Oral)   Resp 16   SpO2 99%   Physical Exam Vitals and nursing note reviewed. Exam conducted with a chaperone present.   86 year old female, resting comfortably and in no acute distress. Vital signs are significant for borderline elevated blood pressure. Oxygen saturation is 96%, which is normal. Head is normocephalic and atraumatic. PERRLA, EOMI. Oropharynx is clear. Neck is nontender and supple without adenopathy or JVD. Back is nontender and there is no CVA tenderness. Lungs are clear without rales, wheezes, or rhonchi. Chest is nontender. Heart has regular rate and rhythm without murmur. Abdomen is soft, flat, nontender without masses or  hepatosplenomegaly and peristalsis is normoactive. Rectal: Normal sphincter tone, soft fecal impaction is present but to high up in the rectal vault for manual disimpaction.  Stool is light brown.  No masses felt. Extremities have no cyanosis or edema, full range of motion is present. Skin is warm and dry without rash. Neurologic: Mental status is normal, cranial nerves are intact, there are no motor or sensory deficits.  ED Results / Procedures / Treatments    Procedures Procedures  Medications Ordered in ED Medications  magnesium citrate solution 1 Bottle (has no administration in time range)    ED Course  I have reviewed the triage vital signs and the nursing notes.  MDM Rules/Calculators/A&P Soft fecal impaction.  Unfortunately, the stool is too high up  in the rectal vault for manual disimpaction.  We will try soapsuds enema.  Old records are reviewed, confirming prior ED visit for fecal impaction.  He had moderate results from soapsuds enema.  She is given a second soapsuds enema with better results.  She is given a dose of magnesium citrate and is discharged with instructions to follow-up with PCP.  She will need to establish a good bowel regimen.  She states that her hemorrhoids sometimes give her problems with passing her stool, given a prescription for Anusol HC suppositories. Final Clinical Impression(s) / ED Diagnoses Final diagnoses:  Fecal impaction in rectum [facility])    Rx / DC Orders ED Discharge Orders         Ordered    hydrocortisone (ANUSOL-HC) 25 MG suppository  2 times daily        08/09/20 9604           Delora Fuel, MD 54/09/81 [phone]

## 2020-08-09 NOTE — ED Notes (Signed)
Pt remains on commode at bedside. Sts "I'm getting some results but it's slow". NAD. VSS. Cont to monitor.

## 2020-08-09 NOTE — ED Notes (Signed)
Enters my care. Pt on commode at this time. NAD. Cont to monitor.

## 2020-08-09 NOTE — ED Notes (Signed)
ED Provider at bedside. 

## 2020-08-09 NOTE — ED Notes (Addendum)
Patient ambulatory to the room with c/o diarrhea.

## 2020-08-09 NOTE — ED Notes (Signed)
Pt had some liquid stool and large formed brown stool in commode at bedside. Pt sts some relief noted. Denies abd pain, n/v/d. Denies dizziness or chest pain. Skin w/d/pink. Resp wnl, equal and non-labored. A/Ox4. NAD. No complaints voiced.

## 2020-08-15 ENCOUNTER — Ambulatory Visit
Admission: RE | Admit: 2020-08-15 | Discharge: 2020-08-15 | Disposition: A | Discharge status: Home / Self Care | Payer: Medicare Other | Source: Ambulatory Visit | Attending: Physician Assistant | Admitting: Physician Assistant

## 2020-08-15 ENCOUNTER — Other Ambulatory Visit: Payer: Self-pay | Admitting: Physician Assistant

## 2020-08-15 DIAGNOSIS — K5641 Fecal impaction: Secondary | ICD-10-CM

## 2020-09-13 ENCOUNTER — Other Ambulatory Visit: Payer: Self-pay

## 2020-09-13 ENCOUNTER — Encounter (INDEPENDENT_AMBULATORY_CARE_PROVIDER_SITE_OTHER): Payer: Medicare Other | Admitting: Ophthalmology

## 2020-09-13 DIAGNOSIS — H35342 Macular cyst, hole, or pseudohole, left eye: Secondary | ICD-10-CM | POA: Diagnosis not present

## 2020-09-13 DIAGNOSIS — I10 Essential (primary) hypertension: Secondary | ICD-10-CM | POA: Diagnosis not present

## 2020-09-13 DIAGNOSIS — H35033 Hypertensive retinopathy, bilateral: Secondary | ICD-10-CM

## 2020-09-13 DIAGNOSIS — H43813 Vitreous degeneration, bilateral: Secondary | ICD-10-CM

## 2020-09-13 DIAGNOSIS — H348312 Tributary (branch) retinal vein occlusion, right eye, stable: Secondary | ICD-10-CM | POA: Diagnosis not present

## 2020-09-24 ENCOUNTER — Ambulatory Visit: Payer: Medicare Other | Admitting: Physician Assistant

## 2020-09-26 ENCOUNTER — Other Ambulatory Visit: Payer: Self-pay | Admitting: Cardiovascular Disease

## 2020-10-19 ENCOUNTER — Other Ambulatory Visit: Payer: Self-pay

## 2020-10-19 ENCOUNTER — Ambulatory Visit: Payer: Medicare Other | Admitting: Cardiovascular Disease

## 2020-10-19 ENCOUNTER — Encounter: Payer: Self-pay | Admitting: Cardiovascular Disease

## 2020-10-19 VITALS — BP 144/74 | HR 86 | Ht 64.5 in | Wt 114.8 lb

## 2020-10-19 DIAGNOSIS — I10 Essential (primary) hypertension: Secondary | ICD-10-CM | POA: Diagnosis not present

## 2020-10-19 DIAGNOSIS — I341 Nonrheumatic mitral (valve) prolapse: Secondary | ICD-10-CM

## 2020-10-19 DIAGNOSIS — E7800 Pure hypercholesterolemia, unspecified: Secondary | ICD-10-CM

## 2020-10-19 NOTE — Progress Notes (Signed)
Patient ID: Caitlyn [name], female   DOB: 08-Sep-1930, 86 y.o.   MRN: [id number]    Cardiology Office Note    Date:  10/19/2020   ID:  Caitlyn, [name] [birth year], MRN [id number]  PCP:  Caitlyn Fess, MD  Cardiologist:   Sanda Klein, MD   Chief Complaint  Patient presents with  . Mitral Valve Prolapse    History of Present Illness:  Caitlyn [name] is a 86 y.o. female with long-standing systemic hypertension and hyperlipidemia and mild mitral valve prolapse returning for follow-up.   He has not had any cardiovascular problems in the last year, but she continues to have serious issues related to her spine and GI tract.  Her scoliosis has progressed and there are no surgical options.  She has severe dextroscoliosis of the thoracic spine.  40 pounds since last year due to problems with alternating constipation and diarrhea.  She has not been troubled by palpitations except when she is extremely emotional when she has to strain very hard in the bathroom.  She denies shortness of breath, but complains of being "unable to fully take a breath).  She does not have exertional dyspnea, orthopnea, PND.  She has minimal ankle swelling at the end of the day.  She denies dizziness, syncope or edema claudication.  Her blood pressure is well controlled.  She has excellent LDL cholesterol on statin therapy.  She has no known history of coronary disease with a normal nuclear stress test in 2012 and no history of previous cardiac catheterization.In the past she has had side effects with certain statins (simvastatin, Crestor) but she seems to tolerate atorvastatin reasonably well.   Past Medical History:  Diagnosis Date  . AC (acromioclavicular) joint bone spurs 2008   Back  . Anemia   . Arthritis   . Blood transfusion   . Bronchitis   . COPD (chronic obstructive pulmonary disease) ([place])   . GERD (gastroesophageal reflux disease)    uses tums  . Hepatitis    1970's  .  Hyperlipidemia   . Hypertension   . Mitral valve prolapse 10/16/2013  . MVP (mitral valve prolapse)    with mild to mod MR and TR 04/2012; followed by Dr. Rollene F[facility])  . Osteoporosis   . Sleep apnea    no treatment at this time, had sleep study apprx 8 years ago    Past Surgical History:  Procedure Laterality Date  . ABDOMINAL HYSTERECTOMY  1964  . ANTERIOR CERVICAL DECOMP/DISCECTOMY FUSION    . [place]  . HERNIA REPAIR    . INGUINAL HERNIA REPAIR  06/16/2012   Procedure: HERNIA REPAIR INGUINAL ADULT;  Surgeon: Madilyn Hook, DO;  Location: [place];  Service: General;  Laterality: Right;  . KNEE ARTHROSCOPY  2007 & 2008  . NM MYOCAR PERF WALL MOTION  05/27/2011   normal  . SPINE SURGERY    . TONGUE BIOPSY  2000  . US ECHOCARDIOGRAPHY  06/07/2012   mild concentric LVH,LA mod. dilated,RA is mild to mod. dilated, mild MVP,prolapse of the posterior leaflet,mild mitral annular ca+,mild to mod MR & TR, AOV mildly sclerotic, aortic root sclerosis/ca+, insignificant pericardial effusion.  Marland Kitchen UVULECTOMY      Outpatient Medications Prior to Visit  Medication Sig Dispense Refill  . acetaminophen (TYLENOL) 325 MG tablet Take 650 mg by mouth every 6 (six) hours as needed.    Marland Kitchen antiseptic oral rinse (BIOTENE) LIQD 15 mLs by Mouth Rinse route as needed  for dry mouth.    Marland Kitchen aspirin 81 MG tablet Take 81 mg by mouth daily.    Marland Kitchen atorvastatin (LIPITOR) 20 MG tablet TAKE ONE TABLET BY MOUTH DAILY 90 tablet 2  . brimonidine-timolol (COMBIGAN) 0.2-0.5 % ophthalmic solution Place 1 drop into the right eye every 12 (twelve) hours.    . Calcium Carbonate Antacid (TUMS PO) Take 1 tablet by mouth daily.    . Cholecalciferol (VITAMIN D3) 2000 UNITS TABS Take 1 capsule by mouth daily.     . Coenzyme Q10 (CO Q 10) 100 MG CAPS Take 100 mg by mouth daily.    . cycloSPORINE (RESTASIS) 0.05 % ophthalmic emulsion Place 1 drop into both eyes 2 (two) times daily.     Marland Kitchen denosumab (PROLIA) 60 MG/ML  SOSY injection Inject 60 mg into the skin every 6 (six) months.    . hydrochlorothiazide (MICROZIDE) 12.5 MG capsule TAKE ONE CAPSULE BY MOUTH DAILY 90 capsule 2  . hydrocortisone (ANUSOL-HC) 25 MG suppository Place 1 suppository (25 mg total) rectally 2 (two) times daily. For 7 days 14 suppository 0  . Ibuprofen-diphenhydrAMINE Cit (ADVIL PM PO) Take 1 tablet by mouth at bedtime.    . Iron-Vitamins (GERITOL COMPLETE) TABS Take 1 tablet by mouth daily.    Marland Kitchen latanoprost (XALATAN) 0.005 % ophthalmic solution Place 1 drop into the right eye daily.    . meloxicam (MOBIC) 7.5 MG tablet Take 1 tablet (7.5 mg total) by mouth daily. 30 tablet 0  . NASAL SALINE NA Place into the nose as needed.    Marland Kitchen OVER THE COUNTER MEDICATION Take 1 Dose by mouth daily. CBD OIL    . traMADol (ULTRAM) 50 MG tablet Take 1 tablet (50 mg total) by mouth 2 (two) times daily as needed. 30 tablet 0   No facility-administered medications prior to visit.     Allergies:   Fosamax [alendronate sodium], Latex, and Statins   Social History   Socioeconomic History  . Marital status: Married    Spouse name: Not on file  . Number of children: Not on file  . Years of education: Not on file  . Highest education level: Not on file  Occupational History  . Not on file  Tobacco Use  . Smoking status: Never Smoker  . Smokeless tobacco: Never Used  Substance and Sexual Activity  . Alcohol use: Yes    Comment: occassional  . Drug use: No  . Sexual activity: Not on file  Other Topics Concern  . Not on file  Social History Narrative  . Not on file   Social Determinants of Health   Financial Resource Strain: Not on file  Food Insecurity: Not on file  Transportation Needs: Not on file  Physical Activity: Not on file  Stress: Not on file  Social Connections: Not on file     Family History:  The patient's   family history includes Alzheimer's disease in her father and mother.   ROS:   Please see the history of present  illness.    ROS All other systems are reviewed and are negative.   PHYSICAL EXAM:   VS:  BP (!) 144/74   Pulse 86   Ht 5' 4.5" (1.638 m)   Wt 114 lb 12.8 oz (52.1 kg)   BMI 19.40 kg/m    Prominent thoracic dextroscoliosis  General: Alert, oriented x3, no distress, appears underweight Head: no evidence of trauma, PERRL, EOMI, no exophtalmos or lid lag, no myxedema, no xanthelasma; normal ears,  nose and oropharynx Neck: normal jugular venous pulsations and no hepatojugular reflux; brisk carotid pulses without delay and no carotid bruits Chest: clear to auscultation, no signs of consolidation by percussion or palpation, normal fremitus, symmetrical and full respiratory excursions Cardiovascular: normal position and quality of the apical impulse, regular rhythm, normal first and second heart sounds, there is a distinct midsystolic click, but there are no murmurs, rubs or gallops.  The murmur is not heard even after the Valsalva maneuver. Abdomen: no tenderness or distention, no masses by palpation, no abnormal pulsatility or arterial bruits, normal bowel sounds, no hepatosplenomegaly Extremities: no clubbing, cyanosis or edema; 2+ radial, ulnar and brachial pulses bilaterally; 2+ right femoral, posterior tibial and dorsalis pedis pulses; 2+ left femoral, posterior tibial and dorsalis pedis pulses; no subclavian or femoral bruits Neurological: grossly nonfocal Psych: Normal mood and affect   Wt Readings from Last 3 Encounters:  10/19/20 114 lb 12.8 oz (52.1 kg)  07/24/20 109 lb (49.4 kg)  07/17/20 109 lb (49.4 kg)      Studies/Labs Reviewed:   EKG:  EKG is ordered today.  It was normal sinus rhythm with a QS pattern in V1 and a diminutive R wave in, a couple of PACs are seen but is otherwise a normal tracing.  Is not meaningfully changed from previous ECGs.   Recent Labs: No results found for requested labs within last 8760 hours.   Lipid Panel    Component Value Date/Time   CHOL  162 12/29/2017 1100   TRIG 60 12/29/2017 1100   HDL 95 12/29/2017 1100   CHOLHDL 1.7 12/29/2017 1100   CHOLHDL 1.8 11/27/2015 1125   VLDL 13 11/27/2015 1125   LDLCALC 55 12/29/2017 1100   09/12/2019 Total cholesterol 163, HDL 84, LDL 67, triglycerides 61 Hemoglobin 12.8, creatinine 0.67, potassium 4.4, TSH 2.51  09/25/2020 Total cholesterol 165, HDL 86, LDL 66, triglycerides 60 Hemoglobin 11.7, creatinine 0.58, potassium 4.7, normal liver function tests, TSH 3.37  ASSESSMENT:    1. Essential hypertension   2. Hypercholesterolemia   3. Mitral valve prolapse      PLAN:  In order of problems listed above:  1. HTN: Very well controlled on hydrochlorothiazide monotherapy. 2. HLP: Excellent lipid profile. 3. MVP: Remains asymptomatic.  No murmur heard on exam although there is a distinct midsystolic click noted.  Has some PACs, but these are asymptomatic. Dilated left atrium, normal left ventricular size and function by echo.      Medication Adjustments/Labs and Tests Ordered: Current medicines are reviewed at length with the patient today.  Concerns regarding medicines are outlined above.  Medication changes, Labs and Tests ordered today are listed in the Patient Instructions below. Patient Instructions  Medication Instructions:  No changes *If you need a refill on your cardiac medications before your next appointment, please call your pharmacy*   Lab Work: None ordered If you have labs (blood work) drawn today and your tests are completely normal, you will receive your results only by: Marland Kitchen MyChart Message (if you have MyChart) OR . A paper copy in the mail If you have any lab test that is abnormal or we need to change your treatment, we will call you to review the results.   Testing/Procedures: None ordered   Follow-Up: At [facility], you and your health needs are our priority.  As part of our continuing mission to provide you with exceptional heart care, we have  created designated Provider Care Teams.  These Care Teams include your primary  Cardiologist (physician) and Advanced Practice Providers (APPs -  Physician Assistants and Nurse Practitioners) who all work together to provide you with the care you need, when you need it.  We recommend signing up for the patient portal called "MyChart".  Sign up information is provided on this After Visit Summary.  MyChart is used to connect with patients for Virtual Visits (Telemedicine).  Patients are able to view lab/test results, encounter notes, upcoming appointments, etc.  Non-urgent messages can be sent to your provider as well.   To learn more about what you can do with MyChart, go to [email or web address].    Your next appointment:   12 month(s)  The format for your next appointment:   In Person  Provider:   You may see Sanda Klein, MD or one of the following Advanced Practice Providers on your designated Care Team:    Almyra Deforest, PA-C  Fabian Sharp, [place] or   Roby Lofts, PA-C        Signed, Sanda Klein, MD  10/19/2020 9:58 AM    [facility] [place], [place], [place]  [address] Phone: [phone]; Fax: [phone]

## 2020-10-19 NOTE — Patient Instructions (Signed)

## 2020-10-24 ENCOUNTER — Other Ambulatory Visit: Payer: Self-pay | Admitting: Physician Assistant

## 2020-10-24 ENCOUNTER — Ambulatory Visit
Admission: RE | Admit: 2020-10-24 | Discharge: 2020-10-24 | Disposition: A | Discharge status: Home / Self Care | Payer: Medicare Other | Source: Ambulatory Visit | Attending: Physician Assistant | Admitting: Physician Assistant

## 2020-10-24 DIAGNOSIS — K5901 Slow transit constipation: Secondary | ICD-10-CM

## 2020-10-26 ENCOUNTER — Telehealth: Payer: Self-pay | Admitting: Orthopaedic Surgery

## 2020-10-26 NOTE — Telephone Encounter (Signed)
Please see below. Patient requests phone call from you. 

## 2020-10-26 NOTE — Telephone Encounter (Signed)
Pt called asking for a CB from Dr. Lorin Mercy she states she is having some trouble with another Dr and its urgent that she speak with Dr. Lorin Mercy.  [phone]

## 2020-10-26 NOTE — Telephone Encounter (Signed)
I called. Needs to self disimpact. She also has enema

## 2020-11-13 ENCOUNTER — Ambulatory Visit: Payer: Medicare Other | Admitting: Orthopaedic Surgery

## 2020-11-13 ENCOUNTER — Encounter: Payer: Self-pay | Admitting: Orthopaedic Surgery

## 2020-11-13 VITALS — Ht 64.5 in | Wt 115.0 lb

## 2020-11-13 DIAGNOSIS — M48061 Spinal stenosis, lumbar region without neurogenic claudication: Secondary | ICD-10-CM

## 2020-11-13 DIAGNOSIS — M4126 Other idiopathic scoliosis, lumbar region: Secondary | ICD-10-CM

## 2020-11-13 NOTE — Progress Notes (Signed)
Office Visit Note   Patient: Caitlyn [name]           Date of Birth: 01-12-1930           MRN: [id number] Visit Date: 11/13/2020              Requested by: Catha Gosselin, MD [address] [place],  [place] [address] PCP: Catha Gosselin, MD   Assessment & Plan: Visit Diagnoses:  1. Other idiopathic scoliosis, lumbar region   2. Lumbar foraminal stenosis     Plan: She can use some ibuprofen intermittently for symptoms.  Walking stretching program.  We discussed correcting her curvature and fixing the foraminal stenosis to be a significant undertaking multilevel.  She will work on the impaction problem which we have discussed multiple times in the past.  Follow-Up Instructions: No follow-ups on file.   Orders:  No orders of the defined types were placed in this encounter.  No orders of the defined types were placed in this encounter.     Procedures: No procedures performed   Clinical Data: No additional findings.   Subjective: Chief Complaint  Patient presents with  . Lower Back - Pain  . Right Leg - Pain    HPI 87 year old female returns with 3-week history of right lumbar pain that radiates around to the lateral hip.  Difficulty crossing her legs pain radiates down toward her knees problems with driving.  No bowel or bladder symptoms other than the chronic problems with recurrent impaction problems with multiple x-rays and CT scan also showing large amount of colonic stool in the ball.  Patient denies any numbness or tingling below her knees.  She gets some relief of her back pain with Advil.  Previous MRI showed significant foraminal stenosis particularly L3-4 L4-5 with her right lumbar curvature.  Review of Systems Previous cervical fusion.  Scoliosis foraminal stenosis severe with stable foraminal stenosis L3-4 L4-5 by MRI scan.  Mitral valve prolapse chronic constipation all other systems are noncontributory to HPI.  Objective: Vital Signs: Ht 5' 4.5"  (1.638 m)   Wt 115 lb (52.2 kg)   BMI 19.43 kg/m   Physical Exam Constitutional:      Appearance: She is well-developed.  HENT:     Head: Normocephalic.     Right Ear: External ear normal.     Left Ear: External ear normal.  Eyes:     Pupils: Pupils are equal, round, and reactive to light.  Neck:     Thyroid: No thyromegaly.     Trachea: No tracheal deviation.  Cardiovascular:     Rate and Rhythm: Normal rate.  Pulmonary:     Effort: Pulmonary effort is normal.  Abdominal:     Palpations: Abdomen is soft.  Skin:    General: Skin is warm and dry.  Neurological:     Mental Status: She is alert and oriented to person, place, and time.  Psychiatric:        Mood and Affect: Mood and affect normal.        Behavior: Behavior normal.     Ortho Exam patient ambulates heel toe gait.  Short wasted.  Lumbar curvature right right with minimal pelvic obliquity.  Negative logroll the hips but she is not able to figure 4.  Discomfort over the buttocks and trochanter region with crossing right leg over the left.  Distal pulse palpable.  Specialty Comments:  No specialty comments available.  Imaging: No results found.   PMFS History: Patient  Active Problem List   Diagnosis Date Noted  . Lumbar foraminal stenosis 11/13/2020  . Unilateral primary osteoarthritis, right knee 05/17/2019  . Cervical facet syndrome 04/28/2019  . History of fusion of cervical spine 03/10/2018  . Neck pain 03/02/2018  . Age-related osteoporosis without current pathological fracture 01/01/2018  . Abnormal CXR 05/25/2017  . Hypertensive retinopathy 12/04/2015  . Chest pain with moderate risk for cardiac etiology 10/15/2015  . Scoliosis deformity of spine 10/15/2015  . Hyperlipidemia 10/16/2013  . Essential hypertension 10/16/2013  . Mitral valve prolapse 10/16/2013   Past Medical History:  Diagnosis Date  . AC (acromioclavicular) joint bone spurs 2008   Back  . Anemia   . Arthritis   . Blood  transfusion   . Bronchitis   . COPD (chronic obstructive pulmonary disease) ([employer])   . GERD (gastroesophageal reflux disease)    uses tums  . Hepatitis    1970's  . Hyperlipidemia   . Hypertension   . Mitral valve prolapse 10/16/2013  . MVP (mitral valve prolapse)    with mild to mod MR and TR 04/2012; followed by Dr. Rollene Fare [facility])  . Osteoporosis   . Sleep apnea    no treatment at this time, had sleep study apprx 8 years ago    Family History  Problem Relation Age of Onset  . Alzheimer's disease Mother   . Alzheimer's disease Father     Past Surgical History:  Procedure Laterality Date  . ABDOMINAL HYSTERECTOMY  1964  . ANTERIOR CERVICAL DECOMP/DISCECTOMY FUSION    . [place]  . HERNIA REPAIR    . INGUINAL HERNIA REPAIR  06/16/2012   Procedure: HERNIA REPAIR INGUINAL ADULT;  Surgeon: Madilyn Hook, DO;  Location: [place];  Service: General;  Laterality: Right;  . KNEE ARTHROSCOPY  2007 & 2008  . NM MYOCAR PERF WALL MOTION  05/27/2011   normal  . SPINE SURGERY    . TONGUE BIOPSY  2000  . US ECHOCARDIOGRAPHY  06/07/2012   mild concentric LVH,LA mod. dilated,RA is mild to mod. dilated, mild MVP,prolapse of the posterior leaflet,mild mitral annular ca+,mild to mod MR & TR, AOV mildly sclerotic, aortic root sclerosis/ca+, insignificant pericardial effusion.  Marland Kitchen UVULECTOMY     Social History   Occupational History  . Not on file  Tobacco Use  . Smoking status: Never Smoker  . Smokeless tobacco: Never Used  Substance and Sexual Activity  . Alcohol use: Yes    Comment: occassional  . Drug use: No  . Sexual activity: Not on file

## 2021-01-18 DIAGNOSIS — K219 Gastro-esophageal reflux disease without esophagitis: Secondary | ICD-10-CM | POA: Diagnosis not present

## 2021-02-06 ENCOUNTER — Ambulatory Visit: Payer: Medicare Other | Admitting: Orthopaedic Surgery

## 2021-02-08 DIAGNOSIS — K639 Disease of intestine, unspecified: Secondary | ICD-10-CM | POA: Diagnosis not present

## 2021-02-08 DIAGNOSIS — K5901 Slow transit constipation: Secondary | ICD-10-CM | POA: Diagnosis not present

## 2021-02-08 DIAGNOSIS — R197 Diarrhea, unspecified: Secondary | ICD-10-CM | POA: Diagnosis not present

## 2021-02-14 DIAGNOSIS — D649 Anemia, unspecified: Secondary | ICD-10-CM | POA: Diagnosis not present

## 2021-02-14 DIAGNOSIS — K5900 Constipation, unspecified: Secondary | ICD-10-CM | POA: Diagnosis not present

## 2021-02-14 DIAGNOSIS — H9193 Unspecified hearing loss, bilateral: Secondary | ICD-10-CM | POA: Diagnosis not present

## 2021-02-19 ENCOUNTER — Ambulatory Visit
Admission: RE | Admit: 2021-02-19 | Discharge: 2021-02-19 | Disposition: A | Discharge status: Home / Self Care | Payer: Medicare Other | Source: Ambulatory Visit | Attending: Physician Assistant | Admitting: Physician Assistant

## 2021-02-19 ENCOUNTER — Other Ambulatory Visit: Payer: Self-pay | Admitting: Physician Assistant

## 2021-02-19 DIAGNOSIS — R109 Unspecified abdominal pain: Secondary | ICD-10-CM | POA: Diagnosis not present

## 2021-02-19 DIAGNOSIS — M160 Bilateral primary osteoarthritis of hip: Secondary | ICD-10-CM | POA: Diagnosis not present

## 2021-02-19 DIAGNOSIS — R1084 Generalized abdominal pain: Secondary | ICD-10-CM

## 2021-02-19 DIAGNOSIS — R197 Diarrhea, unspecified: Secondary | ICD-10-CM | POA: Diagnosis not present

## 2021-02-19 DIAGNOSIS — K6289 Other specified diseases of anus and rectum: Secondary | ICD-10-CM | POA: Diagnosis not present

## 2021-03-02 IMAGING — DX DG ABDOMEN 1V
1 series · 1 of 1 positions shown · non-contrast
Comparison: August 15, 2020

CLINICAL DATA: Constipation and abdominal pain

EXAM:
ABDOMEN - 1 VIEW

[dg abd 1 view]
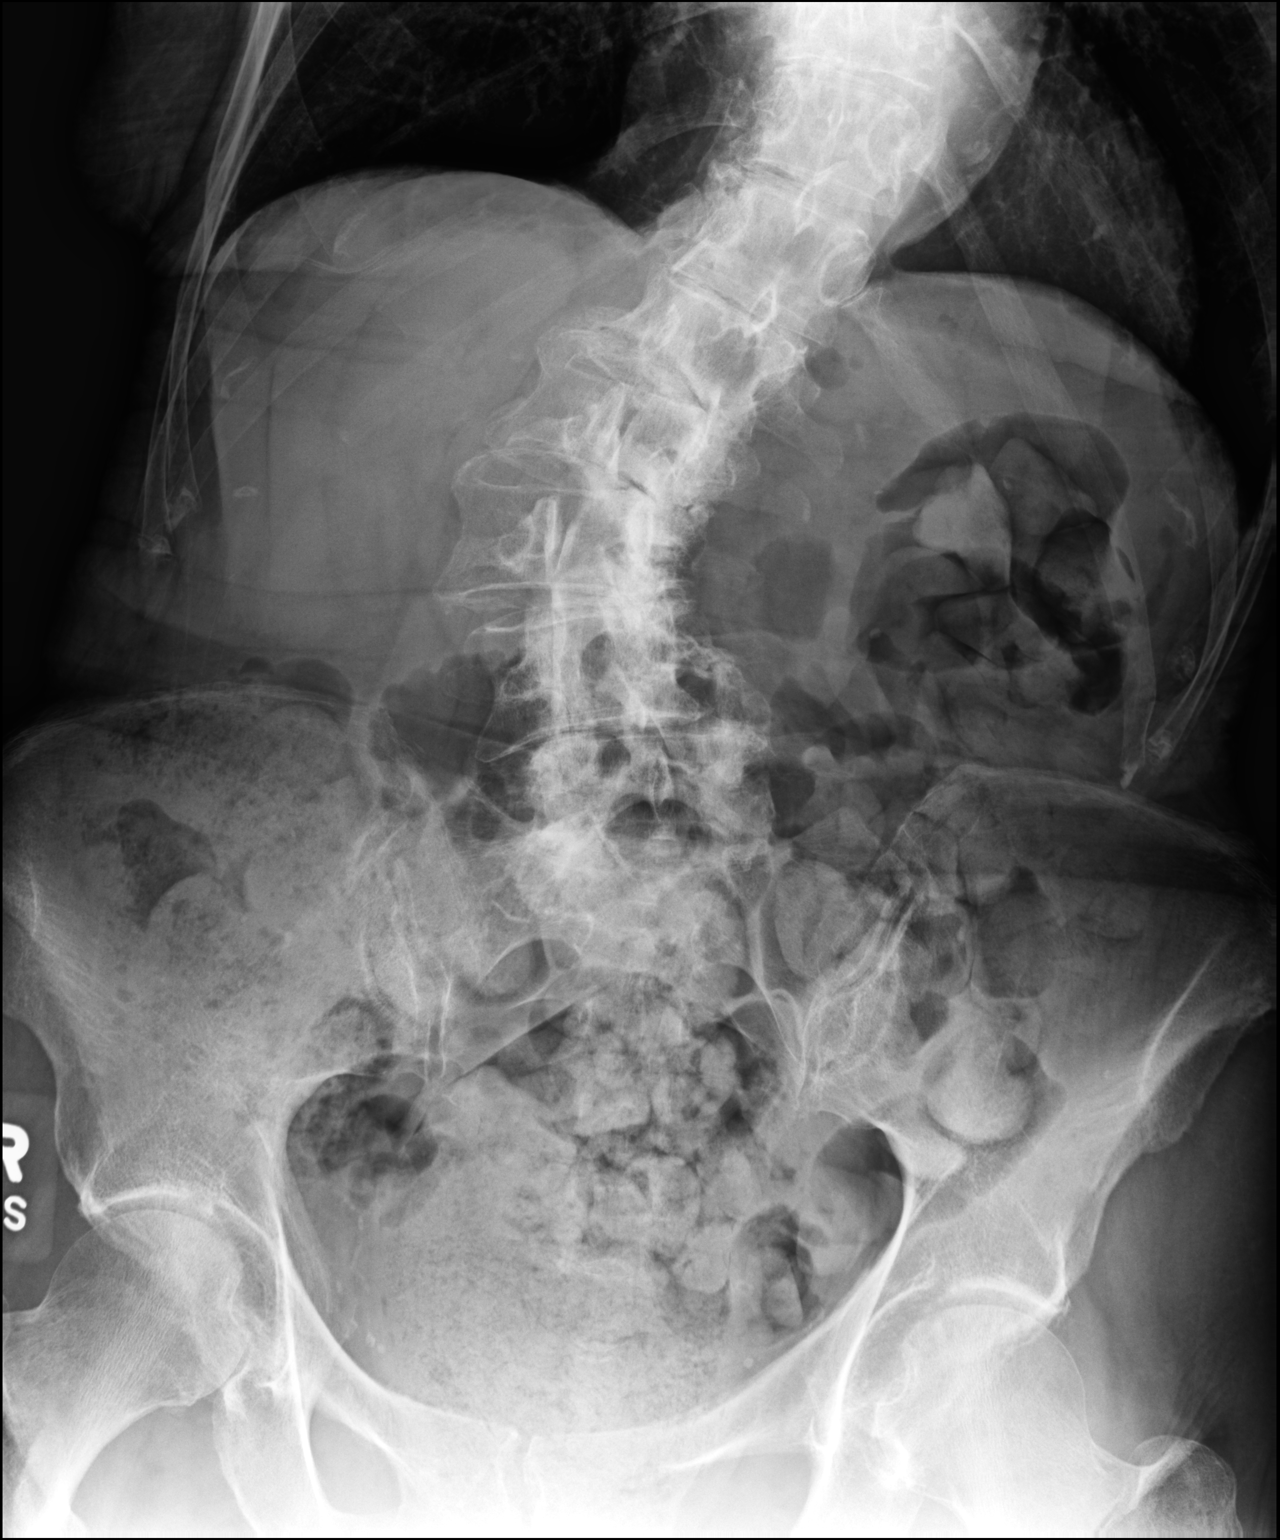

[1 of 1 positions shown; findings below may reference images not displayed]

FINDINGS: There is extensive stool throughout colon. Rectum is distended with
stool. There is no bowel dilatation or air-fluid level to suggest
bowel obstruction. No free air. Lung bases clear. There is marked
lumbar dextroscoliosis with rotatory component.
IMPRESSION: Extensive stool throughout colon. Rectum distended with stool.
Appearance consistent with constipation. No frank bowel obstruction
or free air evident. Lung bases clear.

## 2021-04-16 ENCOUNTER — Telehealth: Payer: Self-pay | Admitting: Orthopaedic Surgery

## 2021-04-16 NOTE — Telephone Encounter (Signed)
Please advise 

## 2021-04-16 NOTE — Telephone Encounter (Signed)
noted 

## 2021-04-16 NOTE — Telephone Encounter (Signed)
Pt wonder if the Prolia (that's how she said to spell it) injection that she has been getting from [facility] is worth it. She was having a foundation help her pay for it but now they're no longer doing it so she was have to pay like 1250 for it. She wanted to know what D.Yates recommends. She would like someone to call her asap.  CB [phone]

## 2021-04-16 NOTE — Telephone Encounter (Signed)
I called. Discussed. She was paying 259 before now $1250 2X per year.

## 2021-04-22 DIAGNOSIS — M419 Scoliosis, unspecified: Secondary | ICD-10-CM | POA: Diagnosis not present

## 2021-04-22 DIAGNOSIS — K5901 Slow transit constipation: Secondary | ICD-10-CM | POA: Diagnosis not present

## 2021-04-22 DIAGNOSIS — R197 Diarrhea, unspecified: Secondary | ICD-10-CM | POA: Diagnosis not present

## 2021-04-22 DIAGNOSIS — M810 Age-related osteoporosis without current pathological fracture: Secondary | ICD-10-CM | POA: Diagnosis not present

## 2021-04-22 DIAGNOSIS — E559 Vitamin D deficiency, unspecified: Secondary | ICD-10-CM | POA: Diagnosis not present

## 2021-04-22 DIAGNOSIS — R5383 Other fatigue: Secondary | ICD-10-CM | POA: Diagnosis not present

## 2021-04-24 DIAGNOSIS — M810 Age-related osteoporosis without current pathological fracture: Secondary | ICD-10-CM | POA: Diagnosis not present

## 2021-05-22 DIAGNOSIS — K5901 Slow transit constipation: Secondary | ICD-10-CM | POA: Diagnosis not present

## 2021-05-23 DIAGNOSIS — L570 Actinic keratosis: Secondary | ICD-10-CM | POA: Diagnosis not present

## 2021-05-23 DIAGNOSIS — L820 Inflamed seborrheic keratosis: Secondary | ICD-10-CM | POA: Diagnosis not present

## 2021-05-28 DIAGNOSIS — H348312 Tributary (branch) retinal vein occlusion, right eye, stable: Secondary | ICD-10-CM | POA: Diagnosis not present

## 2021-05-28 DIAGNOSIS — H43812 Vitreous degeneration, left eye: Secondary | ICD-10-CM | POA: Diagnosis not present

## 2021-05-28 DIAGNOSIS — H35342 Macular cyst, hole, or pseudohole, left eye: Secondary | ICD-10-CM | POA: Diagnosis not present

## 2021-05-28 DIAGNOSIS — H35033 Hypertensive retinopathy, bilateral: Secondary | ICD-10-CM | POA: Diagnosis not present

## 2021-06-17 DIAGNOSIS — E46 Unspecified protein-calorie malnutrition: Secondary | ICD-10-CM | POA: Diagnosis not present

## 2021-06-17 DIAGNOSIS — H9193 Unspecified hearing loss, bilateral: Secondary | ICD-10-CM | POA: Diagnosis not present

## 2021-06-23 ENCOUNTER — Other Ambulatory Visit: Payer: Self-pay | Admitting: Cardiovascular Disease

## 2021-07-01 DIAGNOSIS — H903 Sensorineural hearing loss, bilateral: Secondary | ICD-10-CM | POA: Diagnosis not present

## 2021-08-13 ENCOUNTER — Other Ambulatory Visit: Payer: Self-pay

## 2021-08-13 ENCOUNTER — Ambulatory Visit: Payer: Self-pay

## 2021-08-13 ENCOUNTER — Encounter: Payer: Self-pay | Admitting: Orthopaedic Surgery

## 2021-08-13 ENCOUNTER — Ambulatory Visit: Payer: Medicare Other | Admitting: Orthopaedic Surgery

## 2021-08-13 VITALS — BP 167/80 | HR 80 | Ht 64.0 in | Wt 110.0 lb

## 2021-08-13 DIAGNOSIS — G8929 Other chronic pain: Secondary | ICD-10-CM

## 2021-08-13 DIAGNOSIS — M542 Cervicalgia: Secondary | ICD-10-CM | POA: Diagnosis not present

## 2021-08-13 DIAGNOSIS — M47812 Spondylosis without myelopathy or radiculopathy, cervical region: Secondary | ICD-10-CM

## 2021-08-13 DIAGNOSIS — M25562 Pain in left knee: Secondary | ICD-10-CM

## 2021-08-13 DIAGNOSIS — M1712 Unilateral primary osteoarthritis, left knee: Secondary | ICD-10-CM

## 2021-08-13 NOTE — Progress Notes (Signed)
Office Visit Note   Patient: Caitlyn [name]           Date of Birth: [birth year]           MRN: [id number] Visit Date: 08/13/2021              Requested by: Hulan Fess, MD [place],  [place] [address] PCP: Hulan Fess, MD   Assessment & Plan: Visit Diagnoses:  1. Neck pain   2. Chronic pain of left knee     Plan: Left knee injection performed which she tolerated well.  She can follow-up as needed.  Follow-Up Instructions: No follow-ups on file.   Orders:  Orders Placed This Encounter  Procedures   Large Joint Inj   XR Cervical Spine 2 or 3 views   XR Knee 1-2 Views Left   No orders of the defined types were placed in this encounter.     Procedures: Large Joint Inj: L knee on 08/14/2021 8:21 AM Indications: joint swelling and pain Details: 22 G 1.5 in needle, medial approach  Arthrogram: No  Medications: 0.5 mL lidocaine 1 %; 3 mL bupivacaine 0.5 %; 40 mg methylPREDNISolone acetate 40 MG/ML Outcome: tolerated well, no immediate complications Procedure, treatment alternatives, risks and benefits explained, specific risks discussed. Consent was given by the patient. Immediately prior to procedure a time out was called to verify the correct patient, procedure, equipment, support staff and site/side marked as required. Patient was prepped and draped in the usual sterile fashion.      Clinical Data: No additional findings.   Subjective: Chief Complaint  Patient presents with   multiple body aches    HPI 87 year old female with scoliosis previous cervical fusion states she has pain throughout her body.  She states her problems with diarrhea and constipation seem to be doing a little bit better with multiple medication changes.  This has been a chronic problem.  She has some limitation of range of motion of her cervical spine with some pain.  No myelopathic changes.  She has had more pain in her left knee.  Review of Systems negative for  chills or fever.  All other systems noncontributory to HPI.   Objective: Vital Signs: BP (!) 167/80   Pulse 80   Ht 5\' 4"  (1.626 m)   Wt 110 lb (49.9 kg)   BMI 18.88 kg/m   Physical Exam Constitutional:      Appearance: She is well-developed.  HENT:     Head: Normocephalic.     Right Ear: External ear normal.     Left Ear: External ear normal. There is no impacted cerumen.  Eyes:     Pupils: Pupils are equal, round, and reactive to light.  Neck:     Thyroid: No thyromegaly.     Trachea: No tracheal deviation.  Cardiovascular:     Rate and Rhythm: Normal rate.  Pulmonary:     Effort: Pulmonary effort is normal.  Abdominal:     Palpations: Abdomen is soft.  Musculoskeletal:     Cervical back: No rigidity.  Skin:    General: Skin is warm and dry.  Neurological:     Mental Status: She is alert and oriented to person, place, and time.  Psychiatric:        Behavior: Behavior normal.    Ortho Exam patient is a 50% cervical range of motion right and left with discomfort some brachial plexus tenderness.  Upper extremity reflexes are 2+ and symmetrical.  She is amatory with a cane.  Scoliosis with mild pelvic obliquity.  Negative logroll the hips.  2+ knee effusion on the left knee with medial more than lateral joint line tenderness.  Specialty Comments:  No specialty comments available.  Imaging: XR Knee 1-2 Views Left  Result Date: 08/14/2021 Standing AP both knees lateral left knee demonstrates medial compartment bone-on-bone changes right knee and 80% loss of joint space left knee worse on the medial compartment.  Negative for acute changes. Impression: Bilateral knee osteoarthritis worse in the medial compartment worse on the right knee than left knee.  XR Cervical Spine 2 or 3 views  Result Date: 08/14/2021 AP lateral cervical spine x-rays are obtained and reviewed.  This shows two-level cervical fusion at C4-5 and C6-7 with no fusion at C5-6.  There is progressive  narrowing at C5-6 with facet arthropathy at C5-6 as well as C3-4 which is progressed since 04/28/2019 images. Impression: Solid C4-5, C6-7 fusion with degenerative changes progressive at C3-4  and C5-6.    PMFS History: Patient Active Problem List   Diagnosis Date Noted   Lumbar foraminal stenosis 11/13/2020   Unilateral primary osteoarthritis, right knee 05/17/2019   Cervical facet syndrome 04/28/2019   History of fusion of cervical spine 03/10/2018   Neck pain 03/02/2018   Age-related osteoporosis without current pathological fracture 01/01/2018   Abnormal CXR 05/25/2017   Hypertensive retinopathy 12/04/2015   Chest pain with moderate risk for cardiac etiology 10/15/2015   Scoliosis deformity of spine 10/15/2015   Hyperlipidemia 10/16/2013   Essential hypertension 10/16/2013   Mitral valve prolapse 10/16/2013   Past Medical History:  Diagnosis Date   AC (acromioclavicular) joint bone spurs 2008   Back   Anemia    Arthritis    Blood transfusion    Bronchitis    COPD (chronic obstructive pulmonary disease) (HCC)    GERD (gastroesophageal reflux disease)    uses tums   Hepatitis    1970's   Hyperlipidemia    Hypertension    Mitral valve prolapse 10/16/2013   MVP (mitral valve prolapse)    with mild to mod MR and TR 04/2012; followed by Dr. Rollene [facility])   Osteoporosis    Sleep apnea    no treatment at this time, had sleep study apprx 8 years ago    Family History  Problem Relation Age of Onset   Alzheimer's disease Mother    Alzheimer's disease Father     Past Surgical History:  Procedure Laterality Date   ABDOMINAL HYSTERECTOMY  1964   ANTERIOR CERVICAL DECOMP/DISCECTOMY [place]  06/16/2012   Procedure: HERNIA REPAIR INGUINAL ADULT;  Surgeon: Madilyn Hook, DO;  Location: [facility];  Service: General;  Laterality: Right;   KNEE ARTHROSCOPY  2007 & 2008   NM MYOCAR PERF WALL MOTION  05/27/2011    normal   SPINE SURGERY     TONGUE BIOPSY  2000   US ECHOCARDIOGRAPHY  06/07/2012   mild concentric LVH,LA mod. dilated,RA is mild to mod. dilated, mild MVP,prolapse of the posterior leaflet,mild mitral annular ca+,mild to mod MR & TR, AOV mildly sclerotic, aortic root sclerosis/ca+, insignificant pericardial effusion.   UVULECTOMY     Social History   Occupational History   Not on file  Tobacco Use   Smoking status: Never   Smokeless tobacco: Never  Substance and Sexual Activity   Alcohol use: Yes  Comment: occassional   Drug use: No   Sexual activity: Not on file

## 2021-08-14 DIAGNOSIS — M1712 Unilateral primary osteoarthritis, left knee: Secondary | ICD-10-CM

## 2021-08-14 DIAGNOSIS — M47812 Spondylosis without myelopathy or radiculopathy, cervical region: Secondary | ICD-10-CM | POA: Diagnosis not present

## 2021-08-14 MED ORDER — METHYLPREDNISOLONE ACETATE 40 MG/ML IJ SUSP
40.0000 mg | INTRAMUSCULAR | Status: AC | PRN
  Administered 2021-08-14: 40 mg via INTRA_ARTICULAR

## 2021-08-14 MED ORDER — LIDOCAINE HCL 1 % IJ SOLN
0.5000 mL | INTRAMUSCULAR | Status: AC | PRN
  Administered 2021-08-14: .5 mL

## 2021-08-14 MED ORDER — BUPIVACAINE HCL 0.5 % IJ SOLN
3.0000 mL | INTRAMUSCULAR | Status: AC | PRN
  Administered 2021-08-14: 3 mL via INTRA_ARTICULAR

## 2021-08-19 DIAGNOSIS — R0981 Nasal congestion: Secondary | ICD-10-CM | POA: Diagnosis not present

## 2021-08-19 DIAGNOSIS — U071 COVID-19: Secondary | ICD-10-CM | POA: Diagnosis not present

## 2021-08-19 DIAGNOSIS — R051 Acute cough: Secondary | ICD-10-CM | POA: Diagnosis not present

## 2021-08-29 ENCOUNTER — Telehealth: Payer: Self-pay | Admitting: Orthopaedic Surgery

## 2021-08-29 NOTE — Telephone Encounter (Signed)
Pt called stating her tail bone has been giving her a lot of pain and she would like a CB to be advised of some things she can try to get some relief. Pt states she has covid so she can't make an appt right now.  [phone]

## 2021-09-19 ENCOUNTER — Encounter (INDEPENDENT_AMBULATORY_CARE_PROVIDER_SITE_OTHER): Payer: Medicare Other | Admitting: Ophthalmology

## 2021-09-27 ENCOUNTER — Encounter (INDEPENDENT_AMBULATORY_CARE_PROVIDER_SITE_OTHER): Payer: Medicare Other | Admitting: Ophthalmology

## 2021-09-27 ENCOUNTER — Other Ambulatory Visit: Payer: Self-pay

## 2021-09-27 DIAGNOSIS — H35033 Hypertensive retinopathy, bilateral: Secondary | ICD-10-CM | POA: Diagnosis not present

## 2021-09-27 DIAGNOSIS — H35342 Macular cyst, hole, or pseudohole, left eye: Secondary | ICD-10-CM

## 2021-09-27 DIAGNOSIS — I10 Essential (primary) hypertension: Secondary | ICD-10-CM

## 2021-09-27 DIAGNOSIS — H348312 Tributary (branch) retinal vein occlusion, right eye, stable: Secondary | ICD-10-CM | POA: Diagnosis not present

## 2021-09-27 DIAGNOSIS — H43813 Vitreous degeneration, bilateral: Secondary | ICD-10-CM | POA: Diagnosis not present

## 2021-09-30 ENCOUNTER — Other Ambulatory Visit: Payer: Self-pay | Admitting: Physician Assistant

## 2021-09-30 ENCOUNTER — Ambulatory Visit
Admission: RE | Admit: 2021-09-30 | Discharge: 2021-09-30 | Disposition: A | Discharge status: Home / Self Care | Payer: Medicare Other | Source: Ambulatory Visit | Attending: Physician Assistant | Admitting: Physician Assistant

## 2021-09-30 DIAGNOSIS — R109 Unspecified abdominal pain: Secondary | ICD-10-CM

## 2021-09-30 DIAGNOSIS — K5900 Constipation, unspecified: Secondary | ICD-10-CM

## 2021-09-30 DIAGNOSIS — N200 Calculus of kidney: Secondary | ICD-10-CM | POA: Diagnosis not present

## 2021-09-30 DIAGNOSIS — M419 Scoliosis, unspecified: Secondary | ICD-10-CM | POA: Diagnosis not present

## 2021-10-14 DIAGNOSIS — M810 Age-related osteoporosis without current pathological fracture: Secondary | ICD-10-CM | POA: Diagnosis not present

## 2021-10-14 DIAGNOSIS — D649 Anemia, unspecified: Secondary | ICD-10-CM | POA: Diagnosis not present

## 2021-10-14 DIAGNOSIS — E785 Hyperlipidemia, unspecified: Secondary | ICD-10-CM | POA: Diagnosis not present

## 2021-10-14 DIAGNOSIS — I10 Essential (primary) hypertension: Secondary | ICD-10-CM | POA: Diagnosis not present

## 2021-10-17 DIAGNOSIS — Z0000 Encounter for general adult medical examination without abnormal findings: Secondary | ICD-10-CM | POA: Diagnosis not present

## 2021-10-22 DIAGNOSIS — Z23 Encounter for immunization: Secondary | ICD-10-CM | POA: Diagnosis not present

## 2021-10-29 ENCOUNTER — Ambulatory Visit: Payer: Medicare Other | Admitting: Orthopaedic Surgery

## 2021-11-05 ENCOUNTER — Other Ambulatory Visit: Payer: Self-pay | Admitting: Orthopaedic Surgery

## 2021-11-05 ENCOUNTER — Telehealth: Payer: Self-pay | Admitting: Orthopedic Surgery

## 2021-11-05 MED ORDER — TRAMADOL HCL 50 MG PO TABS: 50.0000 mg | ORAL_TABLET | Freq: Two times a day (BID) | ORAL | 0 refills | Status: DC | PRN

## 2021-11-05 NOTE — Telephone Encounter (Signed)
Caitlyn [name] states that she was lifting and moving some chairs the other day and now the right side of her back is hurting.  I offered her an appointment, but she requested that I just send a note back asking for pain medication to be called in to the [facility] at [facility].  Also, she would like Dr. Lorin Mercy to know that she is no longer able to get the Prolia shots.  Her call back number is [phone].

## 2021-11-05 NOTE — Progress Notes (Signed)
tr

## 2021-11-05 NOTE — Telephone Encounter (Signed)
I called patient and advised. 

## 2021-11-05 NOTE — Telephone Encounter (Signed)
Please advise 

## 2021-11-15 ENCOUNTER — Ambulatory Visit: Payer: Medicare Other | Admitting: Orthopaedic Surgery

## 2022-03-25 ENCOUNTER — Other Ambulatory Visit: Payer: Self-pay | Admitting: Physician Assistant

## 2022-03-25 ENCOUNTER — Ambulatory Visit
Admission: RE | Admit: 2022-03-25 | Discharge: 2022-03-25 | Disposition: A | Discharge status: Home / Self Care | Payer: Medicare Other | Source: Ambulatory Visit | Attending: Physician Assistant | Admitting: Physician Assistant

## 2022-03-25 DIAGNOSIS — R1032 Left lower quadrant pain: Secondary | ICD-10-CM

## 2022-03-25 DIAGNOSIS — K5900 Constipation, unspecified: Secondary | ICD-10-CM

## 2022-03-25 DIAGNOSIS — Z8719 Personal history of other diseases of the digestive system: Secondary | ICD-10-CM

## 2022-04-04 ENCOUNTER — Telehealth: Payer: Self-pay | Admitting: Cardiovascular Disease

## 2022-04-04 NOTE — Telephone Encounter (Signed)
Patient complaining of edema in BLE, SOB, low BP. Patient stated she has a history of BLE edema, but now it's extremely swollen the last couple of weeks (patient reports she also has cancer in her legs and has had biopsy). Patient has GI issues and was taking Linzess. Patient contributes the medication to her recent SOB and Edema. Patient stated since she stopped the Linzess two days ago, she is feeling a little better. Patient reported BPs that have been low. Had patient take BP while on the phone, it was 144/84 and HR was 45. Patient stated her HR is usually in the 50's. Informed patient that her HR is low and since she is SOB, scheduled her to see the first available DOD spot, which is next Tuesday. Informed patient to call her PCP who put her on Linzess about it's side effects. Encourage patient to go to the ED if her symptoms do not improve and/or get worse. Encouraged patient to reduce salt in her diet and elevated her feet to help with the edema. Patient is over due for year follow up with Dr. Sallyanne Kuster, so also scheduled 1 year follow up with him in August. Patient agreed to plan. Will forward to Dr. Sallyanne Kuster for further advisement.

## 2022-04-04 NOTE — Telephone Encounter (Signed)
Pt c/o swelling: STAT is pt has developed SOB within 24 hours  If swelling, where is the swelling located? Feet   How much weight have you gained and in what time span? Unsure   Have you gained 3 pounds in a day or 5 pounds in a week? Unsure  Do you have a log of your daily weights (if so, list)? Yes weighs around 106 lbs and has been for about a month   Are you currently taking a fluid pill? No   Are you currently SOB? No   Have you traveled recently? No     Having hypotension ranging 100's/60's as well as SOB. Someone she knows advised they believe she may have fluid in her lungs due to her SOB. SOB has worsened over the past week or so. Patient is also having issues with diarrhea and constipation. Patient also has scoliosis.

## 2022-04-04 NOTE — Telephone Encounter (Signed)
Agree with plan. Limit salt intake please

## 2022-04-08 ENCOUNTER — Ambulatory Visit: Payer: Medicare Other | Admitting: Cardiology

## 2022-04-08 ENCOUNTER — Encounter: Payer: Self-pay | Admitting: Cardiology

## 2022-04-08 VITALS — BP 158/72 | HR 95 | Ht 64.0 in | Wt 109.2 lb

## 2022-04-08 DIAGNOSIS — I10 Essential (primary) hypertension: Secondary | ICD-10-CM | POA: Diagnosis not present

## 2022-04-08 DIAGNOSIS — R0602 Shortness of breath: Secondary | ICD-10-CM

## 2022-04-08 DIAGNOSIS — I341 Nonrheumatic mitral (valve) prolapse: Secondary | ICD-10-CM

## 2022-04-08 DIAGNOSIS — E7800 Pure hypercholesterolemia, unspecified: Secondary | ICD-10-CM | POA: Diagnosis not present

## 2022-04-08 NOTE — Patient Instructions (Signed)
  Follow-Up: At [facility], you and your health needs are our priority.  As part of our continuing mission to provide you with exceptional heart care, we have created designated Provider Care Teams.  These Care Teams include your primary Cardiologist (physician) and Advanced Practice Providers (APPs -  Physician Assistants and Nurse Practitioners) who all work together to provide you with the care you need, when you need it.  We recommend signing up for the patient portal called "MyChart".  Sign up information is provided on this After Visit Summary.  MyChart is used to connect with patients for Virtual Visits (Telemedicine).  Patients are able to view lab/test results, encounter notes, upcoming appointments, etc.  Non-urgent messages can be sent to your provider as well.   To learn more about what you can do with MyChart, go to [email or web address].    Your next appointment:    AS SCHEDULED  Important Information About Sugar

## 2022-04-08 NOTE — Progress Notes (Signed)
HPI: Follow-up hypertension, hyperlipidemia and mitral valve prolapse.  Patient is followed by Dr. Sallyanne Kuster. Echocardiogram 2013 showed normal LV function, mitral valve prolapse and mild to moderate mitral regurgitation.  Nuclear study December 2016 showed ejection fraction 62%, diaphragmatic attenuation and no ischemia.  Contacted the office recently with increasing dyspnea and bilateral lower extremity edema.  She attributed this to [place] which was initiated by her primary care physician and she subsequently discontinued this.  She also stated her heart rate was 45.  Patient has multiple complaints today.  She has scoliosis and this causes pain in her back.  She has intermittent diarrhea and constipation.  She is not losing her memory by her report.  She states recently she has had increased lower extremity edema.  She occasionally feels dyspneic.  She does not have exertional chest pain and there is no syncope.  Current Outpatient Medications  Medication Sig Dispense Refill   acetaminophen (TYLENOL) 325 MG tablet Take 650 mg by mouth every 6 (six) hours as needed.     aspirin 81 MG tablet Take 81 mg by mouth daily.     atorvastatin (LIPITOR) 20 MG tablet TAKE ONE TABLET BY MOUTH DAILY 90 tablet 2   brimonidine-timolol (COMBIGAN) 0.2-0.5 % ophthalmic solution Place 1 drop into the right eye every 12 (twelve) hours.     Cholecalciferol (VITAMIN D3) 2000 UNITS TABS Take 1 capsule by mouth daily.      Coenzyme Q10 (CO Q 10) 100 MG CAPS Take 100 mg by mouth daily.     cycloSPORINE (RESTASIS) 0.05 % ophthalmic emulsion Place 1 drop into both eyes 2 (two) times daily.      hydrochlorothiazide (MICROZIDE) 12.5 MG capsule TAKE ONE CAPSULE BY MOUTH DAILY 90 capsule 2   latanoprost (XALATAN) 0.005 % ophthalmic solution Place 1 drop into the right eye daily.     OVER THE COUNTER MEDICATION Take 1 Dose by mouth daily. CBD OIL     antiseptic oral rinse (BIOTENE) LIQD 15 mLs by Mouth Rinse route as  needed for dry mouth. (Patient not taking: Reported on 04/08/2022)     Calcium Carbonate Antacid (TUMS PO) Take 1 tablet by mouth daily. (Patient not taking: Reported on 04/08/2022)     denosumab (PROLIA) 60 MG/ML SOSY injection Inject 60 mg into the skin every 6 (six) months. (Patient not taking: Reported on 04/08/2022)     hydrocortisone (ANUSOL-HC) 25 MG suppository Place 1 suppository (25 mg total) rectally 2 (two) times daily. For 7 days (Patient not taking: Reported on 04/08/2022) 14 suppository 0   Ibuprofen-diphenhydrAMINE Cit (ADVIL PM PO) Take 1 tablet by mouth at bedtime. (Patient not taking: Reported on 04/08/2022)     Iron-Vitamins (GERITOL COMPLETE) TABS Take 1 tablet by mouth daily. (Patient not taking: Reported on 04/08/2022)     meloxicam (MOBIC) 7.5 MG tablet Take 1 tablet (7.5 mg total) by mouth daily. (Patient not taking: Reported on 04/08/2022) 30 tablet 0   NASAL SALINE NA Place into the nose as needed. (Patient not taking: Reported on 04/08/2022)     traMADol (ULTRAM) 50 MG tablet Take 1 tablet (50 mg total) by mouth 2 (two) times daily as needed. (Patient not taking: Reported on 04/08/2022) 30 tablet 0   traMADol (ULTRAM) 50 MG tablet Take 1 tablet (50 mg total) by mouth every 12 (twelve) hours as needed. (Patient not taking: Reported on 04/08/2022) 20 tablet 0   No current facility-administered medications for this visit.  Past Medical History:  Diagnosis Date   AC (acromioclavicular) joint bone spurs 2008   Back   Anemia    Arthritis    Blood transfusion    Bronchitis    COPD (chronic obstructive pulmonary disease) (HCC)    GERD (gastroesophageal reflux disease)    uses tums   Hepatitis    1970's   Hyperlipidemia    Hypertension    Mitral valve prolapse 10/16/2013   MVP (mitral valve prolapse)    with mild to mod MR and TR 04/2012; followed by Dr. Rollene [facility])   Osteoporosis    Sleep apnea    no treatment at this time, had sleep study apprx 8 years ago     Past Surgical History:  Procedure Laterality Date   [place]  06/16/2012   Procedure: HERNIA REPAIR INGUINAL ADULT;  Surgeon: Madilyn Hook, DO;  Location: [place];  Service: General;  Laterality: Right;   KNEE ARTHROSCOPY  2007 & 2008   NM MYOCAR PERF WALL MOTION  05/27/2011   normal   SPINE SURGERY     TONGUE BIOPSY  2000   US ECHOCARDIOGRAPHY  06/07/2012   mild concentric LVH,LA mod. dilated,RA is mild to mod. dilated, mild MVP,prolapse of the posterior leaflet,mild mitral annular ca+,mild to mod MR & TR, AOV mildly sclerotic, aortic root sclerosis/ca+, insignificant pericardial effusion.   UVULECTOMY      Social History   Socioeconomic History   Marital status: Married    Spouse name: Not on file   Number of children: Not on file   Years of education: Not on file   Highest education level: Not on file  Occupational History   Not on file  Tobacco Use   Smoking status: Never   Smokeless tobacco: Never  Substance and Sexual Activity   Alcohol use: Yes    Comment: occassional   Drug use: No   Sexual activity: Not on file  Other Topics Concern   Not on file  Social History Narrative   Not on file   Social Determinants of Health   Financial Resource Strain: Not on file  Food Insecurity: Not on file  Transportation Needs: Not on file  Physical Activity: Not on file  Stress: Not on file  Social Connections: Not on file  Intimate Partner Violence: Not on file    Family History  Problem Relation Age of Onset   Alzheimer's disease Mother    Alzheimer's disease Father     ROS: no fevers or chills, productive cough, hemoptysis, dysphasia, odynophagia, melena, hematochezia, dysuria, hematuria, rash, seizure activity, orthopnea, PND, pedal edema, claudication. Remaining systems are negative.  Physical Exam: Well-developed  well-nourished in no acute distress.  Skin is warm and dry.  HEENT is normal.  Neck is supple.  Chest is clear to auscultation with normal expansion.  Cardiovascular exam is regular rate and rhythm.  Abdominal exam nontender or distended. No masses palpated. Extremities show no edema. neuro grossly intact  ECG-sinus rhythm with PACs and PVCs, no ST changes.  Personally reviewed  A/P  1 dyspnea-patient not volume overloaded on examination and this does not appear to be particularly symptomatic.  No further evaluation at this point unless symptoms worsen.  2 bradycardia-she states her heart rate has been low at home but she did bring records and it is actually in  the 70s and 80s.  Her electrocardiogram today shows heart rate of 95.  Heart rate may be falsely low on her device due to PACs or PVCs.  3 history of mitral valve prolapse with mild to moderate mitral regurgitation-no murmur on examination.  4 hypertension-blood pressure mildly elevated but she follows this at home and it is controlled.  5 hyperlipidemia-continue statin  Kirk Ruths, MD

## 2022-04-11 ENCOUNTER — Other Ambulatory Visit: Payer: Self-pay | Admitting: Cardiovascular Disease

## 2022-04-23 ENCOUNTER — Encounter: Payer: Self-pay | Admitting: Orthopaedic Surgery

## 2022-04-23 ENCOUNTER — Ambulatory Visit: Payer: Medicare Other | Admitting: Orthopaedic Surgery

## 2022-04-23 ENCOUNTER — Ambulatory Visit: Payer: Self-pay

## 2022-04-23 VITALS — BP 136/72 | HR 80

## 2022-04-23 DIAGNOSIS — M545 Low back pain, unspecified: Secondary | ICD-10-CM | POA: Diagnosis not present

## 2022-04-23 DIAGNOSIS — G8929 Other chronic pain: Secondary | ICD-10-CM

## 2022-04-24 NOTE — Progress Notes (Signed)
Office Visit Note   Patient: Caitlyn [name]           Date of Birth: 23-Feb-1930           MRN: [id number] Visit Date: 04/23/2022              Requested by: Hulan Fess, MD [place],  [place] [address] PCP: Hulan Fess, MD   Assessment & Plan: Visit Diagnoses:  1. Chronic midline low back pain, unspecified whether sciatica present     Plan: I gave her some disposable gloves and we again discussed in detail as we had several times over several years appropriate disimpaction technique.  Follow-Up Instructions: Return if symptoms worsen or fail to improve.   Orders:  Orders Placed This Encounter  Procedures   XR Lumbar Spine 2-3 Views   No orders of the defined types were placed in this encounter.     Procedures: No procedures performed   Clinical Data: No additional findings.   Subjective: Chief Complaint  Patient presents with   Lower Back - Follow-up    HPI 88 year old female returns chronic back pain.  Still has problems with constipation recurrent impaction.  She has been emergency room recently and x-rays taken today again shows evidence of significant impaction.  She continues to take a large number of medications for balance and has problems with chronic constipation and then some bouts with diarrhea.  Review of Systems updated unchanged.   Objective: Vital Signs: BP 136/72 (BP Location: Right Arm, Patient Position: Sitting, Cuff Size: Normal)   Pulse 80   SpO2 98%   Physical Exam Constitutional:      Appearance: She is well-developed.  HENT:     Head: Normocephalic.     Right Ear: External ear normal.     Left Ear: External ear normal. There is no impacted cerumen.  Eyes:     Pupils: Pupils are equal, round, and reactive to light.  Neck:     Thyroid: No thyromegaly.     Trachea: No tracheal deviation.  Cardiovascular:     Rate and Rhythm: Normal rate.  Pulmonary:     Effort: Pulmonary effort is normal.  Abdominal:      Palpations: Abdomen is soft.  Musculoskeletal:     Cervical back: No rigidity.  Skin:    General: Skin is warm and dry.  Neurological:     Mental Status: She is alert and oriented to person, place, and time.  Psychiatric:        Behavior: Behavior normal.     Ortho Exam significant scoliosis.  Lumbar scoliosis.  Quads anterior tib gastrocsoleus is active and symmetrical.  Specialty Comments:  No specialty comments available.  Imaging: No results found.   PMFS History: Patient Active Problem List   Diagnosis Date Noted   Unilateral primary osteoarthritis, left knee 08/14/2021   Lumbar foraminal stenosis 11/13/2020   Unilateral primary osteoarthritis, right knee 05/17/2019   Cervical facet syndrome 04/28/2019   History of fusion of cervical spine 03/10/2018   Neck pain 03/02/2018   Age-related osteoporosis without current pathological fracture 01/01/2018   Abnormal CXR 05/25/2017   Hypertensive retinopathy 12/04/2015   Chest pain with moderate risk for cardiac etiology 10/15/2015   Scoliosis deformity of spine 10/15/2015   Hyperlipidemia 10/16/2013   Essential hypertension 10/16/2013   Mitral valve prolapse 10/16/2013   Past Medical History:  Diagnosis Date   AC (acromioclavicular) joint bone spurs 2008   Back   Anemia  Arthritis    Blood transfusion    Bronchitis    COPD (chronic obstructive pulmonary disease) (HCC)    GERD (gastroesophageal reflux disease)    uses tums   Hepatitis    1970's   Hyperlipidemia    Hypertension    Mitral valve prolapse 10/16/2013   MVP (mitral valve prolapse)    with mild to mod MR and TR 04/2012; followed by Dr. Rollene Fare [facility])   Osteoporosis    Sleep apnea    no treatment at this time, had sleep study apprx 8 years ago    Family History  Problem Relation Age of Onset   Alzheimer's disease Mother    Alzheimer's disease Father     Past Surgical History:  Procedure Laterality Date   ABDOMINAL HYSTERECTOMY  1964    ANTERIOR CERVICAL DECOMP/DISCECTOMY [place]  06/16/2012   Procedure: HERNIA REPAIR INGUINAL ADULT;  Surgeon: Madilyn Hook, DO;  Location: [place];  Service: General;  Laterality: Right;   KNEE ARTHROSCOPY  2007 & 2008   NM MYOCAR PERF WALL MOTION  05/27/2011   normal   SPINE SURGERY     TONGUE BIOPSY  2000   US ECHOCARDIOGRAPHY  06/07/2012   mild concentric LVH,LA mod. dilated,RA is mild to mod. dilated, mild MVP,prolapse of the posterior leaflet,mild mitral annular ca+,mild to mod MR & TR, AOV mildly sclerotic, aortic root sclerosis/ca+, insignificant pericardial effusion.   UVULECTOMY     Social History   Occupational History   Not on file  Tobacco Use   Smoking status: Never   Smokeless tobacco: Never  Substance and Sexual Activity   Alcohol use: Yes    Comment: occassional   Drug use: No   Sexual activity: Not on file

## 2022-04-29 ENCOUNTER — Ambulatory Visit: Payer: Medicare Other | Admitting: Orthopaedic Surgery

## 2022-05-01 ENCOUNTER — Telehealth: Payer: Self-pay | Admitting: Cardiovascular Disease

## 2022-05-01 NOTE — Telephone Encounter (Signed)
Pt c/o BP issue: STAT if pt c/o blurred vision, one-sided weakness or slurred speech  1. What are your last 5 BP readings? 116/100;   2. Are you having any other symptoms (ex. Dizziness, headache, blurred vision, passed out)? Dizziness   3. What is your BP issue? Too high

## 2022-05-01 NOTE — Telephone Encounter (Signed)
Says she has checking BP daily in the evening 06/21 115/69 HR 93 06/20 116/66 HR 100 06/19 108/65 HR 96 06/18 105/65 HR 70 06/17 120/70 HR 87  Checks BP & HR after sitting. Advised to wait 5-10 minutes after sitting before she checks it Reports a little dizziness and thinks its from her scoliosis Reports being under a lot of stress Reports staying well hydrated and eats well Says she still drives and does household chores Advised to continue monitoring home BP's getting resting measurements (waiting 5-10 minutes after sitting) before checking Advised to contact office in 1 week with readings by phone or mychart Verbalized understanding of plan

## 2022-05-05 MED ORDER — HYDROCHLOROTHIAZIDE 12.5 MG PO CAPS: 12.5000 mg | ORAL_CAPSULE | ORAL | 3 refills | Status: DC

## 2022-05-14 ENCOUNTER — Ambulatory Visit: Payer: Medicare Other | Admitting: Podiatry

## 2022-05-14 ENCOUNTER — Telehealth: Payer: Self-pay | Admitting: Radiology

## 2022-05-14 ENCOUNTER — Other Ambulatory Visit: Payer: Self-pay | Admitting: Orthopaedic Surgery

## 2022-05-14 DIAGNOSIS — R202 Paresthesia of skin: Secondary | ICD-10-CM | POA: Diagnosis not present

## 2022-05-14 DIAGNOSIS — R200 Anesthesia of skin: Secondary | ICD-10-CM

## 2022-05-14 DIAGNOSIS — Z8739 Personal history of other diseases of the musculoskeletal system and connective tissue: Secondary | ICD-10-CM | POA: Diagnosis not present

## 2022-05-14 MED ORDER — TRAMADOL HCL 50 MG PO TABS: 50.0000 mg | ORAL_TABLET | Freq: Two times a day (BID) | ORAL | 0 refills | Status: DC | PRN

## 2022-05-14 NOTE — Telephone Encounter (Signed)
I called and advised that Dr. Lorin Mercy sent her in some medicine

## 2022-05-14 NOTE — Telephone Encounter (Signed)
Complaining of pain in her Left hip, states that she cant walk, nothing she has tried is working for her, she states that she wants some of the good medicine for pain. [phone]

## 2022-05-14 NOTE — Progress Notes (Signed)
Sent in tramadol # 20  one po bid prn severe pain

## 2022-05-14 NOTE — Progress Notes (Signed)
Subjective:  Patient ID: Caitlyn [name], female    DOB: 1930-05-16,  MRN: [id number]  Chief Complaint  Patient presents with   Foot Swelling    Bil swelling in feet    88 y.o. female presents with the above complaint.  Patient presents with complaint bilateral numbness tingling and edema to both lower extremity.  Patient states he came out of nowhere is progressive gotten worse.  She has a history of severe scoliosis.  Hurts with ambulation.  She states is becoming more more on the plantar foot all the way up to the mid leg.  She has not seen anyone as prior to seeing me.  She sees a back doctor for injections but no surgical plan given her age   Review of Systems: Negative except as noted in the HPI. Denies N/V/F/Ch.  Past Medical History:  Diagnosis Date   AC (acromioclavicular) joint bone spurs 2008   Back   Anemia    Arthritis    Blood transfusion    Bronchitis    COPD (chronic obstructive pulmonary disease) (HCC)    GERD (gastroesophageal reflux disease)    uses tums   Hepatitis    1970's   Hyperlipidemia    Hypertension    Mitral valve prolapse 10/16/2013   MVP (mitral valve prolapse)    with mild to mod MR and TR 04/2012; followed by Dr. Rollene Fare [facility])   Osteoporosis    Sleep apnea    no treatment at this time, had sleep study apprx 8 years ago    Current Outpatient Medications:    acetaminophen (TYLENOL) 325 MG tablet, Take 650 mg by mouth every 6 (six) hours as needed., Disp: , Rfl:    antiseptic oral rinse (BIOTENE) LIQD, 15 mLs by Mouth Rinse route as needed for dry mouth. (Patient not taking: Reported on 04/08/2022), Disp: , Rfl:    aspirin 81 MG tablet, Take 81 mg by mouth daily., Disp: , Rfl:    atorvastatin (LIPITOR) 20 MG tablet, TAKE ONE TABLET BY MOUTH DAILY, Disp: 90 tablet, Rfl: 3   brimonidine-timolol (COMBIGAN) 0.2-0.5 % ophthalmic solution, Place 1 drop into the right eye every 12 (twelve) hours., Disp: , Rfl:    Calcium Carbonate Antacid (TUMS  PO), Take 1 tablet by mouth daily. (Patient not taking: Reported on 04/08/2022), Disp: , Rfl:    Cholecalciferol (VITAMIN D3) 2000 UNITS TABS, Take 1 capsule by mouth daily. , Disp: , Rfl:    Coenzyme Q10 (CO Q 10) 100 MG CAPS, Take 100 mg by mouth daily., Disp: , Rfl:    cycloSPORINE (RESTASIS) 0.05 % ophthalmic emulsion, Place 1 drop into both eyes 2 (two) times daily. , Disp: , Rfl:    denosumab (PROLIA) 60 MG/ML SOSY injection, Inject 60 mg into the skin every 6 (six) months. (Patient not taking: Reported on 04/08/2022), Disp: , Rfl:    hydrochlorothiazide (MICROZIDE) 12.5 MG capsule, Take 1 capsule (12.5 mg total) by mouth every other day., Disp: 90 capsule, Rfl: 3   Iron-Vitamins (GERITOL COMPLETE) TABS, Take 1 tablet by mouth daily. (Patient not taking: Reported on 04/08/2022), Disp: , Rfl:    latanoprost (XALATAN) 0.005 % ophthalmic solution, Place 1 drop into the right eye daily. (Patient not taking: Reported on 04/23/2022), Disp: , Rfl:    OVER THE COUNTER MEDICATION, Take 1 Dose by mouth daily. CBD OIL, Disp: , Rfl:   Social History   Tobacco Use  Smoking Status Never  Smokeless Tobacco Never    Allergies  Allergen Reactions   Fosamax [Alendronate Sodium] Nausea Only    Makes her feel sick   Amoxicillin-Pot Clavulanate Other (See Comments)   Crestor [Rosuvastatin] Other (See Comments)   Doxycycline Other (See Comments)   Gabapentin Other (See Comments)   Latex Hives   Statins     Patient indicated she was not able to tolerate medications containing Statins    Objective:  There were no vitals filed for this visit. There is no height or weight on file to calculate BMI. Constitutional Well developed. Well nourished.  Vascular Dorsalis pedis pulses palpable bilaterally. Posterior tibial pulses palpable bilaterally. Capillary refill normal to all digits.  No cyanosis or clubbing noted. Pedal hair growth normal.  Neurologic Normal speech. Oriented to person, place, and  time. Epicritic sensation to light touch grossly present bilaterally.  Dermatologic Numbness tingling noted with negative Tinel's sign.  Numbness tingling noted to both plantar foot as well as the digits traveling proximally  Orthopedic: Normal joint ROM without pain or crepitus bilaterally. No visible deformities. No bony tenderness.   Radiographs: None Assessment:   1. Numbness and tingling   2. History of back pain    Plan:  Patient was evaluated and treated and all questions answered.  Bilateral numbness tingling with a history of scoliosis and low back arthritis -All questions and concerns were discussed with the patient extensive detail -Ultimately discussed with her that she needs to see a spine specialist to be further evaluated as this is primarily coming from the spine.  At this time I am unable to offer her any treatment options given that this is primarily coming from upper extremity. -I discussed shoe gear modification briefly as well as importance of insoles.  Return if symptoms worsen or fail to improve.

## 2022-05-30 ENCOUNTER — Telehealth: Payer: Self-pay | Admitting: Orthopaedic Surgery

## 2022-05-30 NOTE — Telephone Encounter (Signed)
Received call from patient wanting Dr. Lorin Mercy to know that she is having surgery on her knees beginning June 23, 2022. 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 Patient said there is a third form of cancer but she didn't know the name of the third one. Patient said she thought Dr. Lorin Mercy would want to know. The number to contact patient is [phone]

## 2022-06-20 ENCOUNTER — Ambulatory Visit: Payer: Medicare Other | Admitting: Cardiovascular Disease

## 2022-07-03 ENCOUNTER — Telehealth: Payer: Self-pay | Admitting: Orthopaedic Surgery

## 2022-07-03 NOTE — Telephone Encounter (Signed)
I called and spoke with patient. She has previously gotten injections with Dr. Ernestina [name], the last of which was very painful. She has also gotten Prolia injections with Dr. Noemi [name] in the past. She would like to know what Dr. Lorin [name] feels would be best for her to try. Explained that the Prolia was to help with her bone density and she could follow up with Dr. Layne [name] for this treatment. She has seen Dr. Layne [name] and will call her to make an appointment to discuss.  She also states that the back pain that she has is now down into her tailbone. She is unable to sit comfortably and cannot lay on her back due to scoliosis. She wonders if there is anything that can be done for that? Dr. Lorin [name] advised that she could try to use a pillow under one side of her buttocks to offload the pressure and see if that works. Patient will try this.

## 2022-07-03 NOTE — Telephone Encounter (Signed)
Pt called and is wondering if someone can call her about her medications. Also she is having a lot of back problems.   Cb [phone]

## 2022-07-11 DIAGNOSIS — C44729 Squamous cell carcinoma of skin of left lower limb, including hip: Secondary | ICD-10-CM | POA: Diagnosis not present

## 2022-08-07 DIAGNOSIS — K5900 Constipation, unspecified: Secondary | ICD-10-CM | POA: Diagnosis not present

## 2022-08-07 DIAGNOSIS — D509 Iron deficiency anemia, unspecified: Secondary | ICD-10-CM | POA: Diagnosis not present

## 2022-08-28 DIAGNOSIS — D485 Neoplasm of uncertain behavior of skin: Secondary | ICD-10-CM | POA: Diagnosis not present

## 2022-08-28 DIAGNOSIS — C44722 Squamous cell carcinoma of skin of right lower limb, including hip: Secondary | ICD-10-CM | POA: Diagnosis not present

## 2022-09-16 ENCOUNTER — Ambulatory Visit: Payer: Medicare Other | Admitting: Orthopaedic Surgery

## 2022-09-16 VITALS — BP 151/79 | HR 93

## 2022-09-16 DIAGNOSIS — M1711 Unilateral primary osteoarthritis, right knee: Secondary | ICD-10-CM | POA: Diagnosis not present

## 2022-09-16 NOTE — Progress Notes (Signed)
Office Visit Note   Patient: Caitlyn [name]           Date of Birth: 1930-07-19           MRN: [id number] Visit Date: 09/16/2022              Requested by: Hulan Fess, MD [place],  [place] [id number] PCP: Hulan Fess, MD   Assessment & Plan: Visit Diagnoses:  1. Unilateral primary osteoarthritis, right knee     Plan: Knee x-rays have shown bone-on-bone changes medial compartment.  She has had previous injections last short-term.  We discussed intermittent ice she can return for an injection if her knee symptoms increase.  She states she feels she is too old to consider total knee arthroplasty.  Follow-Up Instructions: No follow-ups on file.   Orders:  No orders of the defined types were placed in this encounter.  No orders of the defined types were placed in this encounter.     Procedures: No procedures performed   Clinical Data: No additional findings.   Subjective: Chief Complaint  Patient presents with   Lower Back - Pain   Right Knee - Pain    HPI 88 year old female returns with right knee pain.  She states the pain hurts most of the day.  She continues to have chronic back pain with her significant scoliosis and disc degeneration.  She had some skin cancer surgery on her legs.  She has swelling in her feet we discussed using her support stockings to help with this.  Review of Systems updated unchanged   Objective: Vital Signs: BP (!) 151/79   Pulse 93   Physical Exam Constitutional:      Appearance: She is well-developed.  HENT:     Head: Normocephalic.     Right Ear: External ear normal.     Left Ear: External ear normal. There is no impacted cerumen.  Eyes:     Pupils: Pupils are equal, round, and reactive to light.  Neck:     Thyroid: No thyromegaly.     Trachea: No tracheal deviation.  Cardiovascular:     Rate and Rhythm: Normal rate.  Pulmonary:     Effort: Pulmonary effort is normal.  Abdominal:      Palpations: Abdomen is soft.  Musculoskeletal:     Cervical back: No rigidity.  Skin:    General: Skin is warm and dry.  Neurological:     Mental Status: She is alert and oriented to person, place, and time.  Psychiatric:        Behavior: Behavior normal.     Ortho Exam some venous discoloration anteriorly over both shin regions and mild pitting edema.  Anterior tib gastrocsoleus is strong.  Knee has crepitus with knee range of motion.  Specialty Comments:  No specialty comments available.  Imaging: No results found.   PMFS History: Patient Active Problem List   Diagnosis Date Noted   Unilateral primary osteoarthritis, left knee 08/14/2021   Lumbar foraminal stenosis 11/13/2020   Unilateral primary osteoarthritis, right knee 05/17/2019   Cervical facet syndrome 04/28/2019   History of fusion of cervical spine 03/10/2018   Neck pain 03/02/2018   Age-related osteoporosis without current pathological fracture 01/01/2018   Abnormal CXR 05/25/2017   Hypertensive retinopathy 12/04/2015   Chest pain with moderate risk for cardiac etiology 10/15/2015   Scoliosis deformity of spine 10/15/2015   Hyperlipidemia 10/16/2013   Essential hypertension 10/16/2013   Mitral valve prolapse 10/16/2013  Past Medical History:  Diagnosis Date   AC (acromioclavicular) joint bone spurs 2008   Back   Anemia    Arthritis    Blood transfusion    Bronchitis    COPD (chronic obstructive pulmonary disease) (HCC)    GERD (gastroesophageal reflux disease)    uses tums   Hepatitis    1970's   Hyperlipidemia    Hypertension    Mitral valve prolapse 10/16/2013   MVP (mitral valve prolapse)    with mild to mod MR and TR 04/2012; followed by Dr. Rollene Fare [facility])   Osteoporosis    Sleep apnea    no treatment at this time, had sleep study apprx 8 years ago    Family History  Problem Relation Age of Onset   Alzheimer's disease Mother    Alzheimer's disease Father     Past Surgical History:   Procedure Laterality Date   ABDOMINAL HYSTERECTOMY  1964   ANTERIOR CERVICAL DECOMP/DISCECTOMY [place]  06/16/2012   Procedure: HERNIA REPAIR INGUINAL ADULT;  Surgeon: Madilyn Hook, DO;  Location: [place];  Service: General;  Laterality: Right;   KNEE ARTHROSCOPY  2007 & 2008   NM MYOCAR PERF WALL MOTION  05/27/2011   normal   SPINE SURGERY     TONGUE BIOPSY  2000   US ECHOCARDIOGRAPHY  06/07/2012   mild concentric LVH,LA mod. dilated,RA is mild to mod. dilated, mild MVP,prolapse of the posterior leaflet,mild mitral annular ca+,mild to mod MR & TR, AOV mildly sclerotic, aortic root sclerosis/ca+, insignificant pericardial effusion.   UVULECTOMY     Social History   Occupational History   Not on file  Tobacco Use   Smoking status: Never   Smokeless tobacco: Never  Substance and Sexual Activity   Alcohol use: Yes    Comment: occassional   Drug use: No   Sexual activity: Not on file

## 2022-09-23 ENCOUNTER — Ambulatory Visit: Payer: Medicare Other | Attending: Cardiovascular Disease | Admitting: Cardiovascular Disease

## 2022-09-23 ENCOUNTER — Encounter: Payer: Self-pay | Admitting: Cardiovascular Disease

## 2022-09-23 VITALS — BP 128/72 | HR 78 | Ht 64.0 in | Wt 109.0 lb

## 2022-09-23 DIAGNOSIS — E7800 Pure hypercholesterolemia, unspecified: Secondary | ICD-10-CM | POA: Diagnosis not present

## 2022-09-23 DIAGNOSIS — I10 Essential (primary) hypertension: Secondary | ICD-10-CM | POA: Diagnosis not present

## 2022-09-23 DIAGNOSIS — E440 Moderate protein-calorie malnutrition: Secondary | ICD-10-CM

## 2022-09-23 DIAGNOSIS — I341 Nonrheumatic mitral (valve) prolapse: Secondary | ICD-10-CM

## 2022-09-23 NOTE — Progress Notes (Signed)
Patient ID: Caitlyn [name], female   DOB: [name], 88 y.o.   MRN: [id number]    Cardiology Office Note    Date:  09/27/2022   ID:  Caitlyn [name], MRN [id number]  PCP:  Hulan Fess, MD  Cardiologist:   Sanda Klein, MD   Chief Complaint  Patient presents with   Hypertension    History of Present Illness:  Caitlyn [name] is a 88 y.o. female with long-standing systemic hypertension and hyperlipidemia and mild mitral valve prolapse returning for follow-up.   Her major complaints continue to be related to severe pain due to thoracic scoliosis, which has no surgical solution.  She also has alternating diarrhea and constipation.  She has lost a lot of weight and her BMI is down to only 18.  She complains of being very sleepy all day long.  Her blood pressure is now well controlled on a very low-dose of hydrochlorothiazide monotherapy.  Her lipid profile is excellent with an HDL of 87 and LDL of 68, on statin.  She is not aware of palpitations, but she had frequent ectopic beats on her physical exam.  She denies orthopnea or PND.  She has musculoskeletal chest pain, but no exertional angina.  She has not had falls.  She denies bleeding.  She has had problems with bilateral lower extremity edema and she does have varicose veins, but currently she does not have any leg swelling.  She has no known history of coronary disease with a normal nuclear stress test in 2012 and no history of previous cardiac catheterization.In the past she has had side effects with certain statins (simvastatin, Crestor) but she seems to tolerate atorvastatin reasonably well.    Past Medical History:  Diagnosis Date   AC (acromioclavicular) joint bone spurs 2008   Back   Anemia    Arthritis    Blood transfusion    Bronchitis    COPD (chronic obstructive pulmonary disease) (HCC)    GERD (gastroesophageal reflux disease)    uses tums   Hepatitis    1970's   Hyperlipidemia     Hypertension    Mitral valve prolapse 10/16/2013   MVP (mitral valve prolapse)    with mild to mod MR and TR 04/2012; followed by Dr. Rollene Fare [facility])   Osteoporosis    Sleep apnea    no treatment at this time, had sleep study apprx 8 years ago    Past Surgical History:  Procedure Laterality Date   [place]  06/16/2012   Procedure: HERNIA REPAIR INGUINAL ADULT;  Surgeon: Madilyn Hook, DO;  Location: [facility];  Service: General;  Laterality: Right;   KNEE ARTHROSCOPY  2007 & 2008   NM MYOCAR PERF WALL MOTION  05/27/2011   normal   SPINE SURGERY     TONGUE BIOPSY  2000   US ECHOCARDIOGRAPHY  06/07/2012   mild concentric LVH,LA mod. dilated,RA is mild to mod. dilated, mild MVP,prolapse of the posterior leaflet,mild mitral annular ca+,mild to mod MR & TR, AOV mildly sclerotic, aortic root sclerosis/ca+, insignificant pericardial effusion.   UVULECTOMY      Outpatient Medications Prior to Visit  Medication Sig Dispense Refill   antiseptic oral rinse (BIOTENE) LIQD 15 mLs by Mouth Rinse route as needed for dry mouth.     aspirin 81 MG tablet Take  81 mg by mouth daily.     atorvastatin (LIPITOR) 20 MG tablet TAKE ONE TABLET BY MOUTH DAILY 90 tablet 3   Cholecalciferol (VITAMIN D3) 2000 UNITS TABS Take 1 capsule by mouth daily.      cycloSPORINE (RESTASIS) 0.05 % ophthalmic emulsion Place 1 drop into both eyes 2 (two) times daily.      denosumab (PROLIA) 60 MG/ML SOSY injection Inject 60 mg into the skin every 6 (six) months.     hydrochlorothiazide (MICROZIDE) 12.5 MG capsule Take 1 capsule (12.5 mg total) by mouth every other day. 90 capsule 3   Ibuprofen (ADVIL PO) Take 1 capsule by mouth daily as needed.     Iron-Vitamins (GERITOL COMPLETE) TABS Take 1 tablet by mouth daily.     latanoprost (XALATAN) 0.005 % ophthalmic solution Place 1 drop into  the right eye daily.     LINZESS 145 MCG CAPS capsule Take 145 mcg by mouth daily.     pantoprazole (PROTONIX) 40 MG tablet Take 40 mg by mouth daily.     timolol (TIMOPTIC) 0.5 % ophthalmic solution Place 1 drop into both eyes 2 (two) times daily.     acetaminophen (TYLENOL) 325 MG tablet Take 650 mg by mouth every 6 (six) hours as needed. (Patient not taking: Reported on 09/23/2022)     Calcium Carbonate Antacid (TUMS PO) Take 1 tablet by mouth daily. (Patient not taking: Reported on 04/08/2022)     OVER THE COUNTER MEDICATION Take 1 Dose by mouth daily. CBD OIL (Patient not taking: Reported on 09/23/2022)     traMADol (ULTRAM) 50 MG tablet Take 1 tablet (50 mg total) by mouth every 12 (twelve) hours as needed. (Patient not taking: Reported on 09/23/2022) 20 tablet 0   brimonidine-timolol (COMBIGAN) 0.2-0.5 % ophthalmic solution Place 1 drop into the right eye every 12 (twelve) hours. (Patient not taking: Reported on 09/23/2022)     Coenzyme Q10 (CO Q 10) 100 MG CAPS Take 100 mg by mouth daily. (Patient not taking: Reported on 09/23/2022)     No facility-administered medications prior to visit.     Allergies:   Fosamax [alendronate sodium], Amoxicillin-pot clavulanate, Crestor [rosuvastatin], Doxycycline, Gabapentin, Latex, and Statins   Social History   Socioeconomic History   Marital status: Married    Spouse name: Not on file   Number of children: Not on file   Years of education: Not on file   Highest education level: Not on file  Occupational History   Not on file  Tobacco Use   Smoking status: Never   Smokeless tobacco: Never  Substance and Sexual Activity   Alcohol use: Yes    Comment: occassional   Drug use: No   Sexual activity: Not on file  Other Topics Concern   Not on file  Social History Narrative   Not on file   Social Determinants of Health   Financial Resource Strain: Not on file  Food Insecurity: Not on file  Transportation Needs: Not on file  Physical  Activity: Not on file  Stress: Not on file  Social Connections: Not on file     Family History:  The patient's   family history includes Alzheimer's disease in her father and mother.   ROS:   Please see the history of present illness.    ROS All other systems are reviewed and are negative.   PHYSICAL EXAM:   VS:  BP 128/72 (BP Location: Left Arm, Patient Position: Sitting, Cuff Size: Small)   Pulse  78   Ht '5\' 4"'$  (1.626 m)   Wt 49.4 kg   SpO2 93%   BMI 18.71 kg/m      General: Alert, oriented x3, no distress, thoracic dextroscoliosis Head: no evidence of trauma, PERRL, EOMI, no exophtalmos or lid lag, no myxedema, no xanthelasma; normal ears, nose and oropharynx Neck: normal jugular venous pulsations and no hepatojugular reflux; brisk carotid pulses without delay and no carotid bruits Chest: clear to auscultation, no signs of consolidation by percussion or palpation, normal fremitus, symmetrical and full respiratory excursions Cardiovascular: normal position and quality of the apical impulse, regular rhythm with frequent ectopic beats, normal first and second heart sounds, has a midsystolic click, but no murmurs, rubs or gallops Abdomen: no tenderness or distention, no masses by palpation, no abnormal pulsatility or arterial bruits, normal bowel sounds, no hepatosplenomegaly Extremities: no clubbing, cyanosis or edema; 2+ radial, ulnar and brachial pulses bilaterally; 2+ right femoral, posterior tibial and dorsalis pedis pulses; 2+ left femoral, posterior tibial and dorsalis pedis pulses; no subclavian or femoral bruits Neurological: grossly nonfocal Psych: Normal mood and affect     Wt Readings from Last 3 Encounters:  09/23/22 49.4 kg  04/08/22 49.5 kg  08/13/21 49.9 kg      Studies/Labs Reviewed:   EKG:  EKG is ordered today.  It shows normal sinus rhythm and a QS pattern in leads V1-V2 to, similar to previous tracings.  No ectopy seen on today's tracing, but past ECGs  have shown premature atrial contractions.  Recent Labs: No results found for requested labs within last 365 days.   Lipid Panel    Component Value Date/Time   CHOL 162 12/29/2017 1100   TRIG 60 12/29/2017 1100   HDL 95 12/29/2017 1100   CHOLHDL 1.7 12/29/2017 1100   CHOLHDL 1.8 11/27/2015 1125   VLDL 13 11/27/2015 1125   LDLCALC 55 12/29/2017 1100   09/12/2019 Total cholesterol 163, HDL 84, LDL 67, triglycerides 61 Hemoglobin 12.8, creatinine 0.67, potassium 4.4, TSH 2.51  09/25/2020 Total cholesterol 165, HDL 86, LDL 66, triglycerides 60 Hemoglobin 11.7, creatinine 0.58, potassium 4.7, normal liver function tests, TSH 3.37  10/14/2021 Cholesterol 168, HDL 87, LDL 68, triglycerides 69, TSH 2.810  08/07/2022 Hemoglobin 11.2, creatinine 0.71, potassium 4.3, ALT 18  ASSESSMENT:    1. Essential hypertension   2. Hypercholesterolemia   3. Mitral valve prolapse      PLAN:  In order of problems listed above:  HTN: Very well controlled HLP: All parameters in target range MVP: Asymptomatic.  Has some PACs, but she is unaware of these.  No murmurs heard on exam. Underweight: I think malnutrition is her most serious current problem.  Talked about nutritional supplements.  She is occasionally drinking 1 a day.  Should increase the number of nutritional shakes.   Medication Adjustments/Labs and Tests Ordered: Current medicines are reviewed at length with the patient today.  Concerns regarding medicines are outlined above.  Medication changes, Labs and Tests ordered today are listed in the Patient Instructions below. Patient Instructions  Medication Instructions:  Continue same medications *If you need a refill on your cardiac medications before your next appointment, please call your pharmacy*   Lab Work: None ordered   Testing/Procedures: None    Follow-Up: At [facility], you and your health needs are our priority.  As part of our continuing mission to  provide you with exceptional heart care, we have created designated Provider Care Teams.  These Care Teams include your primary  Cardiologist (physician) and Advanced Practice Providers (APPs -  Physician Assistants and Nurse Practitioners) who all work together to provide you with the care you need, when you need it.  We recommend signing up for the patient portal called "MyChart".  Sign up information is provided on this After Visit Summary.  MyChart is used to connect with patients for Virtual Visits (Telemedicine).  Patients are able to view lab/test results, encounter notes, upcoming appointments, etc.  Non-urgent messages can be sent to your provider as well.   To learn more about what you can do with MyChart, go to [email or web address].    Your next appointment:  1 year   Call in August to schedule Nov appointment     The format for your next appointment: Office    Provider:  Dr.Hernan Turnage  Important Information About Sugar           Signed, Sanda Klein, MD  09/27/2022 6:28 PM    [address], [place], [place]  [address] Phone: [phone]; Fax: [phone]

## 2022-09-23 NOTE — Patient Instructions (Signed)
Medication Instructions:  Continue same medications *If you need a refill on your cardiac medications before your next appointment, please call your pharmacy*   Lab Work: None ordered   Testing/Procedures: None    Follow-Up: At [facility], you and your health needs are our priority.  As part of our continuing mission to provide you with exceptional heart care, we have created designated Provider Care Teams.  These Care Teams include your primary Cardiologist (physician) and Advanced Practice Providers (APPs -  Physician Assistants and Nurse Practitioners) who all work together to provide you with the care you need, when you need it.  We recommend signing up for the patient portal called "MyChart".  Sign up information is provided on this After Visit Summary.  MyChart is used to connect with patients for Virtual Visits (Telemedicine).  Patients are able to view lab/test results, encounter notes, upcoming appointments, etc.  Non-urgent messages can be sent to your provider as well.   To learn more about what you can do with MyChart, go to [email or web address].    Your next appointment:  1 year   Call in August to schedule Nov appointment     The format for your next appointment: Office    Provider:  Dr.Croitoru  Important Information About Sugar

## 2022-09-27 ENCOUNTER — Encounter: Payer: Self-pay | Admitting: Cardiovascular Disease

## 2022-10-10 DIAGNOSIS — C44722 Squamous cell carcinoma of skin of right lower limb, including hip: Secondary | ICD-10-CM | POA: Diagnosis not present

## 2022-10-10 DIAGNOSIS — Z08 Encounter for follow-up examination after completed treatment for malignant neoplasm: Secondary | ICD-10-CM | POA: Diagnosis not present

## 2022-10-10 DIAGNOSIS — D485 Neoplasm of uncertain behavior of skin: Secondary | ICD-10-CM | POA: Diagnosis not present

## 2022-10-10 DIAGNOSIS — Z85828 Personal history of other malignant neoplasm of skin: Secondary | ICD-10-CM | POA: Diagnosis not present

## 2022-10-10 DIAGNOSIS — C4492 Squamous cell carcinoma of skin, unspecified: Secondary | ICD-10-CM | POA: Diagnosis not present

## 2022-10-23 ENCOUNTER — Telehealth: Payer: Self-pay | Admitting: Orthopaedic Surgery

## 2022-10-23 NOTE — Telephone Encounter (Signed)
Pt is asking for a call back from Dr Lorin Mercy about her pains she has in left hip with lump and need medical advice. Pt phone number is [phone].

## 2022-10-24 ENCOUNTER — Other Ambulatory Visit: Payer: Self-pay | Admitting: Orthopaedic Surgery

## 2022-10-24 MED ORDER — TRAMADOL HCL 50 MG PO TABS: 50.0000 mg | ORAL_TABLET | Freq: Two times a day (BID) | ORAL | 0 refills | Status: DC | PRN

## 2022-10-29 DIAGNOSIS — C44722 Squamous cell carcinoma of skin of right lower limb, including hip: Secondary | ICD-10-CM | POA: Diagnosis not present

## 2022-10-29 DIAGNOSIS — D485 Neoplasm of uncertain behavior of skin: Secondary | ICD-10-CM | POA: Diagnosis not present

## 2022-11-12 DIAGNOSIS — K5900 Constipation, unspecified: Secondary | ICD-10-CM | POA: Diagnosis not present

## 2022-11-12 DIAGNOSIS — R600 Localized edema: Secondary | ICD-10-CM | POA: Diagnosis not present

## 2022-11-12 DIAGNOSIS — D509 Iron deficiency anemia, unspecified: Secondary | ICD-10-CM | POA: Diagnosis not present

## 2022-11-14 DIAGNOSIS — R600 Localized edema: Secondary | ICD-10-CM | POA: Diagnosis not present

## 2022-11-14 DIAGNOSIS — R413 Other amnesia: Secondary | ICD-10-CM | POA: Diagnosis not present

## 2022-12-02 DIAGNOSIS — C44329 Squamous cell carcinoma of skin of other parts of face: Secondary | ICD-10-CM | POA: Diagnosis not present

## 2022-12-02 DIAGNOSIS — D485 Neoplasm of uncertain behavior of skin: Secondary | ICD-10-CM | POA: Diagnosis not present

## 2022-12-02 DIAGNOSIS — C44722 Squamous cell carcinoma of skin of right lower limb, including hip: Secondary | ICD-10-CM | POA: Diagnosis not present

## 2022-12-23 DIAGNOSIS — N390 Urinary tract infection, site not specified: Secondary | ICD-10-CM | POA: Diagnosis not present

## 2022-12-23 DIAGNOSIS — M810 Age-related osteoporosis without current pathological fracture: Secondary | ICD-10-CM | POA: Diagnosis not present

## 2022-12-23 DIAGNOSIS — E46 Unspecified protein-calorie malnutrition: Secondary | ICD-10-CM | POA: Diagnosis not present

## 2023-01-08 ENCOUNTER — Telehealth: Payer: Self-pay | Admitting: Orthopaedic Surgery

## 2023-01-08 NOTE — Telephone Encounter (Signed)
Patient called in stating she has having severe back pain and would like to try and come in next week if possible I offered her next avail on 03/12  stated that will not work for her, please advise

## 2023-01-09 ENCOUNTER — Telehealth: Payer: Self-pay | Admitting: Orthopaedic Surgery

## 2023-01-09 NOTE — Telephone Encounter (Signed)
I worked patient in to be seen Tuesday, March 5 at 3:30p. I left voicemail for patient advising and asked for return call if she is unable to make this appointment.

## 2023-01-09 NOTE — Telephone Encounter (Signed)
noted 

## 2023-01-09 NOTE — Telephone Encounter (Signed)
I called and talked to the pt. She has had this problem for two years. She wanted to know how long her wait for dr. Lorin Mercy will be. I advised her that unfortunately we wont know if she will have a wait until we see how his clinic is going but we will have restrooms close by if she needs to use one. She stated understanding

## 2023-01-09 NOTE — Telephone Encounter (Signed)
Patient states she has intestinal problems and she is concerned about her Bowels and not being able to control tham. She just wanted to let you know she can't wait long periods of time.Caitlyn [name]

## 2023-01-12 DIAGNOSIS — L578 Other skin changes due to chronic exposure to nonionizing radiation: Secondary | ICD-10-CM | POA: Diagnosis not present

## 2023-01-12 DIAGNOSIS — L244 Irritant contact dermatitis due to drugs in contact with skin: Secondary | ICD-10-CM | POA: Diagnosis not present

## 2023-01-12 DIAGNOSIS — C44722 Squamous cell carcinoma of skin of right lower limb, including hip: Secondary | ICD-10-CM | POA: Diagnosis not present

## 2023-01-12 DIAGNOSIS — L923 Foreign body granuloma of the skin and subcutaneous tissue: Secondary | ICD-10-CM | POA: Diagnosis not present

## 2023-01-12 DIAGNOSIS — D485 Neoplasm of uncertain behavior of skin: Secondary | ICD-10-CM | POA: Diagnosis not present

## 2023-01-13 ENCOUNTER — Ambulatory Visit: Payer: Medicare Other | Admitting: Orthopaedic Surgery

## 2023-01-26 DIAGNOSIS — C44722 Squamous cell carcinoma of skin of right lower limb, including hip: Secondary | ICD-10-CM | POA: Diagnosis not present

## 2023-01-30 ENCOUNTER — Ambulatory Visit: Payer: Medicare Other | Admitting: Orthopaedic Surgery

## 2023-02-02 DIAGNOSIS — C4492 Squamous cell carcinoma of skin, unspecified: Secondary | ICD-10-CM | POA: Diagnosis not present

## 2023-02-11 DIAGNOSIS — C4492 Squamous cell carcinoma of skin, unspecified: Secondary | ICD-10-CM | POA: Diagnosis not present

## 2023-02-11 DIAGNOSIS — D485 Neoplasm of uncertain behavior of skin: Secondary | ICD-10-CM | POA: Diagnosis not present

## 2023-02-11 DIAGNOSIS — C44722 Squamous cell carcinoma of skin of right lower limb, including hip: Secondary | ICD-10-CM | POA: Diagnosis not present

## 2023-02-20 ENCOUNTER — Ambulatory Visit: Payer: Medicare Other | Admitting: Orthopaedic Surgery

## 2023-03-06 ENCOUNTER — Ambulatory Visit: Payer: Medicare Other | Admitting: Orthopaedic Surgery

## 2023-03-11 ENCOUNTER — Telehealth: Payer: Self-pay

## 2023-03-11 NOTE — Telephone Encounter (Signed)
Pt called today stating she really needs to speak with you. She said she was a patient of yours from [place] and you are the only one who will help her. She has been experiencing diarrhea for 3 weeks now. Having 9 episodes a day. She is having to use multiple pads. Pt stated she is getting tired of this and needs your help.Phone: [phone]

## 2023-03-12 DIAGNOSIS — C44722 Squamous cell carcinoma of skin of right lower limb, including hip: Secondary | ICD-10-CM | POA: Diagnosis not present

## 2023-03-12 NOTE — Telephone Encounter (Signed)
I called and spoke with patient today by phone.  She had stopped taking Linzess.  Had constipation for the past 2 weeks with overflow diarrhea.  Started Linzess again 3 days ago.  Symptoms are a little better today.  I encouraged her to continue Linzess 145 mcg once daily every day for chronic constipation with overflow diarrhea.  She has follow-up appointment with Dr. Lorenso Quarry at [facility] 03/24/2023.  I encouraged her to keep that appointment.

## 2023-03-19 DIAGNOSIS — C44722 Squamous cell carcinoma of skin of right lower limb, including hip: Secondary | ICD-10-CM | POA: Diagnosis not present

## 2023-04-09 DIAGNOSIS — C4492 Squamous cell carcinoma of skin, unspecified: Secondary | ICD-10-CM | POA: Diagnosis not present

## 2023-04-10 ENCOUNTER — Ambulatory Visit: Payer: Medicare Other | Admitting: Orthopaedic Surgery

## 2023-04-13 DIAGNOSIS — C44722 Squamous cell carcinoma of skin of right lower limb, including hip: Secondary | ICD-10-CM | POA: Diagnosis not present

## 2023-04-13 DIAGNOSIS — Z85828 Personal history of other malignant neoplasm of skin: Secondary | ICD-10-CM | POA: Diagnosis not present

## 2023-04-15 ENCOUNTER — Other Ambulatory Visit: Payer: Self-pay | Admitting: Orthopaedic Surgery

## 2023-04-15 ENCOUNTER — Telehealth: Payer: Self-pay | Admitting: Orthopaedic Surgery

## 2023-04-15 MED ORDER — TRAMADOL HCL 50 MG PO TABS: 50.0000 mg | ORAL_TABLET | Freq: Two times a day (BID) | ORAL | 0 refills | Status: DC | PRN

## 2023-04-15 NOTE — Telephone Encounter (Signed)
Patient called advised her Rt knee is swollen and hot. Patient asked if she can get a Rx for Tramadol? Patient said she is having trouble walking. The number to contact patient is [phone]

## 2023-04-16 DIAGNOSIS — S81801A Unspecified open wound, right lower leg, initial encounter: Secondary | ICD-10-CM | POA: Diagnosis not present

## 2023-04-16 NOTE — Telephone Encounter (Signed)
Please advise 

## 2023-04-17 ENCOUNTER — Ambulatory Visit: Payer: Medicare Other | Admitting: Orthopaedic Surgery

## 2023-04-17 DIAGNOSIS — M1711 Unilateral primary osteoarthritis, right knee: Secondary | ICD-10-CM

## 2023-04-17 DIAGNOSIS — M25561 Pain in right knee: Secondary | ICD-10-CM

## 2023-04-17 MED ORDER — LIDOCAINE HCL 1 % IJ SOLN
2.0000 mL | INTRAMUSCULAR | Status: AC | PRN
Start: 2023-04-17 — End: 2023-04-17
  Administered 2023-04-17: 2 mL

## 2023-04-17 MED ORDER — METHYLPREDNISOLONE ACETATE 40 MG/ML IJ SUSP
40.0000 mg | INTRAMUSCULAR | Status: AC | PRN
Start: 2023-04-17 — End: 2023-04-17
  Administered 2023-04-17: 40 mg via INTRA_ARTICULAR

## 2023-04-17 MED ORDER — BUPIVACAINE HCL 0.5 % IJ SOLN
2.0000 mL | INTRAMUSCULAR | Status: AC | PRN
Start: 2023-04-17 — End: 2023-04-17
  Administered 2023-04-17: 2 mL via INTRA_ARTICULAR

## 2023-04-17 NOTE — Progress Notes (Signed)
Office Visit Note   Patient: Caitlyn [name]           Date of Birth: Nov 15, 1929           MRN: [id number] Visit Date: 04/17/2023              Requested by: Catha Gosselin, MD [address] [place],  [place] [address] PCP: Catha Gosselin, MD   Assessment & Plan: Visit Diagnoses:  1. Unilateral primary osteoarthritis, right knee     Plan: Impression is probable right knee arthritis flareup.  We discussed proceeding with right knee aspiration and cortisone injection.  Is able to aspirate approximately 15 cc of serosanguineous fluid from the right knee.  She will follow-up with [place] as needed. Follow-Up Instructions: Return if symptoms worsen or fail to improve.   Orders:  Orders Placed This Encounter  Procedures   Large Joint Inj   No orders of the defined types were placed in this encounter.     Procedures: Large Joint Inj: R knee on 04/17/2023 11:41 AM Indications: pain Details: 22 G needle  Arthrogram: No  Medications: 40 mg methylPREDNISolone acetate 40 MG/ML; 2 mL lidocaine 1 %; 2 mL bupivacaine 0.5 % Aspirate: 15 mL Consent was given by the patient. Patient was prepped and draped in the usual sterile fashion.       Clinical Data: No additional findings.   Subjective: Chief Complaint  Patient presents with   Right Knee - Pain    HPI patient is a pleasant 89 year old female who comes in today with right knee pain.  She notes that this recently began after developing infection in her lower leg following multiple surgeries for skin cancer.  Pain is to the entire knee but worse with walking.  She is not taking anything for pain.  She denies any fevers or chills.  She tells me she was seen yesterday for this possible infection to her right lower leg and antibiotics were prescribed.  She has not picked these up yet.  Review of Systems as detailed in HPI.  All others reviewed and are negative.   Objective: Vital Signs: There were no vitals taken for this  visit.  Physical Exam well-developed well-nourished female no acute distress.  Alert and oriented x 3.  Ortho Exam right knee exam shows small effusion.  Range of motion 0 to 100 degrees.  Medial and lateral joint line tenderness.  Minimal warmth and erythema.  She is neurovascular intact distally.  Specialty Comments:  No specialty comments available.  Imaging: No new imaging   PMFS History: Patient Active Problem List   Diagnosis Date Noted   Unilateral primary osteoarthritis, left knee 08/14/2021   Lumbar foraminal stenosis 11/13/2020   Unilateral primary osteoarthritis, right knee 05/17/2019   Cervical facet syndrome 04/28/2019   History of fusion of cervical spine 03/10/2018   Neck pain 03/02/2018   Age-related osteoporosis without current pathological fracture 01/01/2018   Abnormal CXR 05/25/2017   Hypertensive retinopathy 12/04/2015   Chest pain with moderate risk for cardiac etiology 10/15/2015   Scoliosis deformity of spine 10/15/2015   Hyperlipidemia 10/16/2013   Essential hypertension 10/16/2013   Mitral valve prolapse 10/16/2013   Past Medical History:  Diagnosis Date   AC (acromioclavicular) joint bone spurs 2008   Back   Anemia    Arthritis    Blood transfusion    Bronchitis    COPD (chronic obstructive pulmonary disease) (HCC)    GERD (gastroesophageal reflux disease)    uses  tums   Hepatitis    1970's   Hyperlipidemia    Hypertension    Mitral valve prolapse 10/16/2013   MVP (mitral valve prolapse)    with mild to mod MR and TR 04/2012; followed by Dr. Alanda Amass [facility])   Osteoporosis    Sleep apnea    no treatment at this time, had sleep study apprx 8 years ago    Family History  Problem Relation Age of Onset   Alzheimer's disease Mother    Alzheimer's disease Father     Past Surgical History:  Procedure Laterality Date   ABDOMINAL HYSTERECTOMY  1964   ANTERIOR CERVICAL DECOMP/DISCECTOMY FUSION     HEMORRHOID SURGERY  1969, 1971   HERNIA  REPAIR     INGUINAL HERNIA REPAIR  06/16/2012   Procedure: HERNIA REPAIR INGUINAL ADULT;  Surgeon: Lodema Pilot, DO;  Location: [facility] OR;  Service: General;  Laterality: Right;   KNEE ARTHROSCOPY  2007 & 2008   NM MYOCAR PERF WALL MOTION  05/27/2011   normal   SPINE SURGERY     TONGUE BIOPSY  2000   US ECHOCARDIOGRAPHY  06/07/2012   mild concentric LVH,LA mod. dilated,RA is mild to mod. dilated, mild MVP,prolapse of the posterior leaflet,mild mitral annular ca+,mild to mod MR & TR, AOV mildly sclerotic, aortic root sclerosis/ca+, insignificant pericardial effusion.   UVULECTOMY     Social History   Occupational History   Not on file  Tobacco Use   Smoking status: Never   Smokeless tobacco: Never  Substance and Sexual Activity   Alcohol use: Yes    Comment: occassional   Drug use: No   Sexual activity: Not on file

## 2023-04-27 ENCOUNTER — Other Ambulatory Visit: Payer: Self-pay | Admitting: Cardiovascular Disease

## 2023-04-28 ENCOUNTER — Telehealth: Payer: Self-pay | Admitting: Cardiovascular Disease

## 2023-04-28 NOTE — Telephone Encounter (Signed)
Left message for pt to call.

## 2023-04-28 NOTE — Progress Notes (Deleted)
   Cardiology Clinic Note   Date: 04/28/2023 ID: Bellamae, Bignell December 25, 1929, MRN [id number]  Primary Cardiologist:  Thurmon Fair, MD  Patient Profile    Caitlyn [name] is a 89 y.o. female who presents to the clinic today for ***    Past medical history significant for: MVP. Echo 06/07/2012: Normal LV function.  Mild LVH.  Grade I DD.  Moderate BAE.  Mild MVP of posterior leaflet, mild MAC, mild to moderate MR.  Mild to moderate TR.  Mildly sclerotic aortic valve. Palpitations. Hypertension. Hyperlipidemia. Thoracic scoliosis.     History of Present Illness    Caitlyn [name] was previously followed by Dr. Alanda Amass.  She was first evaluated by Dr. Royann Shivers on 10/16/2013.  She had no cardiovascular complaints at that time.  She was evaluated in the office by Corine Shelter, PA-C on 10/15/2015 with complaints of chest discomfort described as an ache and exertional fatigue.  Nuclear stress testing showed a medium size, moderate intensity fixed inferior apical defect favored to be diaphragmatic attenuation, no ischemia.  Patient was last seen in the office by Dr. Royann Shivers on 09/23/2022 for routine follow-up.  She was stable from a cardiac standpoint and no changes were made.  Patient contacted the office on 04/28/2023 with complaints of elevated BP and slight dizziness.  Per triage notes: "she reports the following blood pressure readings 147/91/92, 146/87/78 and 152/115/93. She has an appointment tomorrow and she was ask to bring her blood pressure log and blood pressure machine to her appointment tomorrow. She reports she is under a lot of pressure, she is having physical problems, otherwise she reports no symptoms from blood pressure."  Today, patient ***  Hypertension. BP today *** Patient denies headaches, dizziness or vision changes. Continue ***  MVP.  Echo 2013 showed mild prolapse of posterior mitral valve leaflet with mild to moderate MR.   Patient*** Palpitations.***   ROS: All other systems reviewed and are otherwise negative except as noted in History of Present Illness.  Studies Reviewed       ECG personally reviewed by me today: ***  No significant changes from ***  Risk Assessment/Calculations    {Does this patient have ATRIAL FIBRILLATION?:[phone]} No BP recorded.  {Refresh Note OR Click here to enter BP  :1}***        Physical Exam    VS:  There were no vitals taken for this visit. , BMI There is no height or weight on file to calculate BMI.  GEN: Well nourished, well developed, in no acute distress. Neck: No JVD or carotid bruits. Cardiac: *** RRR. No murmurs. No rubs or gallops.   Respiratory:  Respirations regular and unlabored. Clear to auscultation without rales, wheezing or rhonchi. GI: Soft, nontender, nondistended. Extremities: Radials/DP/PT 2+ and equal bilaterally. No clubbing or cyanosis. No edema ***  Skin: Warm and dry, no rash. Neuro: Strength intact.  Assessment & Plan   ***  Disposition: ***     {Are you ordering a CV Procedure (e.g. stress test, cath, DCCV, TEE, etc)?   Press F2        :[id number]}   Signed, Etta Grandchild. Graeson Nouri, DNP, NP-C

## 2023-04-28 NOTE — Telephone Encounter (Signed)
Pt c/o BP issue: STAT if pt c/o blurred vision, one-sided weakness or slurred speech  1. What are your last 5 BP readings? 147/91   2. Are you having any other symptoms (ex. Dizziness, headache, blurred vision, passed out)? Slight Dizziness  3. What is your BP issue? Pt stated that's very high for her and she'd like a callback to discuss.

## 2023-04-28 NOTE — Telephone Encounter (Signed)
Spoke with pt, she reports the following blood pressure readings 147/91/92, 146/87/78 and 152/115/93. She has an appointment tomorrow and she was ask to bring her blood pressure log and blood pressure machine to her appointment tomorrow. She reports she is under a lot of pressure, she is having physical problems, otherwise she reports no symptoms from blood pressure.

## 2023-04-29 ENCOUNTER — Telehealth: Payer: Self-pay | Admitting: Cardiovascular Disease

## 2023-04-29 ENCOUNTER — Ambulatory Visit: Payer: Medicare Other | Admitting: Student

## 2023-04-29 NOTE — Telephone Encounter (Signed)
Left voicemail for patient to return call to office. 

## 2023-04-29 NOTE — Telephone Encounter (Signed)
Pt c/o medication issue:  1. Name of Medication: hydrochlorothiazide (MICROZIDE) 12.5 MG capsule   2. How are you currently taking this medication (dosage and times per day)?  TAKE 1 CAPSULE BY MOUTH DAILY       3. Are you having a reaction (difficulty breathing--STAT)? No  4. What is your medication issue? Pt would like a callback to discuss this medication. She stated there's been some confusion at the pharmacy so she'd like to discuss it and find out if she should get this refilled or not. Please advise

## 2023-04-30 NOTE — Telephone Encounter (Signed)
Call too patient.  LVM to please call office

## 2023-04-30 NOTE — Telephone Encounter (Signed)
Patient states question for hydrochlorothiazide. She states when BP was high and she's called so many doctors.  She is very confused and cannot say why she is calling.  Took several times to discuss hydrochlorothiazide before she remembers she called for that.  She states the pharmacy does not have the medication. Call to pharmacy.  They have the new RX However, patient picked up a 90 day supply on June 4th. Call to patient. Advised to look for the medication. She states "I am very organized and I haven't lost it" . Advised again to look for medication.  She wants to talk about other issues with pharmacy and labels on her prescriptions.  She is very confused about things.  Advised if she cannot find her prescription she could get the next one from pharmacy but she may have to pay for it.  She discusses random things.  Advised she needs the medication and if she cannot find the bottle then she needs to call pharmacy to see how much the new prescription will be.  She states OK

## 2023-05-13 DIAGNOSIS — H6121 Impacted cerumen, right ear: Secondary | ICD-10-CM | POA: Diagnosis not present

## 2023-05-22 ENCOUNTER — Other Ambulatory Visit: Payer: Self-pay | Admitting: Cardiovascular Disease

## 2023-06-05 ENCOUNTER — Telehealth: Payer: Self-pay | Admitting: Orthopaedic Surgery

## 2023-06-05 NOTE — Telephone Encounter (Signed)
Pt would like a call about starting exercise for right knee please advise

## 2023-06-05 NOTE — Telephone Encounter (Signed)
I called patient. She states that her entire body is falling apart. She is having bilateral leg weakness, and states that the right knee is still giving her a fit. She would like to know if you feel that she should exercise and if that will help? She does not want physical therapy. She states that she does not know if she should be exercising or should be laying down and staying off of the leg. She has had 2 falls. She did not want to make an appointment, just wants to know how you feel about her exercising and what is best for her to do.  Please advise. Patient aware you are not in the office today and that she will not get a response until next week.

## 2023-06-09 DIAGNOSIS — R194 Change in bowel habit: Secondary | ICD-10-CM | POA: Diagnosis not present

## 2023-06-10 ENCOUNTER — Telehealth: Payer: Self-pay

## 2023-06-10 NOTE — Telephone Encounter (Signed)
Patient left message asking Inetta Fermo to please return her call-she needs to speak with you before she calls her doctor-she can be reached [phone].

## 2023-06-15 DIAGNOSIS — I872 Venous insufficiency (chronic) (peripheral): Secondary | ICD-10-CM | POA: Diagnosis not present

## 2023-06-15 DIAGNOSIS — A09 Infectious gastroenteritis and colitis, unspecified: Secondary | ICD-10-CM | POA: Diagnosis not present

## 2023-06-15 DIAGNOSIS — D485 Neoplasm of uncertain behavior of skin: Secondary | ICD-10-CM | POA: Diagnosis not present

## 2023-06-15 DIAGNOSIS — R197 Diarrhea, unspecified: Secondary | ICD-10-CM | POA: Diagnosis not present

## 2023-06-15 DIAGNOSIS — L981 Factitial dermatitis: Secondary | ICD-10-CM | POA: Diagnosis not present

## 2023-06-26 ENCOUNTER — Other Ambulatory Visit: Payer: Self-pay | Admitting: Internal Medicine

## 2023-06-26 DIAGNOSIS — R194 Change in bowel habit: Secondary | ICD-10-CM

## 2023-06-26 DIAGNOSIS — R197 Diarrhea, unspecified: Secondary | ICD-10-CM

## 2023-07-02 ENCOUNTER — Ambulatory Visit
Admission: RE | Admit: 2023-07-02 | Discharge: 2023-07-02 | Disposition: A | Discharge status: Home / Self Care | Payer: Medicare Other | Source: Ambulatory Visit | Attending: Internal Medicine | Admitting: Internal Medicine

## 2023-07-02 DIAGNOSIS — K5730 Diverticulosis of large intestine without perforation or abscess without bleeding: Secondary | ICD-10-CM | POA: Diagnosis not present

## 2023-07-02 DIAGNOSIS — K8020 Calculus of gallbladder without cholecystitis without obstruction: Secondary | ICD-10-CM | POA: Diagnosis not present

## 2023-07-02 DIAGNOSIS — N200 Calculus of kidney: Secondary | ICD-10-CM | POA: Diagnosis not present

## 2023-07-02 DIAGNOSIS — R109 Unspecified abdominal pain: Secondary | ICD-10-CM | POA: Diagnosis not present

## 2023-07-02 DIAGNOSIS — R197 Diarrhea, unspecified: Secondary | ICD-10-CM

## 2023-07-02 DIAGNOSIS — R194 Change in bowel habit: Secondary | ICD-10-CM

## 2023-07-02 MED ORDER — IOPAMIDOL (ISOVUE-300) INJECTION 61%
200.0000 mL | Freq: Once | INTRAVENOUS | Status: AC | PRN
  Administered 2023-07-02: 80 mL via INTRAVENOUS

## 2023-07-06 DIAGNOSIS — H401121 Primary open-angle glaucoma, left eye, mild stage: Secondary | ICD-10-CM | POA: Diagnosis not present

## 2023-07-06 DIAGNOSIS — H5213 Myopia, bilateral: Secondary | ICD-10-CM | POA: Diagnosis not present

## 2023-07-06 DIAGNOSIS — H348312 Tributary (branch) retinal vein occlusion, right eye, stable: Secondary | ICD-10-CM | POA: Diagnosis not present

## 2023-07-06 DIAGNOSIS — H35033 Hypertensive retinopathy, bilateral: Secondary | ICD-10-CM | POA: Diagnosis not present

## 2023-07-06 DIAGNOSIS — H401112 Primary open-angle glaucoma, right eye, moderate stage: Secondary | ICD-10-CM | POA: Diagnosis not present

## 2023-07-07 DIAGNOSIS — D485 Neoplasm of uncertain behavior of skin: Secondary | ICD-10-CM | POA: Diagnosis not present

## 2023-07-07 DIAGNOSIS — L304 Erythema intertrigo: Secondary | ICD-10-CM | POA: Diagnosis not present

## 2023-07-07 DIAGNOSIS — L981 Factitial dermatitis: Secondary | ICD-10-CM | POA: Diagnosis not present

## 2023-07-15 DIAGNOSIS — E559 Vitamin D deficiency, unspecified: Secondary | ICD-10-CM | POA: Diagnosis not present

## 2023-07-15 DIAGNOSIS — D509 Iron deficiency anemia, unspecified: Secondary | ICD-10-CM | POA: Diagnosis not present

## 2023-07-15 DIAGNOSIS — M810 Age-related osteoporosis without current pathological fracture: Secondary | ICD-10-CM | POA: Diagnosis not present

## 2023-07-15 DIAGNOSIS — E46 Unspecified protein-calorie malnutrition: Secondary | ICD-10-CM | POA: Diagnosis not present

## 2023-07-15 DIAGNOSIS — Z0000 Encounter for general adult medical examination without abnormal findings: Secondary | ICD-10-CM | POA: Diagnosis not present

## 2023-07-15 DIAGNOSIS — E785 Hyperlipidemia, unspecified: Secondary | ICD-10-CM | POA: Diagnosis not present

## 2023-07-15 DIAGNOSIS — I10 Essential (primary) hypertension: Secondary | ICD-10-CM | POA: Diagnosis not present

## 2023-07-15 DIAGNOSIS — K219 Gastro-esophageal reflux disease without esophagitis: Secondary | ICD-10-CM | POA: Diagnosis not present

## 2023-07-17 DIAGNOSIS — Z0000 Encounter for general adult medical examination without abnormal findings: Secondary | ICD-10-CM | POA: Diagnosis not present

## 2023-07-17 DIAGNOSIS — N3289 Other specified disorders of bladder: Secondary | ICD-10-CM | POA: Diagnosis not present

## 2023-07-27 ENCOUNTER — Telehealth: Payer: Self-pay | Admitting: Physician Assistant

## 2023-07-27 ENCOUNTER — Telehealth: Payer: Self-pay

## 2023-07-27 NOTE — Telephone Encounter (Signed)
Patient called in she wanted to speak to Mrs. Gerre Pebbles or the nurse.

## 2023-07-27 NOTE — Telephone Encounter (Signed)
Patient is signing on to see tina- 08/31/23 @ 2:00.

## 2023-07-28 ENCOUNTER — Other Ambulatory Visit: Payer: Self-pay

## 2023-07-28 DIAGNOSIS — N3289 Other specified disorders of bladder: Secondary | ICD-10-CM | POA: Insufficient documentation

## 2023-07-28 NOTE — Progress Notes (Signed)
Caitlyn Amy, PA-C [address]  [address]  [place], [place] [address]  Main: [phone]  Fax: [phone]   Gastroenterology Consultation  Referring Provider:     Catha Gosselin, MD Primary Care Physician:  Catha Gosselin, MD Primary Gastroenterologist:  Caitlyn Amy, PA-C  Reason for Consultation:     Diarrhea, Incontinence, Constipation, Rectal Impaction        HPI:   Caitlyn [name] is a 89 y.o. y/o female referred for consultation & management  by Catha Gosselin, MD.    Patient has a long history of severe scoliosis, constipation, rectal impactions, and overflow diarrhea.  I previously saw her at [facility] gastroenterology.  She also saw Dr. Levora Angel and Dr. Lorenso Quarry.  She is requesting to transfer GI care to our office.  She was doing well on Linzess 72 daily in the past.  She has tried multiple constipation treatments in the past with little benefit.  She has moderate dementia.  When she starts having overflow diarrhea from constipation, then she starts taking OTC Imodium or Kaopectate which makes the problem worse.  Was last seen at [facility] long ED for rectal disimpaction 3 years ago.  Patient states she has had worsening diarrhea in the past 4 to 6 weeks.  Apparently had negative stool studies through [facility].  She is having to wear a pad due to fecal incontinence.  Interestingly, she has not yet had a bowel movement today.  Abdominal pelvic CT done 07/07/2023 showed fecal impaction in the rectum.  No bowel obstruction.  Incidental sigmoid diverticulosis and cholelithiasis (asymptomatic).  Multiple nonobstructing left renal calculi.  Mild left renal hydronephrosis.  Apparent linear and nodular thickening of the right posterior bladder wall.  Further evaluation with cystoscopy was recommended.  She has not seen a urologist.   Past Medical History:  Diagnosis Date   AC (acromioclavicular) joint bone spurs 2008   Back   Anemia    Arthritis    Blood transfusion     Bronchitis    COPD (chronic obstructive pulmonary disease) (HCC)    GERD (gastroesophageal reflux disease)    uses tums   Hepatitis    1970's   Hyperlipidemia    Hypertension    Mitral valve prolapse 10/16/2013   MVP (mitral valve prolapse)    with mild to mod MR and TR 04/2012; followed by Dr. Alanda Amass [facility])   Osteoporosis    Sleep apnea    no treatment at this time, had sleep study apprx 8 years ago    Past Surgical History:  Procedure Laterality Date   ABDOMINAL HYSTERECTOMY  1964   ANTERIOR CERVICAL DECOMP/DISCECTOMY FUSION     HEMORRHOID SURGERY  1969, 1971   HERNIA REPAIR     INGUINAL HERNIA REPAIR  06/16/2012   Procedure: HERNIA REPAIR INGUINAL ADULT;  Surgeon: Lodema Pilot, DO;  Location: [facility] OR;  Service: General;  Laterality: Right;   KNEE ARTHROSCOPY  2007 & 2008   NM MYOCAR PERF WALL MOTION  05/27/2011   normal   SPINE SURGERY     TONGUE BIOPSY  2000   US ECHOCARDIOGRAPHY  06/07/2012   mild concentric LVH,LA mod. dilated,RA is mild to mod. dilated, mild MVP,prolapse of the posterior leaflet,mild mitral annular ca+,mild to mod MR & TR, AOV mildly sclerotic, aortic root sclerosis/ca+, insignificant pericardial effusion.   UVULECTOMY      Prior to Admission medications   Medication Sig Start Date End Date Taking? Authorizing Provider  acetaminophen (TYLENOL) 325 MG tablet  Take 650 mg by mouth every 6 (six) hours as needed. Patient not taking: Reported on 09/23/2022    [provider]  antiseptic oral rinse (BIOTENE) LIQD 15 mLs by Mouth Rinse route as needed for dry mouth.    [provider]  aspirin 81 MG tablet Take 81 mg by mouth daily.    [provider]  atorvastatin (LIPITOR) 20 MG tablet TAKE 1 TABLET BY MOUTH DAILY 05/22/23   Croitoru, Mihai, MD  brimonidine (ALPHAGAN P) 0.1 % SOLN 1 drop into R eye only Ophthalmic twice a day    [provider]  Calcium Carbonate Antacid (TUMS PO) Take 1 tablet by mouth daily. Patient not  taking: Reported on 04/08/2022    [provider]  Cholecalciferol (VITAMIN D3) 2000 UNITS TABS Take 1 capsule by mouth daily.     [provider]  cycloSPORINE (RESTASIS) 0.05 % ophthalmic emulsion Place 1 drop into both eyes 2 (two) times daily.     [provider]  denosumab (PROLIA) 60 MG/ML SOSY injection Inject 60 mg into the skin every 6 (six) months.    [provider]  Dentifrices (BIOTENE DRY MOUTH CARE DT) Mouth/Throat    [provider]  Docusate Sodium (DSS) 100 MG CAPS 1 capsule as needed Orally Once a day    [provider]  doxycycline (VIBRAMYCIN) 100 MG capsule Take 100 mg by mouth 2 (two) times daily. 04/16/23   [provider]  hydrochlorothiazide (MICROZIDE) 12.5 MG capsule TAKE 1 CAPSULE BY MOUTH DAILY 04/27/23   Croitoru, Mihai, MD  Ibuprofen (ADVIL PO) Take 1 capsule by mouth daily as needed.    [provider]  Iron-Vitamins (GERITOL COMPLETE) TABS Take 1 tablet by mouth daily.    [provider]  latanoprost (XALATAN) 0.005 % ophthalmic solution Place 1 drop into the right eye daily. 04/19/14   [provider]  LINZESS 145 MCG CAPS capsule Take 145 mcg by mouth daily. 06/17/22   [provider]  OVER THE COUNTER MEDICATION Take 1 Dose by mouth daily. CBD OIL Patient not taking: Reported on 09/23/2022    [provider]  pantoprazole (PROTONIX) 40 MG tablet Take 40 mg by mouth daily. 06/23/22   [provider]  timolol (TIMOPTIC) 0.5 % ophthalmic solution Place 1 drop into both eyes 2 (two) times daily. 09/15/22   [provider]  traMADol (ULTRAM) 50 MG tablet Take 1 tablet (50 mg total) by mouth every 12 (twelve) hours as needed. 04/15/23   Eldred Manges, MD    Family History  Problem Relation Age of Onset   Alzheimer's disease Mother    Alzheimer's disease Father      Social History   Tobacco Use   Smoking status: Never   Smokeless tobacco: Never   Substance Use Topics   Alcohol use: Yes    Comment: occassional   Drug use: No    Allergies as of 07/29/2023 - Review Complete 07/29/2023  Allergen Reaction Noted   Fosamax [alendronate sodium] Nausea Only 01/05/2018   Amoxicillin-pot clavulanate Other (See Comments) 10/24/2020   Crestor [rosuvastatin] Other (See Comments) 10/24/2020   Doxycycline Other (See Comments) 10/24/2020   Gabapentin Other (See Comments) 10/24/2020   Latex Hives 06/16/2012   Statins  06/04/2012    Review of Systems:    All systems reviewed and negative except where noted in HPI.   Physical Exam:  BP 130/74   Pulse (!) 105   Temp 98.2 F (36.8  C)   Ht 5\' 4"  (1.626 m)   Wt 103 lb 9.6 oz (47 kg)   BMI 17.78 kg/m  No LMP recorded. Patient has had a hysterectomy.  General:   Thin, feeble, elderly, frail female, pleasant and cooperative in NAD. Lungs:  Respirations even and unlabored.  Clear throughout to auscultation.   No wheezes, crackles, or rhonchi. No acute distress. Heart:  Regular rate and rhythm; no murmurs, clicks, rubs, or gallops. Abdomen:  Normal bowel sounds.  No bruits.  Soft, and non-distended without masses, hepatosplenomegaly or hernias noted.  No Tenderness.  No guarding or rebound tenderness.    Neurologic:  Alert and oriented x3; moderate memory impairment noted.  Walks with a cane.  Severe scoliosis.  Is able to get on exam table with my help. Rectal: Weak anal sphincter tone.  No significant external hemorrhoids.  There was a lot of soft brown stool in the rectal vault.  No hard stools or masses.  No gross blood.  I was able to get a small amount of soft brown stool out with attempted manual disimpaction.  Stool was very soft and not hard.  Not watery. Anoscopy performed: Soft stool in the rectal vault.  No masses.  Non-prolapsing Internal hemorrhoids noted.  No rectal pain or tenderness.  Imaging Studies: CT ABDOMEN PELVIS W CONTRAST  Result Date: 07/07/2023 CLINICAL DATA:   Right-sided abdominal pain. Constipation and diarrhea. EXAM: CT ABDOMEN AND PELVIS WITH CONTRAST TECHNIQUE: Multidetector CT imaging of the abdomen and pelvis was performed using the standard protocol following bolus administration of intravenous contrast. RADIATION DOSE REDUCTION: This exam was performed according to the departmental dose-optimization program which includes automated exposure control, adjustment of the mA and/or kV according to patient size and/or use of iterative reconstruction technique. CONTRAST:  80mL ISOVUE-300 IOPAMIDOL (ISOVUE-300) INJECTION 61% COMPARISON:  CT of the abdomen pelvis dated 02/03/2020. FINDINGS: Evaluation is limited due to paucity of intra-abdominal fat as well as scoliosis. Lower chest: The visualized lung bases are clear. No intra-abdominal free air or free fluid. Hepatobiliary: The liver is unremarkable. There is gallstone. No pericholecystic fluid. Pancreas: The pancreas is unremarkable as visualized. Spleen: Normal in size without focal abnormality. Adrenals/Urinary Tract: The adrenal glands are unremarkable. Multiple nonobstructing left renal calculi measure up to 6 mm. There is mild left renal hydronephrosis. The right kidney is unremarkable. There is symmetric enhancement and excretion of contrast by both kidneys. Small left renal cyst. The urinary bladder is partially distended. There is apparent nodular linear thickening of the right posterior bladder wall (59/2 and coronal 71/6) likely representing a fold. A bladder lesion is not excluded. Further evaluation with cystoscopy is recommended. Stomach/Bowel: There is large stool in the rectal vault consistent with fecal impaction. There is sigmoid diverticulosis with muscular hypertrophy. No definite active inflammation. There is no bowel obstruction. The appendix is not visualized with certainty. No inflammatory changes identified in the right lower quadrant. Vascular/Lymphatic: Advanced aortoiliac atherosclerotic  disease. The IVC is unremarkable. No portal venous gas. There is no adenopathy. Reproductive: Hysterectomy.  No adnexal masses. Other: None Musculoskeletal: Osteopenia with degenerative changes of the spine and scoliosis. No acute osseous pathology. IMPRESSION: 1. Fecal impaction in the rectum.  No bowel obstruction. 2. Sigmoid diverticulosis. 3. Cholelithiasis. 4. Multiple nonobstructing left renal calculi. Mild left renal hydronephrosis. 5. Apparent linear and nodular thickening of the right posterior bladder wall. Further evaluation with cystoscopy is recommended. 6.  Aortic Atherosclerosis (ICD10-I70.0). Electronically Signed   By: Ceasar Mons.D.  On: 07/07/2023 01:04    Assessment and Plan:   Merliah Pluchino is a 89 y.o. y/o female has been referred for:   Chronic idiopathic constipation exacerbated by severe scoliosis.  It appears that her rectal muscles are not strong enough to evacuate stool.  Stressed the importance of taking Linzess 72 mcg 1 tab every day consistently to treat chronic constipation.  Samples and prescription given.  I advised patient and her husband that she should not to take Imodium or Kaopectate.  2.   Chronic intermittent rectal impactions with overflow diarrhea  Recommend she go home and use fleets enema today and tomorrow to relieve rectal impaction with soft stool.  If that does not work, then we can give GoLytely prep colon purge.  ER precautions discussed.  3.   Abnormal CT scan of the bladder -bladder wall thickening.  I am referring to urologist for further evaluation.  Follow up in 1 month with TG.  Caitlyn Amy, PA-C

## 2023-07-29 ENCOUNTER — Encounter: Payer: Self-pay | Admitting: Physician Assistant

## 2023-07-29 ENCOUNTER — Ambulatory Visit: Payer: Medicare Other | Admitting: Physician Assistant

## 2023-07-29 VITALS — BP 130/74 | HR 105 | Temp 98.2°F | Ht 64.0 in | Wt 103.6 lb

## 2023-07-29 DIAGNOSIS — R9341 Abnormal radiologic findings on diagnostic imaging of renal pelvis, ureter, or bladder: Secondary | ICD-10-CM

## 2023-07-29 DIAGNOSIS — K5904 Chronic idiopathic constipation: Secondary | ICD-10-CM

## 2023-07-29 DIAGNOSIS — R197 Diarrhea, unspecified: Secondary | ICD-10-CM

## 2023-07-29 DIAGNOSIS — K5641 Fecal impaction: Secondary | ICD-10-CM | POA: Diagnosis not present

## 2023-07-29 MED ORDER — PEG 3350-KCL-NA BICARB-NACL 420 G PO SOLR
4000.0000 mL | Freq: Once | ORAL | 0 refills | Status: AC
Start: 2023-07-29 — End: 2023-07-29

## 2023-07-29 MED ORDER — LINACLOTIDE 72 MCG PO CAPS
72.0000 ug | ORAL_CAPSULE | Freq: Every day | ORAL | 3 refills | Status: DC
Start: 2023-07-29 — End: 2024-06-27

## 2023-07-29 NOTE — Patient Instructions (Addendum)
Treatment for Rectal Stool Impaction  GoLytely Prep:  Pick up Prescription from your pharmacy. Fill the GoLytely Jug with water to fill line per Instructions. Drink 8 ounces every 15 to 30 minutes until Impaction is resolved.   OTC Fleets Enemas: Purchase 2 Fleets Enemas Over the Counter. Use 1 Fleet Enema Today. If No Improvement in 6 hours, then Use a 2nd Fleet Enema.   For Long Term Treatment of Constipation (After Impaction has Cleared): -Take Linzess 1 capsule once daily every day.

## 2023-08-10 DIAGNOSIS — Z85828 Personal history of other malignant neoplasm of skin: Secondary | ICD-10-CM | POA: Diagnosis not present

## 2023-08-10 DIAGNOSIS — D485 Neoplasm of uncertain behavior of skin: Secondary | ICD-10-CM | POA: Diagnosis not present

## 2023-08-10 DIAGNOSIS — L981 Factitial dermatitis: Secondary | ICD-10-CM | POA: Diagnosis not present

## 2023-08-10 DIAGNOSIS — C44729 Squamous cell carcinoma of skin of left lower limb, including hip: Secondary | ICD-10-CM | POA: Diagnosis not present

## 2023-08-10 DIAGNOSIS — C44311 Basal cell carcinoma of skin of nose: Secondary | ICD-10-CM | POA: Diagnosis not present

## 2023-08-10 DIAGNOSIS — Z08 Encounter for follow-up examination after completed treatment for malignant neoplasm: Secondary | ICD-10-CM | POA: Diagnosis not present

## 2023-08-13 ENCOUNTER — Telehealth: Payer: Self-pay | Admitting: Orthopaedic Surgery

## 2023-08-13 ENCOUNTER — Other Ambulatory Visit: Payer: Self-pay | Admitting: Orthopaedic Surgery

## 2023-08-13 MED ORDER — TRAMADOL HCL 50 MG PO TABS: 50.0000 mg | ORAL_TABLET | Freq: Two times a day (BID) | ORAL | 0 refills | Status: AC | PRN

## 2023-08-13 NOTE — Telephone Encounter (Signed)
Called and advised pt.

## 2023-08-13 NOTE — Telephone Encounter (Signed)
Patient called and said that he gave her some Tramadol for pain but her medication expired. Can she get another prescription because her left hip is back in pain or do she needs to make another appointment to get it. CB#[phone]

## 2023-08-18 ENCOUNTER — Other Ambulatory Visit: Payer: Self-pay

## 2023-08-18 MED ORDER — ATORVASTATIN CALCIUM 20 MG PO TABS: 20.0000 mg | ORAL_TABLET | Freq: Every day | ORAL | 0 refills | Status: DC

## 2023-08-24 DIAGNOSIS — E559 Vitamin D deficiency, unspecified: Secondary | ICD-10-CM | POA: Insufficient documentation

## 2023-08-24 DIAGNOSIS — F0390 Unspecified dementia without behavioral disturbance: Secondary | ICD-10-CM | POA: Insufficient documentation

## 2023-08-24 DIAGNOSIS — K8020 Calculus of gallbladder without cholecystitis without obstruction: Secondary | ICD-10-CM | POA: Insufficient documentation

## 2023-08-24 DIAGNOSIS — D509 Iron deficiency anemia, unspecified: Secondary | ICD-10-CM | POA: Insufficient documentation

## 2023-08-24 DIAGNOSIS — G4719 Other hypersomnia: Secondary | ICD-10-CM | POA: Insufficient documentation

## 2023-08-24 DIAGNOSIS — G4730 Sleep apnea, unspecified: Secondary | ICD-10-CM | POA: Insufficient documentation

## 2023-08-24 DIAGNOSIS — H9193 Unspecified hearing loss, bilateral: Secondary | ICD-10-CM | POA: Insufficient documentation

## 2023-08-24 DIAGNOSIS — E46 Unspecified protein-calorie malnutrition: Secondary | ICD-10-CM | POA: Insufficient documentation

## 2023-08-24 DIAGNOSIS — M4120 Other idiopathic scoliosis, site unspecified: Secondary | ICD-10-CM | POA: Insufficient documentation

## 2023-08-24 DIAGNOSIS — K5900 Constipation, unspecified: Secondary | ICD-10-CM | POA: Insufficient documentation

## 2023-08-24 DIAGNOSIS — R609 Edema, unspecified: Secondary | ICD-10-CM | POA: Insufficient documentation

## 2023-08-24 DIAGNOSIS — H9190 Unspecified hearing loss, unspecified ear: Secondary | ICD-10-CM | POA: Insufficient documentation

## 2023-08-24 DIAGNOSIS — H409 Unspecified glaucoma: Secondary | ICD-10-CM | POA: Insufficient documentation

## 2023-08-24 DIAGNOSIS — M545 Low back pain, unspecified: Secondary | ICD-10-CM | POA: Insufficient documentation

## 2023-08-24 DIAGNOSIS — Z8719 Personal history of other diseases of the digestive system: Secondary | ICD-10-CM | POA: Insufficient documentation

## 2023-08-24 DIAGNOSIS — K219 Gastro-esophageal reflux disease without esophagitis: Secondary | ICD-10-CM | POA: Insufficient documentation

## 2023-08-24 DIAGNOSIS — F411 Generalized anxiety disorder: Secondary | ICD-10-CM | POA: Insufficient documentation

## 2023-08-24 DIAGNOSIS — F439 Reaction to severe stress, unspecified: Secondary | ICD-10-CM | POA: Insufficient documentation

## 2023-08-24 DIAGNOSIS — Z8589 Personal history of malignant neoplasm of other organs and systems: Secondary | ICD-10-CM | POA: Insufficient documentation

## 2023-08-24 DIAGNOSIS — K649 Unspecified hemorrhoids: Secondary | ICD-10-CM | POA: Insufficient documentation

## 2023-08-24 DIAGNOSIS — M47816 Spondylosis without myelopathy or radiculopathy, lumbar region: Secondary | ICD-10-CM | POA: Insufficient documentation

## 2023-08-25 NOTE — Progress Notes (Deleted)
Celso Amy, PA-C [address]  [address]  [place], [place] [address]  Main: [phone]  Fax: [phone]   Primary Care Physician: Catha Gosselin, MD  Primary Gastroenterologist:  Celso Amy, PA-C   CC: F/U chronic constipation with overflow diarrhea  HPI: Caitlyn [name] is a 89 y.o. female  Current Outpatient Medications  Medication Sig Dispense Refill   acetaminophen (TYLENOL) 325 MG tablet Take 650 mg by mouth every 6 (six) hours as needed.     antiseptic oral rinse (BIOTENE) LIQD 15 mLs by Mouth Rinse route as needed for dry mouth.     aspirin 81 MG tablet Take 81 mg by mouth daily.     atorvastatin (LIPITOR) 20 MG tablet Take 1 tablet (20 mg total) by mouth daily. 90 tablet 0   brimonidine (ALPHAGAN P) 0.1 % SOLN 1 drop into R eye only Ophthalmic twice a day     Calcium Carbonate Antacid (TUMS PO) Take 1 tablet by mouth daily.     cycloSPORINE (RESTASIS) 0.05 % ophthalmic emulsion Place 1 drop into both eyes 2 (two) times daily.      denosumab (PROLIA) 60 MG/ML SOSY injection Inject 60 mg into the skin every 6 (six) months.     Dentifrices (BIOTENE DRY MOUTH CARE DT) Mouth/Throat     Docusate Sodium (DSS) 100 MG CAPS 1 capsule as needed Orally Once a day     ferrous sulfate 220 (44 Fe) MG/5ML solution Take 220 mg by mouth daily.     hydrochlorothiazide (MICROZIDE) 12.5 MG capsule TAKE 1 CAPSULE BY MOUTH DAILY 90 capsule 3   Ibuprofen (ADVIL PO) Take 1 capsule by mouth daily as needed.     latanoprost (XALATAN) 0.005 % ophthalmic solution Place 1 drop into the right eye daily.     linaclotide (LINZESS) 72 MCG capsule Take 1 capsule (72 mcg total) by mouth daily before breakfast. 90 capsule 3   Multiple Vitamins-Minerals (MULTIVITAMIN WITH IRON-MINERALS) liquid Take by mouth daily.     OVER THE COUNTER MEDICATION Take 1 Dose by mouth daily. CBD OIL     pantoprazole (PROTONIX) 40 MG tablet Take 40 mg by mouth daily.     timolol (TIMOPTIC) 0.5 % ophthalmic  solution Place 1 drop into both eyes 2 (two) times daily.     traMADol (ULTRAM) 50 MG tablet Take 1 tablet (50 mg total) by mouth every 12 (twelve) hours as needed. 20 tablet 0   No current facility-administered medications for this visit.    Allergies as of 08/26/2023 - Review Complete 07/29/2023  Allergen Reaction Noted   Fosamax [alendronate sodium] Nausea Only 01/05/2018   Amoxicillin-pot clavulanate Other (See Comments) 10/24/2020   Crestor [rosuvastatin] Other (See Comments) 10/24/2020   Doxycycline Other (See Comments) 10/24/2020   Gabapentin Other (See Comments) 10/24/2020   Latex Hives 06/16/2012   Statins  06/04/2012    Past Medical History:  Diagnosis Date   AC (acromioclavicular) joint bone spurs 2008   Back   Anemia    Arthritis    Blood transfusion    Bronchitis    COPD (chronic obstructive pulmonary disease) (HCC)    GERD (gastroesophageal reflux disease)    uses tums   Hepatitis    1970's   Hyperlipidemia    Hypertension    Mitral valve prolapse 10/16/2013   MVP (mitral valve prolapse)    with mild to mod MR and TR 04/2012; followed by Dr. Alanda Amass [facility])   Osteoporosis    Sleep apnea  no treatment at this time, had sleep study apprx 8 years ago    Past Surgical History:  Procedure Laterality Date   ABDOMINAL HYSTERECTOMY  1964   ANTERIOR CERVICAL DECOMP/DISCECTOMY FUSION     HEMORRHOID SURGERY  1969, 1971   HERNIA REPAIR     INGUINAL HERNIA REPAIR  06/16/2012   Procedure: HERNIA REPAIR INGUINAL ADULT;  Surgeon: Lodema Pilot, DO;  Location: [facility] OR;  Service: General;  Laterality: Right;   KNEE ARTHROSCOPY  2007 & 2008   NM MYOCAR PERF WALL MOTION  05/27/2011   normal   SPINE SURGERY     TONGUE BIOPSY  2000   US ECHOCARDIOGRAPHY  06/07/2012   mild concentric LVH,LA mod. dilated,RA is mild to mod. dilated, mild MVP,prolapse of the posterior leaflet,mild mitral annular ca+,mild to mod MR & TR, AOV mildly sclerotic, aortic root sclerosis/ca+,  insignificant pericardial effusion.   UVULECTOMY      Review of Systems:    All systems reviewed and negative except where noted in HPI.   Physical Examination:   There were no vitals taken for this visit.  General: Well-nourished, well-developed in no acute distress.  Lungs: Clear to auscultation bilaterally. Non-labored. Heart: Regular rate and rhythm, no murmurs rubs or gallops.  Abdomen: Bowel sounds are normal; Abdomen is Soft; No hepatosplenomegaly, masses or hernias;  No Abdominal Tenderness; No guarding or rebound tenderness. Neuro: Alert and oriented x 3.  Grossly intact.  Psych: Alert and cooperative, normal mood and affect.   Imaging Studies: No results found.  Assessment and Plan:   Caitlyn [name] is a 89 y.o. y/o female ***    Celso Amy, PA-C  Follow up ***  BP check ***

## 2023-08-26 ENCOUNTER — Ambulatory Visit: Payer: Medicare Other | Admitting: Physician Assistant

## 2023-08-31 ENCOUNTER — Ambulatory Visit: Payer: Medicare Other | Admitting: Physician Assistant

## 2023-09-09 ENCOUNTER — Ambulatory Visit: Payer: Medicare Other | Admitting: Physician Assistant

## 2023-09-28 ENCOUNTER — Telehealth: Payer: Self-pay | Admitting: Physician Assistant

## 2023-09-28 NOTE — Telephone Encounter (Signed)
Patient called complaining of diarrhea for 2 weeks.  She is having to wear pads at during the day and at night.  She is having fecal incontinence episodes due to her diarrhea.  She took Linzess 2 days ago.  Took Kaopectate this morning.  She reports decreased memory.  In the past she had rectal impaction with overflow diarrhea.  I am ordering abdominal x-ray 1 view at [facility] imaging to evaluate stool burden.  Evaluate for rectal impaction. [name], please schedule abdominal x-ray 1 view at [facility] imaging on [facility]; diagnosis constipation, diarrhea, history of rectal impaction.  Evaluate stool burden.  Call patient and notify when order has been done. Celso Amy, PA-C

## 2023-09-29 ENCOUNTER — Telehealth: Payer: Self-pay

## 2023-09-29 ENCOUNTER — Other Ambulatory Visit: Payer: Self-pay

## 2023-09-29 DIAGNOSIS — K5900 Constipation, unspecified: Secondary | ICD-10-CM

## 2023-09-29 NOTE — Telephone Encounter (Signed)
Left message letting patient know that xray order has been placed and she can go anytime.   Patient called complaining of diarrhea for 2 weeks.  She is having to wear pads at during the day and at night.  She is having fecal incontinence episodes due to her diarrhea.  She took Linzess 2 days ago.  Took Kaopectate this morning.  She reports decreased memory.  In the past she had rectal impaction with overflow diarrhea.  I am ordering abdominal x-ray 1 view at [facility] imaging to evaluate stool burden.  Evaluate for rectal impaction.  [name], please schedule abdominal x-ray 1 view at [facility] imaging on [facility]; diagnosis constipation, diarrhea, history of rectal impaction.  Evaluate stool burden.  Call patient and notify when order has been done.  Celso Amy, PA-C

## 2023-10-06 ENCOUNTER — Ambulatory Visit
Admission: RE | Admit: 2023-10-06 | Discharge: 2023-10-06 | Disposition: A | Discharge status: Home / Self Care | Payer: Medicare Other | Source: Ambulatory Visit | Attending: Physician Assistant | Admitting: Physician Assistant

## 2023-10-06 DIAGNOSIS — K5904 Chronic idiopathic constipation: Secondary | ICD-10-CM | POA: Diagnosis not present

## 2023-10-06 DIAGNOSIS — I10 Essential (primary) hypertension: Secondary | ICD-10-CM | POA: Diagnosis not present

## 2023-10-06 DIAGNOSIS — K5641 Fecal impaction: Secondary | ICD-10-CM | POA: Diagnosis not present

## 2023-10-06 DIAGNOSIS — K5900 Constipation, unspecified: Secondary | ICD-10-CM | POA: Diagnosis not present

## 2023-10-14 ENCOUNTER — Telehealth: Payer: Self-pay | Admitting: Physician Assistant

## 2023-10-14 NOTE — Telephone Encounter (Signed)
Patient is calling to check on her recent abdominal x-ray results.  She is having a lot of diarrhea with fecal incontinence.  She had abdominal x-ray done at [facility] imaging) in [place] 10/06/2023 to evaluate for rectal impaction.  She has history of overflow diarrhea from constipation.  Please call radiologist and ask them to read the x-ray.  Evaluate stool burden. Celso Amy, PA-C

## 2023-10-16 ENCOUNTER — Telehealth: Payer: Self-pay | Admitting: Physician Assistant

## 2023-10-16 NOTE — Telephone Encounter (Signed)
I had called [facility] 2 days ago and today, requesting for xray to be read and they stated that they would try to get to it soon since it's still not read.

## 2023-10-16 NOTE — Telephone Encounter (Signed)
Phone Followup.

## 2023-10-19 NOTE — Telephone Encounter (Signed)
Caitlyn [name], the X-Ray results are finally read.

## 2023-10-19 NOTE — Progress Notes (Signed)
Call and notify patient abdominal x-ray shows moderate stool burden in the lower colon, consistent with constipation.  No impaction.  She has overflow diarrhea from constipation.  I recommend call in GoLytely colon purge to see if this will relieve her constipation and overflow diarrhea.  Use OTC fleets enema or Dulcolax suppositories to help remove formed stool from the rectum. Celso Amy, PA-C

## 2023-10-20 ENCOUNTER — Telehealth: Payer: Self-pay

## 2023-10-20 MED ORDER — PEG 3350-KCL-NA BICARB-NACL 420 G PO SOLR: 4000.0000 mL | Freq: Once | ORAL | 0 refills | Status: AC

## 2023-10-20 NOTE — Telephone Encounter (Signed)
Spoke with patient-golytely sent to pharmacy. Push fluids.   Call and notify patient abdominal x-ray shows moderate stool burden in the lower colon, consistent with constipation.  No impaction.  She has overflow diarrhea from constipation.  I recommend call in GoLytely colon purge to see if this will relieve her constipation and overflow diarrhea.  Use OTC fleets enema or Dulcolax suppositories to help remove formed stool from the rectum.  Celso Amy, PA-C

## 2023-10-23 DIAGNOSIS — B372 Candidiasis of skin and nail: Secondary | ICD-10-CM | POA: Diagnosis not present

## 2023-10-23 DIAGNOSIS — Z681 Body mass index (BMI) 19 or less, adult: Secondary | ICD-10-CM | POA: Diagnosis not present

## 2023-10-27 ENCOUNTER — Telehealth: Payer: Self-pay | Admitting: Physician Assistant

## 2023-10-27 ENCOUNTER — Telehealth: Payer: Self-pay

## 2023-10-27 NOTE — Telephone Encounter (Signed)
Phone fu

## 2023-10-27 NOTE — Telephone Encounter (Signed)
Spoke with patient- she stated she is having bowel movements daily-sometimes loose stool but no constipation- I also instructed her to call the office detailed message and we will get back with her as soon as we can. Instructed her to hold onto the enema for future need.   Patient called me yesterday on my personal cell phone.  She left message regarding doing an enema or sending it back to the pharmacy.  Patient stated she was doing better and may not need the enema.  Please call and follow-up with patient.  How is her constipation and diarrhea?  If she is better, then she does not need to do the enema.  Please instruct patient the best phone number to call is my work #[phone].  She should leave a detailed message with phone operator or nurse or voicemail.  That is the quickest way for [place] to respond to her calls.  I did not have my cell phone with me yesterday.  Celso Amy, PA-C

## 2023-10-28 DIAGNOSIS — C44722 Squamous cell carcinoma of skin of right lower limb, including hip: Secondary | ICD-10-CM | POA: Diagnosis not present

## 2023-10-28 DIAGNOSIS — D485 Neoplasm of uncertain behavior of skin: Secondary | ICD-10-CM | POA: Diagnosis not present

## 2023-11-06 ENCOUNTER — Emergency Department (HOSPITAL_COMMUNITY)
Admission: EM | Admit: 2023-11-06 | Discharge: 2023-11-06 | Disposition: A | Discharge status: Home / Self Care | Payer: Medicare Other | Attending: Emergency Medicine | Admitting: Emergency Medicine

## 2023-11-06 ENCOUNTER — Emergency Department (HOSPITAL_COMMUNITY): Payer: Medicare Other

## 2023-11-06 ENCOUNTER — Encounter (HOSPITAL_COMMUNITY): Payer: Self-pay | Admitting: *Deleted

## 2023-11-06 ENCOUNTER — Other Ambulatory Visit: Payer: Self-pay

## 2023-11-06 ENCOUNTER — Telehealth: Payer: Self-pay | Admitting: Orthopaedic Surgery

## 2023-11-06 DIAGNOSIS — S060X0A Concussion without loss of consciousness, initial encounter: Secondary | ICD-10-CM | POA: Diagnosis not present

## 2023-11-06 DIAGNOSIS — Z91040 Latex allergy status: Secondary | ICD-10-CM | POA: Insufficient documentation

## 2023-11-06 DIAGNOSIS — I10 Essential (primary) hypertension: Secondary | ICD-10-CM | POA: Insufficient documentation

## 2023-11-06 DIAGNOSIS — W19XXXA Unspecified fall, initial encounter: Secondary | ICD-10-CM

## 2023-11-06 DIAGNOSIS — R197 Diarrhea, unspecified: Secondary | ICD-10-CM | POA: Insufficient documentation

## 2023-11-06 DIAGNOSIS — J449 Chronic obstructive pulmonary disease, unspecified: Secondary | ICD-10-CM | POA: Insufficient documentation

## 2023-11-06 DIAGNOSIS — S0083XA Contusion of other part of head, initial encounter: Secondary | ICD-10-CM | POA: Insufficient documentation

## 2023-11-06 DIAGNOSIS — R6889 Other general symptoms and signs: Secondary | ICD-10-CM | POA: Diagnosis not present

## 2023-11-06 DIAGNOSIS — M79602 Pain in left arm: Secondary | ICD-10-CM | POA: Insufficient documentation

## 2023-11-06 DIAGNOSIS — M47816 Spondylosis without myelopathy or radiculopathy, lumbar region: Secondary | ICD-10-CM | POA: Diagnosis not present

## 2023-11-06 DIAGNOSIS — S060X1A Concussion with loss of consciousness of 30 minutes or less, initial encounter: Secondary | ICD-10-CM | POA: Diagnosis not present

## 2023-11-06 DIAGNOSIS — S199XXA Unspecified injury of neck, initial encounter: Secondary | ICD-10-CM | POA: Diagnosis not present

## 2023-11-06 DIAGNOSIS — Z043 Encounter for examination and observation following other accident: Secondary | ICD-10-CM | POA: Diagnosis not present

## 2023-11-06 DIAGNOSIS — W010XXA Fall on same level from slipping, tripping and stumbling without subsequent striking against object, initial encounter: Secondary | ICD-10-CM | POA: Insufficient documentation

## 2023-11-06 DIAGNOSIS — M79601 Pain in right arm: Secondary | ICD-10-CM | POA: Insufficient documentation

## 2023-11-06 DIAGNOSIS — S0990XA Unspecified injury of head, initial encounter: Secondary | ICD-10-CM | POA: Diagnosis not present

## 2023-11-06 DIAGNOSIS — M549 Dorsalgia, unspecified: Secondary | ICD-10-CM | POA: Diagnosis not present

## 2023-11-06 DIAGNOSIS — R9089 Other abnormal findings on diagnostic imaging of central nervous system: Secondary | ICD-10-CM | POA: Diagnosis not present

## 2023-11-06 DIAGNOSIS — R4781 Slurred speech: Secondary | ICD-10-CM | POA: Diagnosis not present

## 2023-11-06 DIAGNOSIS — S060X9A Concussion with loss of consciousness of unspecified duration, initial encounter: Secondary | ICD-10-CM

## 2023-11-06 DIAGNOSIS — Z743 Need for continuous supervision: Secondary | ICD-10-CM | POA: Diagnosis not present

## 2023-11-06 DIAGNOSIS — Z981 Arthrodesis status: Secondary | ICD-10-CM | POA: Diagnosis not present

## 2023-11-06 LAB — URINALYSIS, ROUTINE W REFLEX MICROSCOPIC
Bacteria, UA: NONE SEEN
Bilirubin Urine: NEGATIVE
Glucose, UA: NEGATIVE mg/dL
Ketones, ur: NEGATIVE mg/dL
Leukocytes,Ua: NEGATIVE
Nitrite: NEGATIVE
Protein, ur: NEGATIVE mg/dL
Specific Gravity, Urine: 1.012 (ref 1.005–1.030)
pH: 6 (ref 5.0–8.0)

## 2023-11-06 LAB — CBC
HCT: 34.6 % — ABNORMAL LOW (ref 36.0–46.0)
Hemoglobin: 11.2 g/dL — ABNORMAL LOW (ref 12.0–15.0)
MCH: 30.3 pg (ref 26.0–34.0)
MCHC: 32.4 g/dL (ref 30.0–36.0)
MCV: 93.5 fL (ref 80.0–100.0)
Platelets: 190 10*3/uL (ref 150–400)
RBC: 3.7 MIL/uL — ABNORMAL LOW (ref 3.87–5.11)
RDW: 13.6 % (ref 11.5–15.5)
WBC: 6.5 10*3/uL (ref 4.0–10.5)
nRBC: 0 % (ref 0.0–0.2)

## 2023-11-06 LAB — BASIC METABOLIC PANEL
Anion gap: 10 (ref 5–15)
BUN: 22 mg/dL (ref 8–23)
CO2: 30 mmol/L (ref 22–32)
Calcium: 9.8 mg/dL (ref 8.9–10.3)
Chloride: 98 mmol/L (ref 98–111)
Creatinine, Ser: 0.67 mg/dL (ref 0.44–1.00)
GFR, Estimated: >60 mL/min (ref >60)
Glucose, Bld: 117 mg/dL — ABNORMAL HIGH (ref 70–99)
Potassium: 3.2 mmol/L — ABNORMAL LOW (ref 3.5–5.1)
Sodium: 138 mmol/L (ref 135–145)

## 2023-11-06 LAB — MAGNESIUM: Magnesium: 1.7 mg/dL (ref 1.7–2.4)

## 2023-11-06 MED ORDER — VALSARTAN 40 MG PO TABS: 40.0000 mg | ORAL_TABLET | Freq: Every day | ORAL | 2 refills | Status: DC

## 2023-11-06 MED ORDER — LIDOCAINE 5 % EX PTCH
1.0000 | MEDICATED_PATCH | CUTANEOUS | Status: DC
  Administered 2023-11-06: 1 via TRANSDERMAL
  Filled 2023-11-06: qty 1

## 2023-11-06 NOTE — ED Notes (Signed)
Discharge instructions reviewed.   Newly prescribed medications discussed. Pharmacy verified.   Opportunity for questions and concerns provided.   Alert, oriented and escorted to husbands vehicle via wheelchair. Displays no signs of distress on departure.

## 2023-11-06 NOTE — ED Triage Notes (Signed)
Pt arrives via [facility] from home. Per report from EMS, the pt had a fall two days, walking to kitchen and tripped, hit her head (contusion to the left cheek). Spouse says that the pt has had memory issues and speech abnormalities since falling-such as she cannot operate the bed like normal at home. She is a/o x4. C/o bilateral upper arm pain, neck and back pain. En route vitals 152/58, hr 98, NSR with PAC's, 100% RA. CBG 169. IV established in the left [facility]. No blood thinners.

## 2023-11-06 NOTE — ED Provider Notes (Incomplete)
[place] EMERGENCY DEPARTMENT AT [facility] Provider Note   CSN: [id number] Arrival date & time: 11/06/23  2036     History {Add pertinent medical, surgical, social history, OB history to HPI:1} Chief Complaint  Patient presents with   Caitlyn [name] is a 89 y.o. female.  89 year old female with history of mitral valve prolapse and COPD who presents emergency department with confusion after a fall.  2 days ago the patient was walking and tripped over her feet.  She fell onto her left side and hit her head.  Says that since the fall her husband had told her that she has been having memory issues and having difficulty working things like the remote for her bed which is atypical for her.  Did have some bruising to the left side of her face and headache.  Not on blood thinners.  Also complaining of arm pain bilaterally.  No recent illnesses at all.  Says that she has chronic diarrhea with up to 9 loose stools per day.       Home Medications Prior to Admission medications   Medication Sig Start Date End Date Taking? Authorizing Provider  acetaminophen (TYLENOL) 325 MG tablet Take 650 mg by mouth every 6 (six) hours as needed.    [provider]  antiseptic oral rinse (BIOTENE) LIQD 15 mLs by Mouth Rinse route as needed for dry mouth.    [provider]  aspirin 81 MG tablet Take 81 mg by mouth daily.    [provider]  atorvastatin (LIPITOR) 20 MG tablet Take 1 tablet (20 mg total) by mouth daily. 08/18/23   Croitoru, Mihai, MD  brimonidine (ALPHAGAN P) 0.1 % SOLN 1 drop into R eye only Ophthalmic twice a day    [provider]  Calcium Carbonate Antacid (TUMS PO) Take 1 tablet by mouth daily.    [provider]  cycloSPORINE (RESTASIS) 0.05 % ophthalmic emulsion Place 1 drop into both eyes 2 (two) times daily.     [provider]  denosumab (PROLIA) 60 MG/ML SOSY injection Inject 60 mg into the skin every  6 (six) months.    [provider]  Dentifrices (BIOTENE DRY MOUTH CARE DT) Mouth/Throat    [provider]  Docusate Sodium (DSS) 100 MG CAPS 1 capsule as needed Orally Once a day    [provider]  ferrous sulfate 220 (44 Fe) MG/5ML solution Take 220 mg by mouth daily.    [provider]  hydrochlorothiazide (MICROZIDE) 12.5 MG capsule TAKE 1 CAPSULE BY MOUTH DAILY 04/27/23   Croitoru, Mihai, MD  Ibuprofen (ADVIL PO) Take 1 capsule by mouth daily as needed.    [provider]  latanoprost (XALATAN) 0.005 % ophthalmic solution Place 1 drop into the right eye daily. 04/19/14   [provider]  linaclotide Karlene Einstein) 72 MCG capsule Take 1 capsule (72 mcg total) by mouth daily before breakfast. 07/29/23 07/23/24  Celso Amy, PA-C  Multiple Vitamins-Minerals (MULTIVITAMIN WITH IRON-MINERALS) liquid Take by mouth daily.    [provider]  OVER THE COUNTER MEDICATION Take 1 Dose by mouth daily. CBD OIL    [provider]  pantoprazole (PROTONIX) 40 MG tablet Take 40 mg by mouth daily. 06/23/22   [provider]  timolol (TIMOPTIC) 0.5 % ophthalmic solution Place 1 drop into both eyes 2 (two) times daily. 09/15/22   [provider]  traMADol (ULTRAM) 50 MG tablet Take 1 tablet (50 mg  total) by mouth every 12 (twelve) hours as needed. 08/13/23   Eldred Manges, MD      Allergies    Fosamax [alendronate sodium], Amoxicillin-pot clavulanate, Crestor [rosuvastatin], Doxycycline, Gabapentin, Latex, and Statins    Review of Systems   Review of Systems  Physical Exam Updated Vital Signs BP (!) 169/88 (BP Location: Right Arm)   Pulse 92   Temp 98.1 F (36.7 C) (Oral)   Resp 20   SpO2 100%  Physical Exam Vitals and nursing note reviewed.  Constitutional:      General: She is not in acute distress.    Appearance: Normal appearance. She is well-developed. She is not ill-appearing.  HENT:     Head: Normocephalic.      Comments: Bruising to the left side of her face    Right Ear: External ear normal.     Left Ear: External ear normal.     Nose: Nose normal.     Mouth/Throat:     Mouth: Mucous membranes are moist.     Pharynx: Oropharynx is clear.  Eyes:     Extraocular Movements: Extraocular movements intact.     Conjunctiva/sclera: Conjunctivae normal.     Pupils: Pupils are equal, round, and reactive to light.  Neck:     Comments: No C-spine midline tenderness to palpation Cardiovascular:     Rate and Rhythm: Normal rate and regular rhythm.     Pulses: Normal pulses.     Heart sounds: Normal heart sounds. No murmur heard. Pulmonary:     Effort: Pulmonary effort is normal. No respiratory distress.     Breath sounds: Normal breath sounds.  Abdominal:     General: Abdomen is flat. There is no distension.     Palpations: Abdomen is soft. There is no mass.     Tenderness: There is no abdominal tenderness. There is no guarding.  Musculoskeletal:        General: No deformity. Normal range of motion.     Cervical back: Normal range of motion and neck supple. No rigidity or tenderness.     Right lower leg: No edema.     Left lower leg: No edema.     Comments: No tenderness to palpation of midline thoracic or lumbar spine.  No step-offs palpated.  No tenderness to palpation of chest wall.  No bruising noted.  No tenderness to palpation of bilateral clavicles.  No tenderness to palpation, bruising, or deformities noted of bilateral shoulders, elbows, wrists, hips, knees, or ankles.  Skin:    General: Skin is warm and dry.  Neurological:     General: No focal deficit present.     Mental Status: She is alert and oriented to person, place, and time. Mental status is at baseline.     Cranial Nerves: No cranial nerve deficit.     Sensory: No sensory deficit.     Motor: No weakness.  Psychiatric:        Mood and Affect: Mood normal.     ED Results / Procedures / Treatments   Labs (all labs  ordered are listed, but only abnormal results are displayed) Labs Reviewed  BASIC METABOLIC PANEL  CBC  URINALYSIS, ROUTINE W REFLEX MICROSCOPIC    EKG None  Radiology No results found.  Procedures Procedures  {Document cardiac monitor, telemetry assessment procedure when appropriate:1}  Medications Ordered in ED Medications - No data to display  ED Course/ Medical Decision Making/ A&P   {   Click here for ABCD2,  HEART and other calculatorsREFRESH Note before signing :1}                              Medical Decision Making Amount and/or Complexity of Data Reviewed Labs: ordered. Radiology: ordered.   ***  {Document critical care time when appropriate:1} {Document review of labs and clinical decision tools ie heart score, Chads2Vasc2 etc:1}  {Document your independent review of radiology images, and any outside records:1} {Document your discussion with family members, caretakers, and with consultants:1} {Document social determinants of health affecting pt's care:1} {Document your decision making why or why not admission, treatments were needed:1} Final Clinical Impression(s) / ED Diagnoses Final diagnoses:  None    Rx / DC Orders ED Discharge Orders     None

## 2023-11-06 NOTE — Telephone Encounter (Signed)
Received call from patient. She fell 3 days ago and would like Dr Ophelia Charter advise. She is unable to travel and states Dr. Ophelia Charter knows her very well. Please call [phone] or [phone]

## 2023-11-06 NOTE — Telephone Encounter (Signed)
Duplicate message. Patient has called once. Sent to Dr. Ophelia Charter to advise.

## 2023-11-06 NOTE — Discharge Instructions (Addendum)
You were seen for your head injury in the emergency department. It is likely that you had a concussion.  At home, please use tylenol for any headaches that you may have. Use the lidocaine patches for your neck. Take your blood pressure every day and start taking the valsartan since it has been high.   Return gradually to work and school over the next week. Take breaks if you are having headaches or trouble thinking clearly.   Follow-up with your primary doctor or concussion clinic in 1 week regarding your visit.  DO NOT return to playing sports or activities where you could sustain additional head trauma until cleared to do so by a healthcare provider.   Return immediately to the emergency department if you experience any of the following: severe headache, persistent vomiting, or any other concerning symptoms.    Thank you for visiting our Emergency Department. It was a pleasure taking care of you today.

## 2023-11-06 NOTE — Telephone Encounter (Signed)
Pt states she fell and hurt both her knee and got a black eye. She states she is unable to get in her car to come to an appt and would like a call from Dr Ophelia Charter. Pt advised the pt she need to call th ambulance to come get her to be seen and not wait past the week to be seen. Pt stated to complain about cost of ambulance. I advised pt she really need to go to hospital to get checked out. Pt asked for medication for pain but has not been in office 09/16/22 for [facility] and Xu 04/2023. Pt only want Dr Ophelia Charter. Pt asking for Dr Ophelia Charter to call her at [phone]. Also advised pt if she goes to hospital she can be seen by on call dr

## 2023-11-09 DIAGNOSIS — H348312 Tributary (branch) retinal vein occlusion, right eye, stable: Secondary | ICD-10-CM | POA: Diagnosis not present

## 2023-11-09 NOTE — Telephone Encounter (Signed)
noted 

## 2023-11-12 ENCOUNTER — Other Ambulatory Visit: Payer: Self-pay | Admitting: Cardiovascular Disease

## 2023-11-24 ENCOUNTER — Telehealth: Payer: Self-pay | Admitting: Cardiovascular Disease

## 2023-11-24 NOTE — Telephone Encounter (Signed)
 Pt c/o medication issue:  1. Name of Medication:   valsartan  (DIOVAN ) 40 MG tablet   2. How are you currently taking this medication (dosage and times per day)?   As prescribed  3. Are you having a reaction (difficulty breathing--STAT)?   4. What is your medication issue?   Patient stated she fell on Christmas Eve and hit the left side of her body.  Patient stated she was prescribed this medication and wants to know if she needs to stay on this medication.

## 2023-11-24 NOTE — Telephone Encounter (Signed)
 Called patient with no answer. Left message to call back

## 2023-11-24 NOTE — Telephone Encounter (Signed)
 We are shooting for 140/90 or less so it sounds appropriate to stay on the valsartan 40 mg daily.

## 2023-11-24 NOTE — Telephone Encounter (Addendum)
 Called and spoke to patient. Below message relayed per Dr Francyne. Patient does have a monitor at home.  BP readings:  1/9 144/82 HR 90 1/10 92/59 HR 103 1/11 125/86 HR 81 1/12 140/87 HR 87 1/14 139/85 HR 81  Valsartan  is a safe medicine and is a very low dose at 40 mg. Thanks however, it is quite possible that her blood pressure was simply elevated due to pain in the anxiety from the fall.  She may not need to take valsartan  at all.  Does she have a way to check her blood pressure at home currently?

## 2023-11-24 NOTE — Telephone Encounter (Signed)
 Called and spoke to patient. Verified name and DOB. Patient reports she fell 12/24. She stated she was sitting with her legs crossed and forgot to uncross them. She fell hitting her face and left side. She has pain all over along with bruising to both eyes. Patient did go to the ED after she started having memory loss. They started her on Valsartan  40 mg due to uncontrolled high BP. She would like to know if it is safe for her to take medication. Please advise.

## 2023-11-24 NOTE — Telephone Encounter (Signed)
 Valsartan  is a safe medicine and is a very low dose at 40 mg. Thanks however, it is quite possible that her blood pressure was simply elevated due to pain in the anxiety from the fall.  She may not need to take valsartan  at all.  Does she have a way to check her blood pressure at home currently?

## 2023-11-25 MED ORDER — VALSARTAN 40 MG PO TABS: 40.0000 mg | ORAL_TABLET | Freq: Every day | ORAL | 2 refills | Status: DC

## 2023-11-25 NOTE — Telephone Encounter (Signed)
 Called and spoke to patient. Below message relayed per Dr Jaycee Metro. Patient stated she did not have a Valsartan  40 mg. I called the pharmacy and they stated patient picked up prescription 11/07/23. I asked patient to look at her pill bottles and give me all the names of the medication she is taking. She did not have a bottle for Valsartan . I called the pharmacy back to ask them to fill medication and informed patient she would have to medication out of pocket. I asked pharmacy if they could use a discount card and they will use a discount card for patient.I called patient back to make sure that she was calling about the Valsartan  and she did not know which medication I was referring to. Inform patient she is suppose to be taking Valsartan  40 mg once a day. She will go to pharmacy and pick up new prescription.

## 2023-11-26 NOTE — Telephone Encounter (Signed)
So I think there is a little confusion here.  My understanding was that the blood pressure readings that she sent to me were acquired while she was taking valsartan.  Therefore I thought she should continue the valsartan.  If those blood pressure readings were actually recorded without medication, she does not need to start the valsartan.

## 2023-11-30 ENCOUNTER — Other Ambulatory Visit: Payer: Self-pay | Admitting: Cardiovascular Disease

## 2023-12-02 NOTE — Telephone Encounter (Signed)
Pt reports that she has been taking the Valsartan, she never stopped. She is not worried about her BP at all- what she is worried about is her constant diarrhea. Pt reports that she has been incontinent for 4 years- both urine and stool (diarrhea). Said that if Dr C has any suggestions of anything she can take to help with that, then let her know. Informed her that I would send this info to him.

## 2023-12-13 ENCOUNTER — Other Ambulatory Visit: Payer: Self-pay | Admitting: Cardiovascular Disease

## 2023-12-29 DIAGNOSIS — D414 Neoplasm of uncertain behavior of bladder: Secondary | ICD-10-CM | POA: Diagnosis not present

## 2023-12-29 DIAGNOSIS — N200 Calculus of kidney: Secondary | ICD-10-CM | POA: Diagnosis not present

## 2024-01-01 ENCOUNTER — Telehealth: Payer: Self-pay | Admitting: Cardiovascular Disease

## 2024-01-01 NOTE — Telephone Encounter (Signed)
 Called patient and patient reviewed list of medications and they are update to date. Advise patient that Dr. Royann Shivers  would like for her to continue Valsartan 40 mg once a day. Made patient aware that 3 of 4 blood pressure  that she gave to writer within target range. Advise patient to continue to take blood pressure and bring blood pressure log to her next visit and her medications. Patient verbalized an understanding.

## 2024-01-01 NOTE — Telephone Encounter (Signed)
 3 out of those 4 blood pressure readings are within target range.  I would not change medicine based on 1 outlier.  Continue valsartan 40 mg once daily.

## 2024-01-01 NOTE — Telephone Encounter (Signed)
 Called and spoke to patient . Patient reports she has been taking all of medication prescribed by her provider. She reports she  took her husband Metoprolol 20 mg on 2/19 and 2/20 and request provider's advice. Made patient aware that I will forward this to provider for Advice. Called Pharmacy and spoke to Tammy at [facility] and verified that the  patient picked up Valsartan 40 mg on 10/2023 and it was 90 day supply. After calling the patient back and verifying the medication, Valsartan 40 mg, patient reports she has been taking Valsartan 40 mg and will not take her husband's metoprolol. Writer informed patient that she should not be taking medication without a prescription and she has refills for Valsartan at her pharmacy. Patient made aware to call office for any other question and she has an appt. Scheduled with provider 3/28. Patient verbalized an understanding.

## 2024-01-01 NOTE — Telephone Encounter (Signed)
 Pt c/o medication issue:  1. Name of Medication: Metoprolol 25 mg    2. How are you currently taking this medication (dosage and times per day)?   3. Are you having a reaction (difficulty breathing--STAT)?   4. What is your medication issue? Patient states that she would like a call back to discuss about starting this medication. States her husband has just started this and she would like a call back to discuss possibly starting herself.

## 2024-01-01 NOTE — Telephone Encounter (Signed)
 Patient called stating she would like to speak to the nurse she spoke to before.  She wouldn't go into any further details.

## 2024-01-11 DIAGNOSIS — H348312 Tributary (branch) retinal vein occlusion, right eye, stable: Secondary | ICD-10-CM | POA: Diagnosis not present

## 2024-01-11 DIAGNOSIS — H35342 Macular cyst, hole, or pseudohole, left eye: Secondary | ICD-10-CM | POA: Diagnosis not present

## 2024-01-11 DIAGNOSIS — H43812 Vitreous degeneration, left eye: Secondary | ICD-10-CM | POA: Diagnosis not present

## 2024-01-11 DIAGNOSIS — H401121 Primary open-angle glaucoma, left eye, mild stage: Secondary | ICD-10-CM | POA: Diagnosis not present

## 2024-01-19 DIAGNOSIS — C44729 Squamous cell carcinoma of skin of left lower limb, including hip: Secondary | ICD-10-CM | POA: Diagnosis not present

## 2024-01-19 DIAGNOSIS — L981 Factitial dermatitis: Secondary | ICD-10-CM | POA: Diagnosis not present

## 2024-01-21 DIAGNOSIS — N200 Calculus of kidney: Secondary | ICD-10-CM | POA: Diagnosis not present

## 2024-01-21 DIAGNOSIS — N3289 Other specified disorders of bladder: Secondary | ICD-10-CM | POA: Diagnosis not present

## 2024-01-21 DIAGNOSIS — K8020 Calculus of gallbladder without cholecystitis without obstruction: Secondary | ICD-10-CM | POA: Diagnosis not present

## 2024-01-21 DIAGNOSIS — D414 Neoplasm of uncertain behavior of bladder: Secondary | ICD-10-CM | POA: Diagnosis not present

## 2024-02-05 ENCOUNTER — Encounter: Payer: Self-pay | Admitting: Cardiovascular Disease

## 2024-02-05 ENCOUNTER — Ambulatory Visit: Payer: Medicare Other | Attending: Cardiovascular Disease | Admitting: Cardiovascular Disease

## 2024-02-05 VITALS — BP 152/87 | Ht 60.0 in | Wt 107.0 lb

## 2024-02-05 DIAGNOSIS — E7800 Pure hypercholesterolemia, unspecified: Secondary | ICD-10-CM

## 2024-02-05 DIAGNOSIS — I341 Nonrheumatic mitral (valve) prolapse: Secondary | ICD-10-CM

## 2024-02-05 DIAGNOSIS — I10 Essential (primary) hypertension: Secondary | ICD-10-CM

## 2024-02-05 NOTE — Patient Instructions (Signed)
 Medication Instructions:  No changes *If you need a refill on your cardiac medications before your next appointment, please call your pharmacy*  Follow-Up: At [facility], you and your health needs are our priority.  As part of our continuing mission to provide you with exceptional heart care, our providers are all part of one team.  This team includes your primary Cardiologist (physician) and Advanced Practice Providers or APPs (Physician Assistants and Nurse Practitioners) who all work together to provide you with the care you need, when you need it.  Your next appointment:   1 year(s)  Provider:   Thurmon Fair, MD     We recommend signing up for the patient portal called "MyChart".  Sign up information is provided on this After Visit Summary.  MyChart is used to connect with patients for Virtual Visits (Telemedicine).  Patients are able to view lab/test results, encounter notes, upcoming appointments, etc.  Non-urgent messages can be sent to your provider as well.   To learn more about what you can do with MyChart, go to [email or web address].        1st Floor: - Lobby - Registration  - Pharmacy  - Lab - Cafe  2nd Floor: - PV Lab - Diagnostic Testing (echo, CT, nuclear med)  3rd Floor: - Vacant  4th Floor: - TCTS (cardiothoracic surgery) - AFib Clinic - Structural Heart Clinic - Vascular Surgery  - Vascular Ultrasound  5th Floor: - HeartCare Cardiology (general and EP) - Clinical Pharmacy for coumadin, hypertension, lipid, weight-loss medications, and med management appointments    Valet parking services will be available as well.

## 2024-02-05 NOTE — Progress Notes (Signed)
 Patient ID: Caitlyn [name], female   DOB: January 26, 1930, 89 y.o.   MRN: [id number]     Cardiology Office Note    Date:  02/05/2024   ID:  Caitlyn, [name] [birth year], MRN [id number]  PCP:  Catha Gosselin, MD  Cardiologist:   Thurmon Fair, MD   No chief complaint on file.   History of Present Illness:  Caitlyn [name] is a 89 y.o. female with long-standing systemic hypertension and hyperlipidemia and mild mitral valve prolapse returning for follow-up.   As always her biggest complaints revolve around severe back pain from thoracic scoliosis.  Today she does not have a lot of issues with diarrhea or constipation.  She has not had any problems with shortness of breath or chest pain and denies any palpitations.  She has not had syncope, but she did have a mechanical fall in the house around Christmas.  Getting off the couch her knee caught on the side arm and she hit her face on the ground.  She briefly lost consciousness.  She was seen in the emergency room, but was not hospitalized.  The imaging study showed no evidence of intracranial or cervical spine abnormalities.  After that visit she was started on very low-dose valsartan 40 mg daily in addition to her hydrochlorothiazide.  She brought a list of blood pressure records over the last several weeks checked morning and afternoon.  Her blood pressure is almost always in the 110-120s/70s-80.  Very rarely she has a systolic blood pressure of 140.  Her heart rate is typically in the 80-90 range, rarely over 100.  Today her blood pressure is high, but she is very upset because she has been subjected to a financial scam.  She has chronic mild ankle edema related to varicose veins.  She has recurrent problems with skin cancer on her lower extremities and has to undergo surgery every now and then.  She has gained back some weight and is no longer severely underweight.  MI is about 21.  Her most recent lipid profile from last  September shows excellent parameters with HDL 87 and LDL 64.  She has no known history of coronary disease with a normal nuclear stress test in 2012 and no history of previous cardiac catheterization.  In the past she has had side effects with certain statins (simvastatin, Crestor) but she seems to tolerate atorvastatin reasonably well.    Past Medical History:  Diagnosis Date   AC (acromioclavicular) joint bone spurs 2008   Back   Anemia    Arthritis    Blood transfusion    Bronchitis    COPD (chronic obstructive pulmonary disease) (HCC)    GERD (gastroesophageal reflux disease)    uses tums   Hepatitis    1970's   Hyperlipidemia    Hypertension    Mitral valve prolapse 10/16/2013   MVP (mitral valve prolapse)    with mild to mod MR and TR 04/2012; followed by Dr. Alanda Amass [facility])   Osteoporosis    Sleep apnea    no treatment at this time, had sleep study apprx 8 years ago    Past Surgical History:  Procedure Laterality Date   ABDOMINAL HYSTERECTOMY  1964   ANTERIOR CERVICAL DECOMP/DISCECTOMY FUSION     HEMORRHOID SURGERY  1969, 1971   HERNIA REPAIR     INGUINAL HERNIA REPAIR  06/16/2012   Procedure: HERNIA REPAIR INGUINAL ADULT;  Surgeon: Lodema Pilot, DO;  Location: [facility] OR;  Service: General;  Laterality: Right;  KNEE ARTHROSCOPY  2007 & 2008   NM MYOCAR PERF WALL MOTION  05/27/2011   normal   SPINE SURGERY     TONGUE BIOPSY  2000   US ECHOCARDIOGRAPHY  06/07/2012   mild concentric LVH,LA mod. dilated,RA is mild to mod. dilated, mild MVP,prolapse of the posterior leaflet,mild mitral annular ca+,mild to mod MR & TR, AOV mildly sclerotic, aortic root sclerosis/ca+, insignificant pericardial effusion.   UVULECTOMY      Outpatient Medications Prior to Visit  Medication Sig Dispense Refill   acetaminophen (TYLENOL) 325 MG tablet Take 650 mg by mouth every 6 (six) hours as needed.     antiseptic oral rinse (BIOTENE) LIQD 15 mLs by Mouth Rinse route as needed for dry mouth.      aspirin 81 MG tablet Take 81 mg by mouth daily.     atorvastatin (LIPITOR) 20 MG tablet TAKE 1 TABLET BY MOUTH DAILY 15 tablet 0   brimonidine (ALPHAGAN P) 0.1 % SOLN 1 drop into R eye only Ophthalmic twice a day     Calcium Carbonate Antacid (TUMS PO) Take 1 tablet by mouth daily.     Coenzyme Q10 (CO Q 10) 100 MG CAPS Take 100 mg by mouth daily at 6 (six) AM.     cycloSPORINE (RESTASIS) 0.05 % ophthalmic emulsion Place 1 drop into both eyes 2 (two) times daily.      denosumab (PROLIA) 60 MG/ML SOSY injection Inject 60 mg into the skin every 6 (six) months.     Dentifrices (BIOTENE DRY MOUTH CARE DT) Mouth/Throat     hydrochlorothiazide (MICROZIDE) 12.5 MG capsule TAKE 1 CAPSULE BY MOUTH DAILY 90 capsule 3   latanoprost (XALATAN) 0.005 % ophthalmic solution Place 1 drop into the right eye daily.     linaclotide (LINZESS) 72 MCG capsule Take 1 capsule (72 mcg total) by mouth daily before breakfast. 90 capsule 3   Multiple Vitamins-Minerals (MULTIVITAMIN WITH IRON-MINERALS) liquid Take by mouth daily.     timolol (TIMOPTIC) 0.5 % ophthalmic solution Place 1 drop into both eyes 2 (two) times daily.     valsartan (DIOVAN) 40 MG tablet Take 1 tablet (40 mg total) by mouth daily. 30 tablet 2   Docusate Sodium (DSS) 100 MG CAPS 1 capsule as needed Orally Once a day (Patient not taking: Reported on 02/05/2024)     ferrous sulfate 220 (44 Fe) MG/5ML solution Take 220 mg by mouth daily. (Patient not taking: Reported on 02/05/2024)     Ibuprofen (ADVIL PO) Take 1 capsule by mouth daily as needed. (Patient not taking: Reported on 02/05/2024)     OVER THE COUNTER MEDICATION Take 1 Dose by mouth daily. CBD OIL (Patient not taking: Reported on 02/05/2024)     pantoprazole (PROTONIX) 40 MG tablet Take 40 mg by mouth daily. (Patient not taking: Reported on 02/05/2024)     traMADol (ULTRAM) 50 MG tablet Take 1 tablet (50 mg total) by mouth every 12 (twelve) hours as needed. (Patient not taking: Reported on 02/05/2024)  20 tablet 0   No facility-administered medications prior to visit.     Allergies:   Fosamax [alendronate sodium], Amoxicillin-pot clavulanate, Crestor [rosuvastatin], Doxycycline, Gabapentin, Latex, and Statins   Social History   Socioeconomic History   Marital status: Married    Spouse name: Not on file   Number of children: Not on file   Years of education: Not on file   Highest education level: Not on file  Occupational History   Not on file  Tobacco Use   Smoking status: Never   Smokeless tobacco: Never  Substance and Sexual Activity   Alcohol use: Yes    Comment: occassional   Drug use: No   Sexual activity: Not on file  Other Topics Concern   Not on file  Social History Narrative   Not on file   Social Drivers of Health   Financial Resource Strain: Not on file  Food Insecurity: Not on file  Transportation Needs: Not on file  Physical Activity: Not on file  Stress: Not on file  Social Connections: Not on file     Family History:  The patient's   family history includes Alzheimer's disease in her father and mother.   ROS:   Please see the history of present illness.    ROS All other systems are reviewed and are negative.   PHYSICAL EXAM:   VS:  BP (!) 152/87 (BP Location: Left Arm, Patient Position: Sitting, Cuff Size: Small)   Ht 5' (1.524 m)   Wt 107 lb (48.5 kg)   SpO2 98%   BMI 20.90 kg/m       General: Alert, oriented x3, no distress, very lean Head: no evidence of trauma, PERRL, EOMI, no exophtalmos or lid lag, no myxedema, no xanthelasma; normal ears, nose and oropharynx Neck: normal jugular venous pulsations and no hepatojugular reflux; brisk carotid pulses without delay and no carotid bruits Chest: clear to auscultation, no signs of consolidation by percussion or palpation, normal fremitus, symmetrical and full respiratory excursions Cardiovascular: normal position and quality of the apical impulse, regular rhythm, normal first and second  heart sounds, has a midsystolic click, but no murmurs, rubs or gallops Abdomen: no tenderness or distention, no masses by palpation, no abnormal pulsatility or arterial bruits, normal bowel sounds, no hepatosplenomegaly Extremities: no clubbing, cyanosis or edema; 2+ radial, ulnar and brachial pulses bilaterally; 2+ right femoral, posterior tibial and dorsalis pedis pulses; 2+ left femoral, posterior tibial and dorsalis pedis pulses; no subclavian or femoral bruits Neurological: grossly nonfocal Psych: Normal mood and affect     Wt Readings from Last 3 Encounters:  02/05/24 107 lb (48.5 kg)  11/06/23 105 lb (47.6 kg)  07/29/23 103 lb 9.6 oz (47 kg)      Studies/Labs Reviewed:   EKG:  EKG is not ordered today.  Most recent tracing from 11/06/2023 is personally reviewed and shows sinus rhythm, voltage criteria only for LVH no ectopy seen on today's tracing, but past ECGs have shown premature atrial contractions.  Recent Labs: 11/06/2023: BUN 22; Creatinine, Ser 0.67; Hemoglobin 11.2; Magnesium 1.7; Platelets 190; Potassium 3.2; Sodium 138   Lipid Panel    Component Value Date/Time   CHOL 162 12/29/2017 1100   TRIG 60 12/29/2017 1100   HDL 95 12/29/2017 1100   CHOLHDL 1.7 12/29/2017 1100   CHOLHDL 1.8 11/27/2015 1125   VLDL 13 11/27/2015 1125   LDLCALC 55 12/29/2017 1100   09/12/2019 Total cholesterol 163, HDL 84, LDL 67, triglycerides 61 Hemoglobin 12.8, creatinine 0.67, potassium 4.4, TSH 2.51  09/25/2020 Total cholesterol 165, HDL 86, LDL 66, triglycerides 60 Hemoglobin 11.7, creatinine 0.58, potassium 4.7, normal liver function tests, TSH 3.37  10/14/2021 Cholesterol 168, HDL 87, LDL 68, triglycerides 69, TSH 2.810  08/07/2022 Hemoglobin 11.2, creatinine 0.71, potassium 4.3, ALT 18  ASSESSMENT:    1. Essential hypertension   2. Hypercholesterolemia   3. Mitral valve prolapse       PLAN:  In order of problems listed above:  HTN:  Excellent control on very  low-dose valsartan and hydrochlorothiazide.  Blood pressure slightly high today because she is very upset about a financial scam that she has been subjected to.  Continue same medications. HLP: Lipid parameters are in exceptional range.  Continue atorvastatin. MVP: This has never really been symptomatic.  She frequently has PACs, but none today. Underweight: Pleased that she managed to gained back a little weight.  Medication Adjustments/Labs and Tests Ordered: Current medicines are reviewed at length with the patient today.  Concerns regarding medicines are outlined above.  Medication changes, Labs and Tests ordered today are listed in the Patient Instructions below. Patient Instructions  Medication Instructions:  No changes *If you need a refill on your cardiac medications before your next appointment, please call your pharmacy*  Follow-Up: At [facility], you and your health needs are our priority.  As part of our continuing mission to provide you with exceptional heart care, our providers are all part of one team.  This team includes your primary Cardiologist (physician) and Advanced Practice Providers or APPs (Physician Assistants and Nurse Practitioners) who all work together to provide you with the care you need, when you need it.  Your next appointment:   1 year(s)  Provider:   Thurmon Fair, MD     We recommend signing up for the patient portal called "MyChart".  Sign up information is provided on this After Visit Summary.  MyChart is used to connect with patients for Virtual Visits (Telemedicine).  Patients are able to view lab/test results, encounter notes, upcoming appointments, etc.  Non-urgent messages can be sent to your provider as well.   To learn more about what you can do with MyChart, go to [email or web address].        1st Floor: - Lobby - Registration  - Pharmacy  - Lab - Cafe  2nd Floor: - PV Lab - Diagnostic Testing (echo, CT, nuclear  med)  3rd Floor: - Vacant  4th Floor: - TCTS (cardiothoracic surgery) - AFib Clinic - Structural Heart Clinic - Vascular Surgery  - Vascular Ultrasound  5th Floor: - HeartCare Cardiology (general and EP) - Clinical Pharmacy for coumadin, hypertension, lipid, weight-loss medications, and med management appointments    Valet parking services will be available as well.        Signed, Thurmon Fair, MD  02/05/2024 4:23 PM    [facility] [address] [place], [place], [place]  [address] Phone: [phone]; Fax: [phone]

## 2024-02-08 ENCOUNTER — Ambulatory Visit: Admitting: Orthopaedic Surgery

## 2024-02-08 DIAGNOSIS — M1711 Unilateral primary osteoarthritis, right knee: Secondary | ICD-10-CM

## 2024-02-08 DIAGNOSIS — M4126 Other idiopathic scoliosis, lumbar region: Secondary | ICD-10-CM | POA: Diagnosis not present

## 2024-02-08 NOTE — Progress Notes (Signed)
 Office Visit Note   Patient: Caitlyn [name]           Date of Birth: 30-Mar-1930           MRN: [id number] Visit Date: 02/08/2024              Requested by: Catha Gosselin, MD [address] [place],  [place] [address] PCP: Catha Gosselin, MD   Assessment & Plan: Visit Diagnoses:  1. Other idiopathic scoliosis, lumbar region   2. Unilateral primary osteoarthritis, right knee     Plan: We discussed using lotion on her legs applying her TED hose to help with the swelling applied immediately in the morning.  She has had multiple biopsies which take many months to heal due to her venous stasis and lack of compression.  She can follow-up with Dr. Christell Constant since I discussed with her I am retiring.  As we have discussed multiple times she has significant curvature would require multilevel instrumentation to fix her central and foraminal stenosis which should be a considerable undertaking at her age surgery is not recommended.  If she had a specific flare she could consider epidural again.  Follow-Up Instructions: No follow-ups on file.   Orders:  No orders of the defined types were placed in this encounter.  No orders of the defined types were placed in this encounter.     Procedures: No procedures performed   Clinical Data: No additional findings.   Subjective: Chief Complaint  Patient presents with   Spine - Pain    HPI 89 year old female returns she has hypertension hyperlipidemia mitral valve prolapse severe scoliosis with chronic back pain problems.  She ambulates with a cane.  Recent fall with bruising entire left side of her face with negative CT for fractures.  Problems with underweight she has gained some weight back.  Knee x-rays have shown bone-on-bone medial compartment of her knees.  Principal problem is been back pain with severe curvature collapse and loss of height.  Previous cervical fusions which are solid at C4-5 and C6-7.  She has intermittently had  epidural injections for multifactorial spinal stenosis at L3-4 and L4-5.  Review of Systems all systems updated unchanged.  No associated bowel or bladder symptoms.  Patient ambulates with a cane she is still driving community ambulator.   Objective: Vital Signs: There were no vitals taken for this visit.  Physical Exam Constitutional:      Appearance: She is well-developed.  HENT:     Head: Normocephalic.     Right Ear: External ear normal.     Left Ear: External ear normal. There is no impacted cerumen.  Eyes:     Pupils: Pupils are equal, round, and reactive to light.  Neck:     Thyroid: No thyromegaly.     Trachea: No tracheal deviation.  Cardiovascular:     Rate and Rhythm: Normal rate.  Pulmonary:     Effort: Pulmonary effort is normal.  Abdominal:     Palpations: Abdomen is soft.  Musculoskeletal:     Cervical back: No rigidity.  Skin:    General: Skin is warm and dry.  Neurological:     Mental Status: She is alert and oriented to person, place, and time.  Psychiatric:        Behavior: Behavior normal.     Ortho Exam levoscoliosis with left thoracic right lumbar curvature mild pelvic obliquity.  Anterior tib quads gastrocsoleus is active and intact.  Bilateral venous stasis changes she has  a knee sleeve on her right knee.  Skin discoloration with mild pitting edema right and left lower extremity below the knee.  Specialty Comments:  No specialty comments available.  Imaging: No results found.   PMFS History: Patient Active Problem List   Diagnosis Date Noted   Cholelithiasis 08/24/2023   Dementia (HCC) 08/24/2023   Edema 08/24/2023   Excessive daytime sleepiness 08/24/2023   Gastro-esophageal reflux disease without esophagitis 08/24/2023   Generalized anxiety disorder 08/24/2023   Glaucoma 08/24/2023   Hearing loss 08/24/2023   Hemorrhoids 08/24/2023   History of gastrointestinal disease 08/24/2023   History of squamous cell carcinoma 08/24/2023    Idiopathic scoliosis and kyphoscoliosis 08/24/2023   Iron deficiency anemia 08/24/2023   Low back pain 08/24/2023   Lumbar spondylosis 08/24/2023   Protein-calorie malnutrition (HCC) 08/24/2023   Situational stress 08/24/2023   Sleep apnea 08/24/2023   Constipation 08/24/2023   Unspecified hearing loss, bilateral 08/24/2023   Vitamin D deficiency 08/24/2023   Bladder wall thickening 07/28/2023   Unilateral primary osteoarthritis, left knee 08/14/2021   Lumbar foraminal stenosis 11/13/2020   Unilateral primary osteoarthritis, right knee 05/17/2019   Cervical facet syndrome 04/28/2019   History of fusion of cervical spine 03/10/2018   Neck pain 03/02/2018   Age-related osteoporosis without current pathological fracture 01/01/2018   Abnormal CXR 05/25/2017   Hypertensive retinopathy 12/04/2015   Chest pain with moderate risk for cardiac etiology 10/15/2015   Scoliosis deformity of spine 10/15/2015   Hyperlipidemia 10/16/2013   Essential hypertension 10/16/2013   Mitral valve prolapse 10/16/2013   Past Medical History:  Diagnosis Date   AC (acromioclavicular) joint bone spurs 2008   Back   Anemia    Arthritis    Blood transfusion    Bronchitis    COPD (chronic obstructive pulmonary disease) (HCC)    GERD (gastroesophageal reflux disease)    uses tums   Hepatitis    1970's   Hyperlipidemia    Hypertension    Mitral valve prolapse 10/16/2013   MVP (mitral valve prolapse)    with mild to mod MR and TR 04/2012; followed by Dr. Alanda Amass [facility])   Osteoporosis    Sleep apnea    no treatment at this time, had sleep study apprx 8 years ago    Family History  Problem Relation Age of Onset   Alzheimer's disease Mother    Alzheimer's disease Father     Past Surgical History:  Procedure Laterality Date   ABDOMINAL HYSTERECTOMY  1964   ANTERIOR CERVICAL DECOMP/DISCECTOMY FUSION     HEMORRHOID SURGERY  1969, 1971   HERNIA REPAIR     INGUINAL HERNIA REPAIR  06/16/2012    Procedure: HERNIA REPAIR INGUINAL ADULT;  Surgeon: Lodema Pilot, DO;  Location: [facility] OR;  Service: General;  Laterality: Right;   KNEE ARTHROSCOPY  2007 & 2008   NM MYOCAR PERF WALL MOTION  05/27/2011   normal   SPINE SURGERY     TONGUE BIOPSY  2000   US ECHOCARDIOGRAPHY  06/07/2012   mild concentric LVH,LA mod. dilated,RA is mild to mod. dilated, mild MVP,prolapse of the posterior leaflet,mild mitral annular ca+,mild to mod MR & TR, AOV mildly sclerotic, aortic root sclerosis/ca+, insignificant pericardial effusion.   UVULECTOMY     Social History   Occupational History   Not on file  Tobacco Use   Smoking status: Never   Smokeless tobacco: Never  Substance and Sexual Activity   Alcohol use: Yes    Comment: occassional  Drug use: No   Sexual activity: Not on file

## 2024-02-09 DIAGNOSIS — R8289 Other abnormal findings on cytological and histological examination of urine: Secondary | ICD-10-CM | POA: Diagnosis not present

## 2024-02-09 DIAGNOSIS — D414 Neoplasm of uncertain behavior of bladder: Secondary | ICD-10-CM | POA: Diagnosis not present

## 2024-02-10 DIAGNOSIS — K5904 Chronic idiopathic constipation: Secondary | ICD-10-CM | POA: Diagnosis not present

## 2024-02-16 ENCOUNTER — Other Ambulatory Visit: Payer: Self-pay | Admitting: Cardiovascular Disease

## 2024-03-01 ENCOUNTER — Telehealth: Payer: Self-pay | Admitting: Physician Assistant

## 2024-03-01 NOTE — Telephone Encounter (Signed)
 Left message for patient to call back.  She has been scheduled to see Brigitte Canard, PA-C on Wednesday, 03/30/24 at 10:20 am.

## 2024-03-01 NOTE — Telephone Encounter (Signed)
 Ok for appt with Brian Campanile

## 2024-03-01 NOTE — Telephone Encounter (Signed)
 Patient called wanting to schedule OV with Brigitte Canard, PA-C to f/u Chronic constipation and diarrhea.  Patient saw Brian Campanile at [facility] in [place]. Please call pt. And schedule OV with Brigitte Canard at [facility].  Give pt. Address and directions to new office. Brigitte Canard, PA-C

## 2024-03-02 NOTE — Telephone Encounter (Signed)
 Patient is advised of scheduled appointment with Brigitte Canard, PA-C on 03/30/24. She verbalizes understanding.

## 2024-03-18 DIAGNOSIS — C44729 Squamous cell carcinoma of skin of left lower limb, including hip: Secondary | ICD-10-CM | POA: Diagnosis not present

## 2024-03-18 DIAGNOSIS — C44722 Squamous cell carcinoma of skin of right lower limb, including hip: Secondary | ICD-10-CM | POA: Diagnosis not present

## 2024-03-24 DIAGNOSIS — D049 Carcinoma in situ of skin, unspecified: Secondary | ICD-10-CM | POA: Diagnosis not present

## 2024-03-24 DIAGNOSIS — C44729 Squamous cell carcinoma of skin of left lower limb, including hip: Secondary | ICD-10-CM | POA: Diagnosis not present

## 2024-03-30 ENCOUNTER — Ambulatory Visit: Admitting: Physician Assistant

## 2024-03-30 NOTE — Progress Notes (Deleted)
 Brigitte Canard, PA-C [address] [place], [place]  [address] Phone: [phone]   Gastroenterology Consultation  Referring Provider:     Allan Ishihara, MD Primary Care Physician:  Allan Ishihara, MD Primary Gastroenterologist:  Brigitte Canard, PA-C / *** Reason for Consultation:     Follow-up chronic constipation with overflow diarrhea        HPI:   Caitlyn [name] is a 89 y.o. y/o female referred for consultation & management  by Allan Ishihara, MD.    She has long history of chronic constipation with overflow diarrhea.  Has had fecal impactions in the past.  Inconsistent with treatment.  Linzess  helped some in the past.  Her situation is complicated by dementia.  Patient has severe lumbar scoliosis which has contributed to constipation, and has been followed by orthopedic Dr. Murrel Arnt.  She is not a candidate for surgery.      Past Medical History:  Diagnosis Date   AC (acromioclavicular) joint bone spurs 2008   Back   Anemia    Arthritis    Blood transfusion    Bronchitis    COPD (chronic obstructive pulmonary disease) (HCC)    GERD (gastroesophageal reflux disease)    uses tums   Hepatitis    1970's   Hyperlipidemia    Hypertension    Mitral valve prolapse 10/16/2013   MVP (mitral valve prolapse)    with mild to mod MR and TR 04/2012; followed by Dr. Ed Gondola [facility])   Osteoporosis    Sleep apnea    no treatment at this time, had sleep study apprx 8 years ago    Past Surgical History:  Procedure Laterality Date   ABDOMINAL HYSTERECTOMY  1964   ANTERIOR CERVICAL DECOMP/DISCECTOMY FUSION     HEMORRHOID SURGERY  1969, 1971   HERNIA REPAIR     INGUINAL HERNIA REPAIR  06/16/2012   Procedure: HERNIA REPAIR INGUINAL ADULT;  Surgeon: Evander Hills, DO;  Location: [facility] OR;  Service: General;  Laterality: Right;   KNEE ARTHROSCOPY  2007 & 2008   NM MYOCAR PERF WALL MOTION  05/27/2011   normal   SPINE SURGERY     TONGUE BIOPSY  2000   US  ECHOCARDIOGRAPHY   06/07/2012   mild concentric LVH,LA mod. dilated,RA is mild to mod. dilated, mild MVP,prolapse of the posterior leaflet,mild mitral annular ca+,mild to mod MR & TR, AOV mildly sclerotic, aortic root sclerosis/ca+, insignificant pericardial effusion.   UVULECTOMY      Prior to Admission medications   Medication Sig Start Date End Date Taking? Authorizing Provider  acetaminophen  (TYLENOL ) 325 MG tablet Take 650 mg by mouth every 6 (six) hours as needed.    [provider]  antiseptic oral rinse (BIOTENE) LIQD 15 mLs by Mouth Rinse route as needed for dry mouth.    [provider]  aspirin 81 MG tablet Take 81 mg by mouth daily.    [provider]  atorvastatin  (LIPITOR) 20 MG tablet TAKE 1 TABLET BY MOUTH DAILY 02/16/24   Croitoru, Mihai, MD  brimonidine (ALPHAGAN P) 0.1 % SOLN 1 drop into R eye only Ophthalmic twice a day    [provider]  Calcium  Carbonate Antacid (TUMS PO) Take 1 tablet by mouth daily.    [provider]  Coenzyme Q10 (CO Q 10) 100 MG CAPS Take 100 mg by mouth daily at 6 (six) AM.    [provider]  cycloSPORINE (RESTASIS) 0.05 % ophthalmic emulsion Place 1 drop  into both eyes 2 (two) times daily.     [provider]  denosumab  (PROLIA ) 60 MG/ML SOSY injection Inject 60 mg into the skin every 6 (six) months.    [provider]  Dentifrices (BIOTENE DRY MOUTH CARE DT) Mouth/Throat    [provider]  Docusate Sodium (DSS) 100 MG CAPS 1 capsule as needed Orally Once a day Patient not taking: Reported on 02/05/2024    [provider]  ferrous sulfate 220 (44 Fe) MG/5ML solution Take 220 mg by mouth daily. Patient not taking: Reported on 02/05/2024    [provider]  hydrochlorothiazide  (MICROZIDE ) 12.5 MG capsule TAKE 1 CAPSULE BY MOUTH DAILY 04/27/23   Croitoru, Mihai, MD  Ibuprofen (ADVIL PO) Take 1 capsule by mouth daily as needed. Patient not taking: Reported on 02/05/2024     [provider]  latanoprost (XALATAN) 0.005 % ophthalmic solution Place 1 drop into the right eye daily. 04/19/14   [provider]  linaclotide  (LINZESS ) 72 MCG capsule Take 1 capsule (72 mcg total) by mouth daily before breakfast. 07/29/23 07/23/24  Brigitte Canard, PA-C  Multiple Vitamins-Minerals (MULTIVITAMIN WITH IRON-MINERALS) liquid Take by mouth daily.    [provider]  OVER THE COUNTER MEDICATION Take 1 Dose by mouth daily. CBD OIL Patient not taking: Reported on 02/05/2024    [provider]  pantoprazole (PROTONIX) 40 MG tablet Take 40 mg by mouth daily. Patient not taking: Reported on 02/05/2024 06/23/22   [provider]  timolol (TIMOPTIC) 0.5 % ophthalmic solution Place 1 drop into both eyes 2 (two) times daily. 09/15/22   [provider]  traMADol  (ULTRAM ) 50 MG tablet Take 1 tablet (50 mg total) by mouth every 12 (twelve) hours as needed. Patient not taking: Reported on 02/05/2024 08/13/23   Adah Acron, MD  valsartan  (DIOVAN ) 40 MG tablet Take 1 tablet (40 mg total) by mouth daily. 11/25/23 02/23/24  Croitoru, Karyl Paget, MD    Family History  Problem Relation Age of Onset   Alzheimer's disease Mother    Alzheimer's disease Father      Social History   Tobacco Use   Smoking status: Never   Smokeless tobacco: Never  Substance Use Topics   Alcohol use: Yes    Comment: occassional   Drug use: No    Allergies as of 03/30/2024 - Review Complete 02/08/2024  Allergen Reaction Noted   Fosamax  [alendronate  sodium] Nausea Only 01/05/2018   Amoxicillin-pot clavulanate Other (See Comments) 10/24/2020   Crestor [rosuvastatin] Other (See Comments) 10/24/2020   Doxycycline Other (See Comments) 10/24/2020   Gabapentin Other (See Comments) 10/24/2020   Latex Hives 06/16/2012   Statins  06/04/2012    Review of Systems:    All systems reviewed and negative except where noted in HPI.   Physical Exam:  There were no vitals taken for  this visit. No LMP recorded. Patient has had a hysterectomy.  General:   Alert,  Well-developed, well-nourished, pleasant and cooperative in NAD Lungs:  Respirations even and unlabored.  Clear throughout to auscultation.   No wheezes, crackles, or rhonchi. No acute distress. Heart:  Regular rate and rhythm; no murmurs, clicks, rubs, or gallops. Abdomen:  Normal bowel sounds.  No bruits.  Soft, and non-distended without masses, hepatosplenomegaly or hernias noted.  No Tenderness.  No guarding or rebound tenderness.    Neurologic:  Alert and oriented x3;  grossly normal neurologically. Psych:  Alert and cooperative. Normal mood and affect.  Imaging Studies: No results  found.  Labs: CBC    Component Value Date/Time   WBC 6.5 11/06/2023 2053   RBC 3.70 (L) 11/06/2023 2053   HGB 11.2 (L) 11/06/2023 2053   HGB 12.5 01/27/2019 1433   HCT 34.6 (L) 11/06/2023 2053   HCT 36.8 01/27/2019 1433   PLT 190 11/06/2023 2053   PLT 202 01/27/2019 1433   MCV 93.5 11/06/2023 2053   MCV 94 01/27/2019 1433   MCH 30.3 11/06/2023 2053   MCHC 32.4 11/06/2023 2053   RDW 13.6 11/06/2023 2053   RDW 13.0 01/27/2019 1433   LYMPHSABS 1.8 08/07/2008 1133   MONOABS 0.4 08/07/2008 1133   EOSABS 0.1 08/07/2008 1133   BASOSABS 0.0 08/07/2008 1133    CMP     Component Value Date/Time   NA 138 11/06/2023 2053   NA 142 12/29/2017 1100   K 3.2 (L) 11/06/2023 2053   CL 98 11/06/2023 2053   CO2 30 11/06/2023 2053   GLUCOSE 117 (H) 11/06/2023 2053   BUN 22 11/06/2023 2053   BUN 14 12/29/2017 1100   CREATININE 0.67 11/06/2023 2053   CREATININE 0.66 11/27/2015 1125   CALCIUM  9.8 11/06/2023 2053   PROT 6.9 12/29/2017 1100   ALBUMIN 4.7 12/29/2017 1100   AST 23 12/29/2017 1100   ALT 15 12/29/2017 1100   ALKPHOS 88 12/29/2017 1100   BILITOT 1.3 (H) 12/29/2017 1100   GFRNONAA >60 11/06/2023 2053   GFRAA 92 12/29/2017 1100    Assessment and Plan:   Novelle Addair is a 89 y.o. y/o female has been  referred for ***  Follow up ***  Brigitte Canard, PA-C

## 2024-03-31 DIAGNOSIS — L570 Actinic keratosis: Secondary | ICD-10-CM | POA: Diagnosis not present

## 2024-03-31 DIAGNOSIS — L821 Other seborrheic keratosis: Secondary | ICD-10-CM | POA: Diagnosis not present

## 2024-03-31 DIAGNOSIS — D485 Neoplasm of uncertain behavior of skin: Secondary | ICD-10-CM | POA: Diagnosis not present

## 2024-04-08 ENCOUNTER — Telehealth: Payer: Self-pay | Admitting: Cardiovascular Disease

## 2024-04-08 NOTE — Telephone Encounter (Signed)
 Pt c/o medication issue:  1. Name of Medication: Metoprolol 25 mg   2. How are you currently taking this medication (dosage and times per day)? 25 mg daily   3. Are you having a reaction (difficulty breathing--STAT)? No   4. What is your medication issue? Pt spouse called in with pt. He states pt bp and hr were running high  and the Valsartan  was not working for her. He states he has been giving her one of his Metoprolol's once a day in the morning and her bp has gone back down to normal ranging around 110-120/80-87 and hr in the 70's. He asked if Dr. Alvis Ba can write an rx for her and send to pharmacy (they are both [facility] patients). Please advise.

## 2024-04-08 NOTE — Telephone Encounter (Signed)
 Left message for patient to call back

## 2024-04-11 MED ORDER — METOPROLOL SUCCINATE ER 25 MG PO TB24: 25.0000 mg | ORAL_TABLET | Freq: Every day | ORAL | 3 refills | Status: AC

## 2024-04-11 NOTE — Telephone Encounter (Signed)
 Spoke with patient and shared Dr. Leola Raisin recommendation:  Okay to stop valsartan  and start metoprolol succinate 25 mg once daily.      Also instructed patient to check BP/HR daily over the next week while taking Toprol XL and call with an update in a week. Patient verbalized understanding and expressed appreciation for follow-up.

## 2024-04-11 NOTE — Telephone Encounter (Signed)
 Left message to call back

## 2024-04-11 NOTE — Telephone Encounter (Signed)
 Okay to stop valsartan  and start metoprolol succinate 25 mg once daily.

## 2024-04-11 NOTE — Telephone Encounter (Signed)
 Pt returning call

## 2024-04-12 DIAGNOSIS — R350 Frequency of micturition: Secondary | ICD-10-CM | POA: Diagnosis not present

## 2024-04-12 DIAGNOSIS — R3915 Urgency of urination: Secondary | ICD-10-CM | POA: Diagnosis not present

## 2024-04-25 ENCOUNTER — Telehealth: Payer: Self-pay | Admitting: Physician Assistant

## 2024-04-25 ENCOUNTER — Ambulatory Visit: Admitting: Physician Assistant

## 2024-04-25 NOTE — Telephone Encounter (Signed)
 I received a call from patient this morning on my personal cell phone.  She left a voicemail message that she is not able to keep her appointment today.  Her husband was taken to the emergency room for health problem, therefore she needed to cancel appointment today.   Please call patient and let her know we received her message.  Cancel appointment.  Tell patient to call [facility] office number [phone] in the future with any messages or information..  I will not get her messages on my personal cell phone.   Brigitte Canard, PA-C

## 2024-04-25 NOTE — Telephone Encounter (Signed)
 Called and left message for patient with information.  Patient will be same day cancelled for today

## 2024-04-27 ENCOUNTER — Other Ambulatory Visit: Payer: Self-pay

## 2024-04-29 DIAGNOSIS — L244 Irritant contact dermatitis due to drugs in contact with skin: Secondary | ICD-10-CM | POA: Diagnosis not present

## 2024-04-29 DIAGNOSIS — L578 Other skin changes due to chronic exposure to nonionizing radiation: Secondary | ICD-10-CM | POA: Diagnosis not present

## 2024-05-03 ENCOUNTER — Other Ambulatory Visit: Payer: Self-pay

## 2024-05-03 DIAGNOSIS — H401112 Primary open-angle glaucoma, right eye, moderate stage: Secondary | ICD-10-CM | POA: Diagnosis not present

## 2024-05-03 DIAGNOSIS — B0231 Zoster conjunctivitis: Secondary | ICD-10-CM | POA: Diagnosis not present

## 2024-05-03 DIAGNOSIS — H401121 Primary open-angle glaucoma, left eye, mild stage: Secondary | ICD-10-CM | POA: Diagnosis not present

## 2024-05-03 MED ORDER — HYDROCHLOROTHIAZIDE 12.5 MG PO CAPS: 12.5000 mg | ORAL_CAPSULE | Freq: Every day | ORAL | 2 refills | Status: AC

## 2024-05-12 ENCOUNTER — Telehealth: Payer: Self-pay | Admitting: Physician Assistant

## 2024-05-12 NOTE — Telephone Encounter (Signed)
 Patient called and stated that she has a question regarding a medication she is taking and would prefer to advise Ellouise of the medication herself. Patient also stated that she has a question regarding trying to get back in to see her. Patient is requesting a call back from [place]. Please advise.

## 2024-05-12 NOTE — Telephone Encounter (Signed)
 Spoke to patient who states that Caitlyn [name] called her 4 times yesterday but the number she left went to an [facility]. Patient states that she also called Caitlyn [name]'s home number but she hasn't called her back. Patient states that she does not have any questions that staff can answer or help her with. I did advise her that she has a follow up scheduled with Caitlyn [name] on 06/21/24 at 9:20 am. I did have to go over this several times with her as she kept getting the dates confused. I also advised her of [facility] location and patient stats that she does know where we are located. No further questions at this time.

## 2024-05-12 NOTE — Telephone Encounter (Signed)
 Pt requesting a call from Ellouise Console PA.

## 2024-05-30 DIAGNOSIS — H00015 Hordeolum externum left lower eyelid: Secondary | ICD-10-CM | POA: Diagnosis not present

## 2024-06-13 ENCOUNTER — Ambulatory Visit: Admitting: Physician Assistant

## 2024-06-20 ENCOUNTER — Encounter: Admitting: Orthopedic Surgery

## 2024-06-20 NOTE — Progress Notes (Signed)
 I start this back to the sober people and then she followed me to [facility] and I saw her there on     Ellouise Console, PA-C [address] [place], [place]  [address] Phone: [phone]   Gastroenterology Consultation  Referring Provider:     Morgan Drivers, MD Primary Care Physician:  Morgan Drivers, MD Primary Gastroenterologist:  Ellouise Console, PA-C / Elspeth Naval, MD  Reason for Consultation:     Chronic constipation, overflow diarrhea        HPI:   Caitlyn [name] is a 89 y.o. y/o female referred for consultation & management  by Morgan Drivers, MD.    I last saw patient at [facility]  07/2023 for f/u chronic constipation with history of fecal impactions and overflow diarrhea.  She was encouraged to take Linzess  72 once daily every day consistently.  Was also advised to avoid Imodium and Kaopectate.  I referred patient to urologist to follow-up with abnormal CT scan of the bladder.  Patient has a long history of severe scoliosis causing constipation, rectal impactions, and overflow diarrhea.  I previously saw her at [facility] gastroenterology.  She also saw Dr. Elicia and Dr. Kriss at [facility].  She was doing well on Linzess  72 daily in the past.  She has tried multiple constipation treatments in the past with little benefit including Miralax, Fiber, OTC laxatives, Enemas.  She has moderate dementia.  When she starts having overflow diarrhea from constipation, then she starts taking OTC Imodium or Kaopectate which makes the problem worse.    ED She has required ED visits for rectal disimpactions in the past.  Current Symptoms: Patient continues to have worsening Caitlyn impairment with dementia.  She is still driving.  Lives with her husband at home who helps with her care.  She walks with a cane.  Very poor historian.  She brings in all of her medications today in a bag which I reviewed.  She has a bottle of lubiprostone 24 mcg and Linzess  72 mcg.  She states the Linzess  was  too expensive, however it worked the best.  She takes either lubiprostone or Linzess  as needed which helps with her constipation.  In the past week her bowel movements have been more regular.  Currently having small bowel movement once or twice daily.  No current diarrhea.  She has episodes where she may not have a bowel movement for a week or 2.  Also has episodes of diarrhea.   Abdominal pelvic CT done 07/07/2023 showed fecal impaction in the rectum.  No bowel obstruction.  Incidental sigmoid diverticulosis and cholelithiasis (asymptomatic).  Multiple nonobstructing left renal calculi.  Mild left renal hydronephrosis.  Apparent linear and nodular thickening of the right posterior bladder wall.  Further evaluation with cystoscopy was recommended.  She cannot remember if she saw a urologist.  I referred her to a Urologist last year.  Labs 10/2023: Hgb 11.2, HCT 34.6, MCV 93.5, platelet 198, WBC 6.5.  UA positive for blood and negative for bacteria.  Sodium 138, potassium 3.2, glucose 117, BUN 22, creatinine 0.67, GFR of 60.  Normal magnesium  1.7.  PCP is Dr. Drivers Morgan at [place] at [facility].  Patient saw PCP 06/20/2024.  Past Medical History:  Diagnosis Date   AC (acromioclavicular) joint bone spurs 2008   Back   Anemia    Arthritis    Blood transfusion    Bronchitis    COPD (chronic obstructive pulmonary disease) (HCC)  GERD (gastroesophageal reflux disease)    uses tums   Hepatitis    1970's   Hyperlipidemia    Hypertension    Mitral valve prolapse 10/16/2013   MVP (mitral valve prolapse)    with mild to mod MR and TR 04/2012; followed by Dr. Maye [facility])   Osteoporosis    Sleep apnea    no treatment at this time, had sleep study apprx 8 years ago    Past Surgical History:  Procedure Laterality Date   ABDOMINAL HYSTERECTOMY  1964   ANTERIOR CERVICAL DECOMP/DISCECTOMY FUSION     HEMORRHOID SURGERY  1969, 1971   HERNIA REPAIR     INGUINAL HERNIA REPAIR  06/16/2012    Procedure: HERNIA REPAIR INGUINAL ADULT;  Surgeon: Redell Faith, DO;  Location: [facility] OR;  Service: General;  Laterality: Right;   KNEE ARTHROSCOPY  2007 & 2008   NM MYOCAR PERF WALL MOTION  05/27/2011   normal   SPINE SURGERY     TONGUE BIOPSY  2000   US  ECHOCARDIOGRAPHY  06/07/2012   mild concentric LVH,LA mod. dilated,RA is mild to mod. dilated, mild MVP,prolapse of the posterior leaflet,mild mitral annular ca+,mild to mod MR & TR, AOV mildly sclerotic, aortic root sclerosis/ca+, insignificant pericardial effusion.   UVULECTOMY      Prior to Admission medications   Medication Sig Start Date End Date Taking? Authorizing Provider  acetaminophen  (TYLENOL ) 325 MG tablet Take 650 mg by mouth every 6 (six) hours as needed.   Yes [provider]  antiseptic oral rinse (BIOTENE) LIQD 15 mLs by Mouth Rinse route as needed for dry mouth.   Yes [provider]  aspirin 81 MG tablet Take 81 mg by mouth daily.   Yes [provider]  atorvastatin  (LIPITOR) 20 MG tablet TAKE 1 TABLET BY MOUTH DAILY 02/16/24  Yes Croitoru, Mihai, MD  brimonidine (ALPHAGAN P) 0.1 % SOLN 1 drop into R eye only Ophthalmic twice a day   Yes [provider]  Calcium  Carbonate Antacid (TUMS PO) Take 1 tablet by mouth daily.   Yes [provider]  Coenzyme Q10 (CO Q 10) 100 MG CAPS Take 100 mg by mouth daily at 6 (six) AM.   Yes [provider]  cycloSPORINE (RESTASIS) 0.05 % ophthalmic emulsion Place 1 drop into both eyes 2 (two) times daily.    Yes [provider]  denosumab  (PROLIA ) 60 MG/ML SOSY injection Inject 60 mg into the skin every 6 (six) months.   Yes [provider]  Dentifrices (BIOTENE DRY MOUTH CARE DT) Mouth/Throat   Yes [provider]  Docusate Sodium (DSS) 100 MG CAPS 1 capsule as needed Orally Once a day   Yes [provider]  ferrous sulfate 220 (44 Fe) MG/5ML solution Take 220 mg by mouth daily.   Yes [provider]  hydrochlorothiazide  (MICROZIDE ) 12.5 MG capsule Take 1 capsule (12.5 mg total) by mouth daily. 05/03/24  Yes Croitoru, Mihai, MD  Ibuprofen (ADVIL PO) Take 1 capsule by mouth daily as needed.   Yes [provider]  latanoprost (XALATAN) 0.005 % ophthalmic solution Place 1 drop into the right eye daily. 04/19/14  Yes [provider]  linaclotide  (LINZESS ) 72 MCG capsule Take 1 capsule (72 mcg total) by mouth daily before breakfast. 07/29/23 07/23/24 Yes [place], PA-C  lubiprostone (AMITIZA) 24 MCG capsule Take 24 mcg by mouth 2 (two) times daily with a meal.   Yes [provider]  metoprolol  succinate (TOPROL -XL) 25  MG 24 hr tablet Take 1 tablet (25 mg total) by mouth daily. 04/11/24  Yes Croitoru, Mihai, MD  Multiple Vitamins-Minerals (MULTIVITAMIN WITH IRON-MINERALS) liquid Take by mouth daily.   Yes [provider]  OVER THE COUNTER MEDICATION Take 1 Dose by mouth daily. CBD OIL   Yes [provider]  pantoprazole (PROTONIX) 40 MG tablet Take 40 mg by mouth daily. 06/23/22  Yes [provider]  timolol (TIMOPTIC) 0.5 % ophthalmic solution Place 1 drop into both eyes 2 (two) times daily. 09/15/22  Yes [provider]  traMADol  (ULTRAM ) 50 MG tablet Take 1 tablet (50 mg total) by mouth every 12 (twelve) hours as needed. 08/13/23  Yes Barbarann Oneil BROCKS, MD     Family History  Problem Relation Age of Onset   Alzheimer's disease Mother    Alzheimer's disease Father      Social History   Tobacco Use   Smoking status: Never   Smokeless tobacco: Never  Vaping Use   Vaping status: Never Used  Substance Use Topics   Alcohol use: Yes    Comment: occassional   Drug use: No    Allergies as of 06/21/2024 - Review Complete 06/21/2024  Allergen Reaction Noted   Fosamax  [alendronate  sodium] Nausea Only 01/05/2018   Amoxicillin-pot clavulanate Other (See Comments) 10/24/2020   Crestor [rosuvastatin] Other (See Comments) 10/24/2020    Doxycycline Other (See Comments) 10/24/2020   Gabapentin Other (See Comments) 10/24/2020   Latex Hives 06/16/2012   Statins  06/04/2012    Review of Systems:    All systems reviewed and negative except where noted in HPI.   Physical Exam:  BP 112/80   Pulse 88   Ht 5' (1.524 m)   Wt 105 lb 9.6 oz (47.9 kg)   BMI 20.62 kg/m  No LMP recorded. Patient has had a hysterectomy.  General:   Alert,  thin, feeble, elderly female; pleasant and cooperative in NAD; walks with a cane.  Moderate to severe scoliosis and kyphosis. Lungs:  Respirations even and unlabored.  Clear throughout to auscultation.   No wheezes, crackles, or rhonchi. No acute distress. Heart:  Regular rate and rhythm; no murmurs, clicks, rubs, or gallops. Abdomen:  Normal bowel sounds.  No bruits.  Soft, and mildly distended without masses, hepatosplenomegaly or hernias noted.  No Tenderness.  No guarding or rebound tenderness.    Neurologic:  Alert and oriented x3;  grossly normal neurologically.  Moderate Caitlyn impairment with dementia. Psych:  Alert and cooperative. Normal mood and affect.  Imaging Studies: No results found.  Labs: CBC    Component Value Date/Time   WBC 6.5 11/06/2023 2053   RBC 3.70 (L) 11/06/2023 2053   HGB 11.2 (L) 11/06/2023 2053   HGB 12.5 01/27/2019 1433   HCT 34.6 (L) 11/06/2023 2053   HCT 36.8 01/27/2019 1433   PLT 190 11/06/2023 2053   PLT 202 01/27/2019 1433   MCV 93.5 11/06/2023 2053   MCV 94 01/27/2019 1433    CMP     Component Value Date/Time   NA 138 11/06/2023 2053   NA 142 12/29/2017 1100   K 3.2 (L) 11/06/2023 2053   CL 98 11/06/2023 2053   CO2 30 11/06/2023 2053   GLUCOSE 117 (H) 11/06/2023 2053   BUN 22 11/06/2023 2053   BUN 14 12/29/2017 1100   CREATININE 0.67 11/06/2023 2053   CREATININE 0.66 11/27/2015 1125   CALCIUM  9.8 11/06/2023 2053   PROT 6.9 12/29/2017 1100   ALBUMIN 4.7  12/29/2017 1100   AST 23 12/29/2017 1100   ALT 15 12/29/2017 1100   ALKPHOS 88  12/29/2017 1100   BILITOT 1.3 (H) 12/29/2017 1100   GFRNONAA >60 11/06/2023 2053   GFRAA 92 12/29/2017 1100    Assessment and Plan:   Betzaira Mentel is a 89 y.o. y/o female has been referred for:  1.  Chronic constipation with history of overflow diarrhea and fecal impactions caused by moderate to severe scoliosis.  Patient appears to be stable and improved at present.  Currently having formed bowel movement daily.  No recent diarrhea. - I gave samples of Linzess  72 mcg once daily as needed.  This has worked the best for her, however it has been cost prohibitive. - If she runs out of Linzess , then she can take Amitiza 24 mcg 1 capsule once or twice daily as needed. - Continue high-fiber diet and drinking 64 ounces of fluids daily.  2.  Moderate to severe dementia (worsening); patient lives at home with her husband who helps with her care. - I encouraged patient not to drive.  I encouraged her to have her husband drive her and that he attend appointments with her if possible. - Discussed potentially moving to assisted living facility and patient declines. - Encouraged routine follow-ups with her primary care physician for monitoring. - We printed her treatment instructions today for her records.  Follow up in 6 months or sooner if worsening GI symptoms.  Ellouise Console, PA-C

## 2024-06-21 ENCOUNTER — Ambulatory Visit: Admitting: Physician Assistant

## 2024-06-21 ENCOUNTER — Encounter: Payer: Self-pay | Admitting: Physician Assistant

## 2024-06-21 VITALS — BP 112/80 | HR 88 | Ht 60.0 in | Wt 105.6 lb

## 2024-06-21 DIAGNOSIS — K5909 Other constipation: Secondary | ICD-10-CM

## 2024-06-21 DIAGNOSIS — K5904 Chronic idiopathic constipation: Secondary | ICD-10-CM

## 2024-06-21 DIAGNOSIS — Z8719 Personal history of other diseases of the digestive system: Secondary | ICD-10-CM

## 2024-06-21 NOTE — Progress Notes (Signed)
 Agree with assessment and plan as outlined.

## 2024-06-21 NOTE — Patient Instructions (Addendum)
 For Constipation:  - Continue Linzess  72mcg 1 tablet once daily before breakfast (Samples Given).  - If you run out of Linzess , then you can take:  Lubiprostone 24mcg 1 capsule once or twice daily with Food.  It was great to see you today!!!  I will see you back in 6 months for followup office visit.  Sincerely,  Ellouise Console, PA-C   Please follow up sooner if symptoms increase or worsen  Due to recent changes in healthcare laws, you may see the results of your imaging and laboratory studies on MyChart before your provider has had a chance to review them.  We understand that in some cases there may be results that are confusing or concerning to you. Not all laboratory results come back in the same time frame and the provider may be waiting for multiple results in order to interpret others.  Please give us  48 hours in order for your provider to thoroughly review all the results before contacting the office for clarification of your results.   Thank you for trusting me with your gastrointestinal care!   Ellouise Console, PA-C _______________________________________________________  If your blood pressure at your visit was 140/90 or greater, please contact your primary care physician to follow up on this.  _______________________________________________________  If you are age 83 or older, your body mass index should be between 23-30. Your Body mass index is 20.62 kg/m. If this is out of the aforementioned range listed, please consider follow up with your Primary Care Provider.  If you are age 92 or younger, your body mass index should be between 19-25. Your Body mass index is 20.62 kg/m. If this is out of the aformentioned range listed, please consider follow up with your Primary Care Provider.   ________________________________________________________  The [place] [facility] providers would like to encourage you to use MYCHART to communicate with providers for non-urgent requests or questions.   Due to long hold times on the telephone, sending your provider a message by [facility] may be a faster and more efficient way to get a response.  Please allow 48 business hours for a response.  Please remember that this is for non-urgent requests.  _______________________________________________________

## 2024-06-27 ENCOUNTER — Telehealth: Payer: Self-pay | Admitting: Physician Assistant

## 2024-06-27 DIAGNOSIS — K5904 Chronic idiopathic constipation: Secondary | ICD-10-CM

## 2024-06-27 MED ORDER — LINACLOTIDE 72 MCG PO CAPS: 72.0000 ug | ORAL_CAPSULE | Freq: Every day | ORAL | 3 refills | Status: AC

## 2024-06-27 NOTE — Telephone Encounter (Signed)
 Spoke to patient who states that over the last 3 days, she has been having multiple watery bowel movements. 9+ yesterday. Denies any blood in stool. Denies any abdominal pain with the exception of bloating and being hard as a rock. No n/v. States she has never had this before.   Of note, patient known to Ellouise Console, PA-C; has moderate dementia. She has a difficult time telling me what medications she takes or recalling that she has had issues with overflow diarrhea in the past.  She says she does not think she has been taking linzess  or amitiza recently because of the diarrhea and thinks she needs a refill of linzess . Linzess  rx sent to pharmacy.  I discussed overflow diarrhea with the patient and recommended she drink at least 64 oz water daily, eat foods high in fiber and take linzess  72 mcg daily (as previously described). Patient states that she wants something to stop her diarrhea and we again discussed constipation with overflow diarrhea and why we would not want to give her any antidiarrheals in this case.  Ellouise, any additional recommendations?

## 2024-06-27 NOTE — Telephone Encounter (Signed)
 Inbound call from patient requesting to speak with Caitlyn [name]. States she is the only one that will be able to help with what she is experiencing. States she does not want to provide any further information until she speaks with [place]. Also requesting a call back as soon as possible. Please advise, thank you

## 2024-06-27 NOTE — Telephone Encounter (Signed)
 Patient called and stated that she is wanting to get medication sent over to her in order to controll her BM. Patient stated that she is going about at least 9 times a day and is not able to control her BM due to them being so watery. Patient is requesting a call back at [phone]. Please advise.

## 2024-07-04 DIAGNOSIS — H04123 Dry eye syndrome of bilateral lacrimal glands: Secondary | ICD-10-CM | POA: Diagnosis not present

## 2024-07-12 DIAGNOSIS — I8312 Varicose veins of left lower extremity with inflammation: Secondary | ICD-10-CM | POA: Diagnosis not present

## 2024-07-12 DIAGNOSIS — I8311 Varicose veins of right lower extremity with inflammation: Secondary | ICD-10-CM | POA: Diagnosis not present

## 2024-07-13 ENCOUNTER — Other Ambulatory Visit: Payer: Self-pay

## 2024-07-25 ENCOUNTER — Encounter: Admitting: Orthopedic Surgery

## 2024-08-01 DIAGNOSIS — H35342 Macular cyst, hole, or pseudohole, left eye: Secondary | ICD-10-CM | POA: Diagnosis not present

## 2024-08-01 DIAGNOSIS — H401121 Primary open-angle glaucoma, left eye, mild stage: Secondary | ICD-10-CM | POA: Diagnosis not present

## 2024-08-01 DIAGNOSIS — H401112 Primary open-angle glaucoma, right eye, moderate stage: Secondary | ICD-10-CM | POA: Diagnosis not present

## 2024-08-01 DIAGNOSIS — H35372 Puckering of macula, left eye: Secondary | ICD-10-CM | POA: Diagnosis not present

## 2024-08-15 ENCOUNTER — Telehealth: Payer: Self-pay | Admitting: Physician Assistant

## 2024-08-15 NOTE — Telephone Encounter (Signed)
 Inbound call from patient stating she is still having alternating constipation and diarrhea. States she is unable to sleep. Requesting a call to discuss further. Please advise, thank you

## 2024-08-16 DIAGNOSIS — D23122 Other benign neoplasm of skin of left lower eyelid, including canthus: Secondary | ICD-10-CM | POA: Diagnosis not present

## 2024-08-16 DIAGNOSIS — H04123 Dry eye syndrome of bilateral lacrimal glands: Secondary | ICD-10-CM | POA: Diagnosis not present

## 2024-08-16 NOTE — Telephone Encounter (Signed)
 Spoke to patient who states that she was just trying to get an appointment with T.Garrett, PA-C for follow up of her bowels but was told by scheduling that she could not be seen until next year. Patient has been scheduled to see Ellouise in follow up on 1121/25 at 820 am.  Patient says that the medications she is taking are not working and she needs something to help her go to the bathroom. When questioned, she tells me that she is in a hurry because she has another doctors appointment that she is running late for, but says her problems are ongoing and havent changed in 4 years. She says that she started Linzess  72 mcg again 4 days ago (she was previously advised to take this consistently every day on 06/27/24) but it hasn't done a thing. We discussed her being on this medication consistently and increasing water intake to 64 oz daily, high fiber foods.

## 2024-08-16 NOTE — Telephone Encounter (Signed)
 Spoke with pt and stated we were returning her call. Pt did not seem to know where we were calling from, discussed with her it looked like she had seen Ellouise in the past. Pt reports she just spoke with someone and does not need to discuss further but thanks us  for the call.

## 2024-09-07 ENCOUNTER — Other Ambulatory Visit

## 2024-09-07 ENCOUNTER — Encounter: Payer: Self-pay | Admitting: Physician Assistant

## 2024-09-07 ENCOUNTER — Ambulatory Visit: Admitting: Physician Assistant

## 2024-09-07 DIAGNOSIS — M1711 Unilateral primary osteoarthritis, right knee: Secondary | ICD-10-CM | POA: Diagnosis not present

## 2024-09-07 NOTE — Progress Notes (Signed)
 Office Visit Note   Patient: Caitlyn [name]           Date of Birth: [birth year]           MRN: [id number] Visit Date: 09/07/2024              Requested by: Morgan Drivers, MD [address] [place],  [place] [address] PCP: Morgan Drivers, MD      HPI: Patient presents today with a chief complaint of right knee pain.  She denies any injuries.  She says it does not hurt that bad right now but just wants to get it checked out.  Assessment & Plan: Visit Diagnoses:  1. Unilateral primary osteoarthritis, right knee     Plan: Patient has been seen in the past and had aspiration and injection for advanced osteoarthritis of her right knee.  She has no effusion today.  She is not very painful.  I did offer her steroid injection but she declined it.  We talked about trying something simple like some topical Voltaren gel and Tylenol  arthritis as needed.  If she needs an injection into her knee I am happy to take care of that for her follow-up as needed  Follow-Up Instructions: No follow-ups on file.   Ortho Exam  Patient is alert, oriented, no adenopathy, well-dressed, normal affect, normal respiratory effort. Right knee no effusion no erythema compartments are soft and compressible no evidence of cellulitis or infection.  She has grinding with range of motion more tender medially than laterally    Imaging: XR KNEE 3 VIEW RIGHT Result Date: 09/07/2024 Radiographs of the right knee demonstrate advanced degenerative changes particularly of the medial and patellofemoral with almost complete loss of joint spacing periarticular osteophytes and sclerotic changes  No images are attached to the encounter.  Labs: No results found for: HGBA1C, ESRSEDRATE, CRP, LABURIC, REPTSTATUS, GRAMSTAIN, CULT, LABORGA   Lab Results  Component Value Date   ALBUMIN 4.7 12/29/2017   ALBUMIN 4.1 11/27/2015   ALBUMIN 4.6 05/23/2014    Lab Results  Component Value Date   MG 1.7  11/06/2023   No results found for: VD25OH  No results found for: PREALBUMIN    Latest Ref Rng & Units 11/06/2023    8:53 PM 01/27/2019    2:33 PM 06/15/2012    9:56 AM  CBC EXTENDED  WBC 4.0 - 10.5 K/uL 6.5  5.8  6.0   RBC 3.87 - 5.11 MIL/uL 3.70  3.92  4.26   Hemoglobin 12.0 - 15.0 g/dL 88.7  87.4  86.6   HCT 36.0 - 46.0 % 34.6  36.8  39.9   Platelets 150 - 400 K/uL 190  202  213      There is no height or weight on file to calculate BMI.  Orders:  Orders Placed This Encounter  Procedures   XR KNEE 3 VIEW RIGHT   No orders of the defined types were placed in this encounter.    Procedures: No procedures performed  Clinical Data: No additional findings.  ROS:  All other systems negative, except as noted in the HPI. Review of Systems  Objective: Vital Signs: There were no vitals taken for this visit.  Specialty Comments:  No specialty comments available.  PMFS History: Patient Active Problem List   Diagnosis Date Noted   Cholelithiasis 08/24/2023   Dementia (HCC) 08/24/2023   Edema 08/24/2023   Excessive daytime sleepiness 08/24/2023   Gastro-esophageal reflux disease without esophagitis 08/24/2023   Generalized anxiety  disorder 08/24/2023   Glaucoma 08/24/2023   Hearing loss 08/24/2023   Hemorrhoids 08/24/2023   History of gastrointestinal disease 08/24/2023   History of squamous cell carcinoma 08/24/2023   Idiopathic scoliosis and kyphoscoliosis 08/24/2023   Iron deficiency anemia 08/24/2023   Low back pain 08/24/2023   Lumbar spondylosis 08/24/2023   Protein-calorie malnutrition 08/24/2023   Situational stress 08/24/2023   Sleep apnea 08/24/2023   Constipation 08/24/2023   Unspecified hearing loss, bilateral 08/24/2023   Vitamin D deficiency 08/24/2023   Bladder wall thickening 07/28/2023   Unilateral primary osteoarthritis, left knee 08/14/2021   Lumbar foraminal stenosis 11/13/2020   Unilateral primary osteoarthritis, right knee 05/17/2019    Cervical facet syndrome 04/28/2019   History of fusion of cervical spine 03/10/2018   Neck pain 03/02/2018   Age-related osteoporosis without current pathological fracture 01/01/2018   Abnormal CXR 05/25/2017   Hypertensive retinopathy 12/04/2015   Chest pain with moderate risk for cardiac etiology 10/15/2015   Scoliosis deformity of spine 10/15/2015   Hyperlipidemia 10/16/2013   Essential hypertension 10/16/2013   Mitral valve prolapse 10/16/2013   Past Medical History:  Diagnosis Date   AC (acromioclavicular) joint bone spurs 2008   Back   Anemia    Arthritis    Blood transfusion    Bronchitis    COPD (chronic obstructive pulmonary disease) (HCC)    GERD (gastroesophageal reflux disease)    uses tums   Hepatitis    1970's   Hyperlipidemia    Hypertension    Mitral valve prolapse 10/16/2013   MVP (mitral valve prolapse)    with mild to mod MR and TR 04/2012; followed by Dr. Maye [facility])   Osteoporosis    Sleep apnea    no treatment at this time, had sleep study apprx 8 years ago    Family History  Problem Relation Age of Onset   Alzheimer's disease Mother    Alzheimer's disease Father     Past Surgical History:  Procedure Laterality Date   ABDOMINAL HYSTERECTOMY  1964   ANTERIOR CERVICAL DECOMP/DISCECTOMY FUSION     HEMORRHOID SURGERY  1969, 1971   HERNIA REPAIR     INGUINAL HERNIA REPAIR  06/16/2012   Procedure: HERNIA REPAIR INGUINAL ADULT;  Surgeon: Redell Faith, DO;  Location: [facility] OR;  Service: General;  Laterality: Right;   KNEE ARTHROSCOPY  2007 & 2008   NM MYOCAR PERF WALL MOTION  05/27/2011   normal   SPINE SURGERY     TONGUE BIOPSY  2000   US  ECHOCARDIOGRAPHY  06/07/2012   mild concentric LVH,LA mod. dilated,RA is mild to mod. dilated, mild MVP,prolapse of the posterior leaflet,mild mitral annular ca+,mild to mod MR & TR, AOV mildly sclerotic, aortic root sclerosis/ca+, insignificant pericardial effusion.   UVULECTOMY     Social History    Occupational History   Not on file  Tobacco Use   Smoking status: Never   Smokeless tobacco: Never  Vaping Use   Vaping status: Never Used  Substance and Sexual Activity   Alcohol use: Yes    Comment: occassional   Drug use: No   Sexual activity: Not on file

## 2024-09-12 ENCOUNTER — Encounter: Payer: Self-pay | Admitting: Radiology

## 2024-09-14 ENCOUNTER — Telehealth: Payer: Self-pay | Admitting: Physician Assistant

## 2024-09-14 NOTE — Telephone Encounter (Signed)
 Inbound call from patient requesting to speak to Caitlyn [name] about her condition and believes something new has arise. Patient did wanting to give further details to me. Patient is wanting to know based off her condition doers she need to be referred out to a different doctor. Please advise.

## 2024-09-14 NOTE — Telephone Encounter (Signed)
 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 Patient complains of 2 days severe whole head pain though worse at night. State this is related to scoliosis and she wants to know of a specialist that would be good to see. Patient also mentions that she is scared her scoliosis has gotten out of hand because it has messed with my memory over the last few days.   I have advised that if she is having severe headache with memory/cognition changes over the last 2 days, she should be evaluated in the emergency room to rule out stroke, severe hypertension or other acute/emergent issues as scoliosis should not cause such acute changes and is more of a progressive issue.  Patient verbalizes understanding of this and says that her husband is available to drive her to the hospital but she wants to wait on a call from her PCP before going.

## 2024-09-28 ENCOUNTER — Emergency Department (HOSPITAL_COMMUNITY)

## 2024-09-28 ENCOUNTER — Encounter (HOSPITAL_COMMUNITY): Payer: Self-pay

## 2024-09-28 ENCOUNTER — Other Ambulatory Visit: Payer: Self-pay

## 2024-09-28 ENCOUNTER — Emergency Department (HOSPITAL_COMMUNITY)
Admission: EM | Admit: 2024-09-28 | Discharge: 2024-09-28 | Disposition: A | Discharge status: Home / Self Care | Attending: Emergency Medicine | Admitting: Emergency Medicine

## 2024-09-28 DIAGNOSIS — S0181XA Laceration without foreign body of other part of head, initial encounter: Secondary | ICD-10-CM | POA: Diagnosis not present

## 2024-09-28 DIAGNOSIS — Z23 Encounter for immunization: Secondary | ICD-10-CM | POA: Insufficient documentation

## 2024-09-28 DIAGNOSIS — W1839XA Other fall on same level, initial encounter: Secondary | ICD-10-CM | POA: Diagnosis not present

## 2024-09-28 DIAGNOSIS — Y92019 Unspecified place in single-family (private) house as the place of occurrence of the external cause: Secondary | ICD-10-CM | POA: Insufficient documentation

## 2024-09-28 DIAGNOSIS — M542 Cervicalgia: Secondary | ICD-10-CM | POA: Diagnosis present

## 2024-09-28 DIAGNOSIS — W19XXXA Unspecified fall, initial encounter: Secondary | ICD-10-CM

## 2024-09-28 MED ORDER — ACETAMINOPHEN 500 MG PO TABS
1000.0000 mg | ORAL_TABLET | Freq: Once | ORAL | Status: AC
  Administered 2024-09-28: 1000 mg via ORAL
  Filled 2024-09-28: qty 2

## 2024-09-28 MED ORDER — LIDOCAINE-EPINEPHRINE 2 %-1:100000 IJ SOLN
20.0000 mL | Freq: Once | INTRAMUSCULAR | Status: AC
  Administered 2024-09-28: 20 mL via INTRADERMAL
  Filled 2024-09-28: qty 1

## 2024-09-28 MED ORDER — TETANUS-DIPHTH-ACELL PERTUSSIS 5-2-15.5 LF-MCG/0.5 IM SUSP
0.5000 mL | Freq: Once | INTRAMUSCULAR | Status: AC
  Administered 2024-09-28: 0.5 mL via INTRAMUSCULAR
  Filled 2024-09-28: qty 0.5

## 2024-09-28 NOTE — ED Triage Notes (Signed)
 Patient BIB EMS for fall at home.  Patient lost balance, no loss of consciousness.  Head lac 2 in length forehead over right eye. Contusion under left eye. Patient also c/o neck and back pain.  Patient not on thinners.  Lives at home with spouse.

## 2024-09-28 NOTE — ED Provider Notes (Signed)
 [place] EMERGENCY DEPARTMENT AT [facility] Provider Note   CSN: [id number] Arrival date & time: 09/28/24  0123     History Chief Complaint  Patient presents with   Fall   Laceration    HPI Caitlyn [name] is a 89 y.o. female presenting for fall States she lost her balance getting off the toilet and went down face first. Left shoulder pain, headache, face lac, neck pain  Patient's recorded medical, surgical, social, medication list and allergies were reviewed in the Snapshot window as part of the initial history.   Review of Systems   Review of Systems  Constitutional:  Negative for chills and fever.  HENT:  Negative for ear pain and sore throat.   Eyes:  Negative for pain and visual disturbance.  Respiratory:  Negative for cough and shortness of breath.   Cardiovascular:  Negative for chest pain and palpitations.  Gastrointestinal:  Negative for abdominal pain and vomiting.  Genitourinary:  Negative for dysuria and hematuria.  Musculoskeletal:  Negative for arthralgias and back pain.  Skin:  Negative for color change and rash.  Neurological:  Negative for seizures and syncope.  All other systems reviewed and are negative.   Physical Exam Updated Vital Signs BP (!) 158/78   Pulse 64   Temp 98.2 F (36.8 C)   Resp 17   SpO2 100%  Physical Exam Constitutional:      General: She is not in acute distress.    Appearance: She is not ill-appearing or toxic-appearing.  HENT:     Head: Normocephalic and atraumatic.  Eyes:     Extraocular Movements: Extraocular movements intact.     Pupils: Pupils are equal, round, and reactive to light.  Cardiovascular:     Rate and Rhythm: Normal rate.  Pulmonary:     Effort: No respiratory distress.  Abdominal:     General: Abdomen is flat.  Musculoskeletal:        General: Signs of injury (Bruising to the face that looks subacute.  5 cm laceration to the forehead.) present. No swelling or deformity.      Cervical back: Normal range of motion. No rigidity.  Skin:    General: Skin is warm and dry.  Neurological:     General: No focal deficit present.     Mental Status: She is alert and oriented to person, place, and time.  Psychiatric:        Mood and Affect: Mood normal.      ED Course/ Medical Decision Making/ A&P    Procedures .Laceration Repair  Date/Time: 09/28/2024 3:47 AM  Performed by: Jerral Meth, MD Authorized by: Jerral Meth, MD   Consent:    Consent obtained:  Verbal   Consent given by:  Parent   Risks, benefits, and alternatives were discussed: yes   Laceration details:    Length (cm):  5 Treatment:    Amount of cleaning:  Standard Skin repair:    Repair method:  Sutures   Suture size:  4-0   Suture material:  Nylon   Suture technique:  Simple interrupted Approximation:    Approximation:  Close Repair type:    Repair type:  Simple Post-procedure details:    Procedure completion:  Tolerated    Medications Ordered in ED Medications  acetaminophen  (TYLENOL ) tablet 1,000 mg (1,000 mg Oral Given 09/28/24 0158)  lidocaine -EPINEPHrine  (XYLOCAINE  W/EPI) 2 %-1:100000 (with pres) injection 20 mL (20 mLs Intradermal Given 09/28/24 0159)  Tdap (ADACEL) injection 0.5 mL (0.5 mLs  Intramuscular Given 09/28/24 0240)   Medical Decision Making:   Caitlyn [name] is a 89 y.o. female who presented to the ED today with a fall at their living facility detailed above. They are not on a blood thinner. Patient placed on continuous vitals and telemetry monitoring while in ED which was reviewed periodically.   Complete initial physical exam performed, notably the patient  was hemodynamically stable  in no acute distress. No obvious deformities or injuries appreciated on extensive physical exam including active range of motion of all joints.     Reviewed and confirmed nursing documentation for past medical history, family history, social history.    Initial  Assessment/Plan:   This is a patient presenting with a moderate blunt mechanism trauma.  As such, I have considered intracranial injuries including intracranial hemorrhage, intrathoracic injuries including blunt myocardial or blunt lung injury, blunt abdominal injuries including aortic dissection, bladder injury, spleen injury, liver injury and I have considered orthopedic injuries including extremity or spinal injury. This is most consistent with an acute life/limb threatening illness complicated by underlying chronic conditions.  With the patient's presentation of moderate mechanism trauma but an otherwise reassuring exam, patient warrants targeted evaluation for potential traumatic injuries. Will proceed with targeted evaluation for potential injuries. Will proceed with CT Head, Cervical Spine CT, and Chest/Pelvis XR.   Additionally, patient with pain at l shoulder. Will evaluate with  XR. Images reviewed and agree with radiology interpretation.  CT CERVICAL SPINE WO CONTRAST Result Date: 09/28/2024 EXAM: CT HEAD AND CERVICAL SPINE 09/28/2024 02:44:54 AM TECHNIQUE: CT of the head and cervical spine was performed without the administration of intravenous contrast. Multiplanar reformatted images are provided for review. Automated exposure control, iterative reconstruction, and/or weight based adjustment of the mA/kV was utilized to reduce the radiation dose to as low as reasonably achievable. COMPARISON: None available. CLINICAL HISTORY: Head trauma, minor (Age >= 65y) FINDINGS: CT HEAD BRAIN AND VENTRICLES: No acute intracranial hemorrhage. No mass effect or midline shift. No abnormal extra-axial fluid collection. No evidence of acute infarct. No hydrocephalus. ORBITS: No acute abnormality. SINUSES AND MASTOIDS: No acute abnormality. SOFT TISSUES AND SKULL: No acute skull fracture. Right forehead contusion/laceration. CT CERVICAL SPINE BONES AND ALIGNMENT: No acute fracture or traumatic malalignment.  Degenerative grade 1 anterolisthesis of C6 on T1. Solid C4-C5 and C6-C7 ACDF. DEGENERATIVE CHANGES: Multilevel facet and uncovertebral hypertrophy with varying degrees of neural foraminal stenosis. SOFT TISSUES: No prevertebral soft tissue swelling. IMPRESSION: 1. No acute intracranial abnormality. 2. No acute fracture or traumatic malalignment of the cervical spine. 3. Right forehead contusion/laceration. Electronically signed by: Gilmore Molt MD 09/28/2024 02:54 AM EST RP Workstation: [id number]   CT HEAD WO CONTRAST ( ) Result Date: 09/28/2024 EXAM: CT HEAD AND CERVICAL SPINE 09/28/2024 02:44:54 AM TECHNIQUE: CT of the head and cervical spine was performed without the administration of intravenous contrast. Multiplanar reformatted images are provided for review. Automated exposure control, iterative reconstruction, and/or weight based adjustment of the mA/kV was utilized to reduce the radiation dose to as low as reasonably achievable. COMPARISON: None available. CLINICAL HISTORY: Head trauma, minor (Age >= 65y) FINDINGS: CT HEAD BRAIN AND VENTRICLES: No acute intracranial hemorrhage. No mass effect or midline shift. No abnormal extra-axial fluid collection. No evidence of acute infarct. No hydrocephalus. ORBITS: No acute abnormality. SINUSES AND MASTOIDS: No acute abnormality. SOFT TISSUES AND SKULL: No acute skull fracture. Right forehead contusion/laceration. CT CERVICAL SPINE BONES AND ALIGNMENT: No acute fracture or traumatic malalignment. Degenerative grade 1 anterolisthesis of  C6 on T1. Solid C4-C5 and C6-C7 ACDF. DEGENERATIVE CHANGES: Multilevel facet and uncovertebral hypertrophy with varying degrees of neural foraminal stenosis. SOFT TISSUES: No prevertebral soft tissue swelling. IMPRESSION: 1. No acute intracranial abnormality. 2. No acute fracture or traumatic malalignment of the cervical spine. 3. Right forehead contusion/laceration. Electronically signed by: Gilmore Molt MD 09/28/2024  02:54 AM EST RP Workstation: [id number]   DG Pelvis Portable Result Date: 09/28/2024 EXAM: 1 or 2 VIEW(S) XRAY OF THE PELVIS 09/28/2024 02:20:00 AM COMPARISON: None available. CLINICAL HISTORY: left shoulder FINDINGS: BONES AND JOINTS: Mild right hip degenerative arthritis. Degenerative changes of the visualized lower lumbar spine. SOFT TISSUES: The soft tissues are unremarkable. IMPRESSION: 1. Mild right hip degenerative arthritis. 2. Degenerative changes of the visualized lower lumbar spine. Electronically signed by: Dorethia Molt MD 09/28/2024 02:54 AM EST RP Workstation: [id number]   DG Shoulder Left Portable Result Date: 09/28/2024 EXAM: 1 VIEW(S) XRAY OF THE LEFT SHOULDER 09/28/2024 02:19:00 AM COMPARISON: None available. CLINICAL HISTORY: left shoulder FINDINGS: BONES AND JOINTS: Advanced Glenohumeral degenerative joint disease. Superior subluxation of the humeral head in keeping with rotator cuff degeneration. Cervical fixation hardware noted. [facility] joint is unremarkable in appearance. SOFT TISSUES: No abnormal calcifications. Aortic atherosclerosis is noted in the visualized chest. Visualized lung is unremarkable. IMPRESSION: 1. Advanced glenohumeral degenerative joint disease with superior subluxation of the humeral head, consistent with rotator cuff degeneration. Electronically signed by: Dorethia Molt MD 09/28/2024 02:52 AM EST RP Workstation: [id number]   DG Chest Portable 1 View Result Date: 09/28/2024 EXAM: 1 VIEW(S) Xray of the Chest 09/28/2024 02:18:00 AM COMPARISON: Chest radiograph 11/06/23 CLINICAL HISTORY: Shortness of breath. FINDINGS: LUNGS AND PLEURA: No focal pulmonary opacity. No pleural effusion. No pneumothorax. HEART AND MEDIASTINUM: No acute abnormality of the cardiac and mediastinal silhouettes. BONES AND SOFT TISSUES: No acute osseous abnormality. ACDF. IMPRESSION: 1. No acute process. Electronically signed by: Gilmore Molt MD 09/28/2024 02:43 AM EST RP Workstation:  [id number]    Final Reassessment and Plan:   All imaging studies negative for acute process. Laceration repaired as above.  Patient's pain is currently under control and she is resting comfortably stable for outpatient care management.  Disposition:  I have considered need for hospitalization, however, considering all of the above, I believe this patient is stable for discharge at this time.  Patient/family educated about specific return precautions for given chief complaint and symptoms.  Patient/family educated about follow-up with PCP .     Patient/family expressed understanding of return precautions and need for follow-up. Patient spoken to regarding all imaging and laboratory results and appropriate follow up for these results. All education provided in verbal form with additional information in written form. Time was allowed for answering of patient questions. Patient discharged.    Emergency Department Medication Summary:   Medications  acetaminophen  (TYLENOL ) tablet 1,000 mg (1,000 mg Oral Given 09/28/24 0158)  lidocaine -EPINEPHrine  (XYLOCAINE  W/EPI) 2 %-1:100000 (with pres) injection 20 mL (20 mLs Intradermal Given 09/28/24 0159)  Tdap (ADACEL) injection 0.5 mL (0.5 mLs Intramuscular Given 09/28/24 0240)            Clinical Impression:  1. Fall, initial encounter      Data Unavailable   Final Clinical Impression(s) / ED Diagnoses Final diagnoses:  Fall, initial encounter    Rx / DC Orders ED Discharge Orders     None         Jerral Meth, MD 09/28/24 [phone]

## 2024-09-28 NOTE — ED Notes (Signed)
 Patient ambulates with cane. Patient is not up to date on tetanus.

## 2024-09-28 NOTE — Discharge Instructions (Signed)
 Stitches will need to be removed in 5 to 7 days.  This can be done here or ideally at primary care.

## 2024-09-28 NOTE — ED Notes (Signed)
 Patient placed in c-collar on arrival with assist from MD.  Patient paperwork completed.  Fall risk safety measures in place.  Suture cart placed outside room.

## 2024-09-28 NOTE — ED Notes (Signed)
 After initial triage - patient also c/o left shoulder pain.

## 2024-09-29 NOTE — Progress Notes (Deleted)
 Caitlyn Console, PA-C [address] [place], [place]  [address] Phone: [phone]   Primary Care Physician: Morgan Drivers, MD  Primary Gastroenterologist:  Caitlyn Console, PA-C / ***  Chief Complaint: Follow-up chronic constipation       HPI:   Discussed the use of AI scribe software for clinical note transcription with the patient, who gave verbal consent to proceed.  89 year old female presents for 63-month follow-up of chronic constipation with history of fecal impactions and overflow diarrhea. Patient has a long history of severe scoliosis causing constipation, rectal impactions, and overflow diarrhea.  She has been followed by orthopedic Dr. Barbarann.  Extensive GI workup unrevealing.  Tried and failed multiple treatments for constipation.  Linzess  worked the best.  GI symptoms are exacerbated by patient's moderate dementia and with her taking antidiarrhea medication such as Imodium and Kaopectate, in spite of being advised not to take antidiarrheal medications.    Current treatment and symptoms:  Patient continues to have worsening memory impairment with dementia. She is still driving. Lives with her husband at home who helps with her care. She walks with a cane. Very poor historian.   Of note, patient went to [facility] ED yesterday 09/28/2024 after sustaining a fall with head injury.  She lost her balance getting off the toilet and went down face first.  Had bruising to her left face and a 5 cm laceration of the forehead which was repaired.  Head CT and multiple x-rays were negative for fractures or acute injuries.  History of Present Illness   Abdominal pelvic CT done 07/07/2023 showed fecal impaction in the rectum.  No bowel obstruction.  Incidental sigmoid diverticulosis and cholelithiasis (asymptomatic).  Multiple nonobstructing left renal calculi.  Mild left renal hydronephrosis.  Apparent linear and nodular thickening of the right posterior bladder wall.  Further  evaluation with cystoscopy was recommended.  She cannot remember if she saw a urologist.  I referred her to a Urologist last year.     Current Outpatient Medications  Medication Sig Dispense Refill   acetaminophen  (TYLENOL ) 325 MG tablet Take 650 mg by mouth every 6 (six) hours as needed.     antiseptic oral rinse (BIOTENE) LIQD 15 mLs by Mouth Rinse route as needed for dry mouth.     aspirin 81 MG tablet Take 81 mg by mouth daily.     atorvastatin  (LIPITOR) 20 MG tablet TAKE 1 TABLET BY MOUTH DAILY 90 tablet 3   brimonidine (ALPHAGAN P) 0.1 % SOLN 1 drop into R eye only Ophthalmic twice a day     Calcium  Carbonate Antacid (TUMS PO) Take 1 tablet by mouth daily.     Coenzyme Q10 (CO Q 10) 100 MG CAPS Take 100 mg by mouth daily at 6 (six) AM.     cycloSPORINE (RESTASIS) 0.05 % ophthalmic emulsion Place 1 drop into both eyes 2 (two) times daily.      denosumab  (PROLIA ) 60 MG/ML SOSY injection Inject 60 mg into the skin every 6 (six) months.     Dentifrices (BIOTENE DRY MOUTH CARE DT) Mouth/Throat     Docusate Sodium (DSS) 100 MG CAPS 1 capsule as needed Orally Once a day     ferrous sulfate 220 (44 Fe) MG/5ML solution Take 220 mg by mouth daily.     hydrochlorothiazide  (MICROZIDE ) 12.5 MG capsule Take 1 capsule (12.5 mg total) by mouth daily. 90 capsule 2   Ibuprofen (ADVIL PO) Take 1 capsule by mouth daily as needed.  latanoprost (XALATAN) 0.005 % ophthalmic solution Place 1 drop into the right eye daily.     linaclotide  (LINZESS ) 72 MCG capsule Take 1 capsule (72 mcg total) by mouth daily before breakfast. 90 capsule 3   lubiprostone (AMITIZA) 24 MCG capsule Take 24 mcg by mouth 2 (two) times daily with a meal.     metoprolol  succinate (TOPROL -XL) 25 MG 24 hr tablet Take 1 tablet (25 mg total) by mouth daily. 90 tablet 3   Multiple Vitamins-Minerals (MULTIVITAMIN WITH IRON-MINERALS) liquid Take by mouth daily.     OVER THE COUNTER MEDICATION Take 1 Dose by mouth daily. CBD OIL      pantoprazole (PROTONIX) 40 MG tablet Take 40 mg by mouth daily.     timolol (TIMOPTIC) 0.5 % ophthalmic solution Place 1 drop into both eyes 2 (two) times daily.     traMADol  (ULTRAM ) 50 MG tablet Take 1 tablet (50 mg total) by mouth every 12 (twelve) hours as needed. 20 tablet 0   No current facility-administered medications for this visit.    Allergies as of 09/30/2024 - Review Complete 09/28/2024  Allergen Reaction Noted   Fosamax  [alendronate  sodium] Nausea Only 01/05/2018   Amoxicillin-pot clavulanate Other (See Comments) 10/24/2020   Crestor [rosuvastatin] Other (See Comments) 10/24/2020   Doxycycline Other (See Comments) 10/24/2020   Gabapentin Other (See Comments) 10/24/2020   Latex Hives 06/16/2012   Statins  06/04/2012    Past Medical History:  Diagnosis Date   AC (acromioclavicular) joint bone spurs 2008   Back   Anemia    Arthritis    Blood transfusion    Bronchitis    COPD (chronic obstructive pulmonary disease) (HCC)    GERD (gastroesophageal reflux disease)    uses tums   Hepatitis    1970's   Hyperlipidemia    Hypertension    Mitral valve prolapse 10/16/2013   MVP (mitral valve prolapse)    with mild to mod MR and TR 04/2012; followed by Dr. Maye [facility])   Osteoporosis    Sleep apnea    no treatment at this time, had sleep study apprx 8 years ago    Past Surgical History:  Procedure Laterality Date   ABDOMINAL HYSTERECTOMY  1964   ANTERIOR CERVICAL DECOMP/DISCECTOMY FUSION     HEMORRHOID SURGERY  1969, 1971   HERNIA REPAIR     INGUINAL HERNIA REPAIR  06/16/2012   Procedure: HERNIA REPAIR INGUINAL ADULT;  Surgeon: Redell Faith, DO;  Location: [facility] OR;  Service: General;  Laterality: Right;   KNEE ARTHROSCOPY  2007 & 2008   NM MYOCAR PERF WALL MOTION  05/27/2011   normal   SPINE SURGERY     TONGUE BIOPSY  2000   US  ECHOCARDIOGRAPHY  06/07/2012   mild concentric LVH,LA mod. dilated,RA is mild to mod. dilated, mild MVP,prolapse of the posterior  leaflet,mild mitral annular ca+,mild to mod MR & TR, AOV mildly sclerotic, aortic root sclerosis/ca+, insignificant pericardial effusion.   UVULECTOMY      Review of Systems:    All systems reviewed and negative except where noted in HPI.    Physical Exam:  There were no vitals taken for this visit. No LMP recorded. Patient has had a hysterectomy.  General: Well-nourished, well-developed in no acute distress.  Lungs: Clear to auscultation bilaterally. Non-labored. Heart: Regular rate and rhythm, no murmurs rubs or gallops.  Abdomen: Bowel sounds are normal; Abdomen is Soft; No hepatosplenomegaly, masses or hernias;  No Abdominal Tenderness; No guarding or rebound tenderness. Neuro:  Alert and oriented x 3.  Grossly intact.  Psych: Alert and cooperative, normal mood and affect.   Imaging Studies: CT CERVICAL SPINE WO CONTRAST Result Date: 09/28/2024 EXAM: CT HEAD AND CERVICAL SPINE 09/28/2024 02:44:54 AM TECHNIQUE: CT of the head and cervical spine was performed without the administration of intravenous contrast. Multiplanar reformatted images are provided for review. Automated exposure control, iterative reconstruction, and/or weight based adjustment of the mA/kV was utilized to reduce the radiation dose to as low as reasonably achievable. COMPARISON: None available. CLINICAL HISTORY: Head trauma, minor (Age >= 65y) FINDINGS: CT HEAD BRAIN AND VENTRICLES: No acute intracranial hemorrhage. No mass effect or midline shift. No abnormal extra-axial fluid collection. No evidence of acute infarct. No hydrocephalus. ORBITS: No acute abnormality. SINUSES AND MASTOIDS: No acute abnormality. SOFT TISSUES AND SKULL: No acute skull fracture. Right forehead contusion/laceration. CT CERVICAL SPINE BONES AND ALIGNMENT: No acute fracture or traumatic malalignment. Degenerative grade 1 anterolisthesis of C6 on T1. Solid C4-C5 and C6-C7 ACDF. DEGENERATIVE CHANGES: Multilevel facet and uncovertebral hypertrophy  with varying degrees of neural foraminal stenosis. SOFT TISSUES: No prevertebral soft tissue swelling. IMPRESSION: 1. No acute intracranial abnormality. 2. No acute fracture or traumatic malalignment of the cervical spine. 3. Right forehead contusion/laceration. Electronically signed by: Gilmore Molt MD 09/28/2024 02:54 AM EST RP Workstation: [id number]   CT HEAD WO CONTRAST ( ) Result Date: 09/28/2024 EXAM: CT HEAD AND CERVICAL SPINE 09/28/2024 02:44:54 AM TECHNIQUE: CT of the head and cervical spine was performed without the administration of intravenous contrast. Multiplanar reformatted images are provided for review. Automated exposure control, iterative reconstruction, and/or weight based adjustment of the mA/kV was utilized to reduce the radiation dose to as low as reasonably achievable. COMPARISON: None available. CLINICAL HISTORY: Head trauma, minor (Age >= 65y) FINDINGS: CT HEAD BRAIN AND VENTRICLES: No acute intracranial hemorrhage. No mass effect or midline shift. No abnormal extra-axial fluid collection. No evidence of acute infarct. No hydrocephalus. ORBITS: No acute abnormality. SINUSES AND MASTOIDS: No acute abnormality. SOFT TISSUES AND SKULL: No acute skull fracture. Right forehead contusion/laceration. CT CERVICAL SPINE BONES AND ALIGNMENT: No acute fracture or traumatic malalignment. Degenerative grade 1 anterolisthesis of C6 on T1. Solid C4-C5 and C6-C7 ACDF. DEGENERATIVE CHANGES: Multilevel facet and uncovertebral hypertrophy with varying degrees of neural foraminal stenosis. SOFT TISSUES: No prevertebral soft tissue swelling. IMPRESSION: 1. No acute intracranial abnormality. 2. No acute fracture or traumatic malalignment of the cervical spine. 3. Right forehead contusion/laceration. Electronically signed by: Gilmore Molt MD 09/28/2024 02:54 AM EST RP Workstation: [id number]   DG Pelvis Portable Result Date: 09/28/2024 EXAM: 1 or 2 VIEW(S) XRAY OF THE PELVIS 09/28/2024 02:20:00  AM COMPARISON: None available. CLINICAL HISTORY: left shoulder FINDINGS: BONES AND JOINTS: Mild right hip degenerative arthritis. Degenerative changes of the visualized lower lumbar spine. SOFT TISSUES: The soft tissues are unremarkable. IMPRESSION: 1. Mild right hip degenerative arthritis. 2. Degenerative changes of the visualized lower lumbar spine. Electronically signed by: Dorethia Molt MD 09/28/2024 02:54 AM EST RP Workstation: [id number]   DG Shoulder Left Portable Result Date: 09/28/2024 EXAM: 1 VIEW(S) XRAY OF THE LEFT SHOULDER 09/28/2024 02:19:00 AM COMPARISON: None available. CLINICAL HISTORY: left shoulder FINDINGS: BONES AND JOINTS: Advanced Glenohumeral degenerative joint disease. Superior subluxation of the humeral head in keeping with rotator cuff degeneration. Cervical fixation hardware noted. The [facility] joint is unremarkable in appearance. SOFT TISSUES: No abnormal calcifications. Aortic atherosclerosis is noted in the visualized chest. Visualized lung is unremarkable. IMPRESSION: 1. Advanced glenohumeral degenerative joint disease with superior  subluxation of the humeral head, consistent with rotator cuff degeneration. Electronically signed by: Dorethia Molt MD 09/28/2024 02:52 AM EST RP Workstation: [id number]   DG Chest Portable 1 View Result Date: 09/28/2024 EXAM: 1 VIEW(S) Xray of the Chest 09/28/2024 02:18:00 AM COMPARISON: Chest radiograph 11/06/23 CLINICAL HISTORY: Shortness of breath. FINDINGS: LUNGS AND PLEURA: No focal pulmonary opacity. No pleural effusion. No pneumothorax. HEART AND MEDIASTINUM: No acute abnormality of the cardiac and mediastinal silhouettes. BONES AND SOFT TISSUES: No acute osseous abnormality. ACDF. IMPRESSION: 1. No acute process. Electronically signed by: Gilmore Molt MD 09/28/2024 02:43 AM EST RP Workstation: [id number]   XR KNEE 3 VIEW RIGHT Result Date: 09/07/2024 Radiographs of the right knee demonstrate advanced degenerative changes particularly of  the medial and patellofemoral with almost complete loss of joint spacing periarticular osteophytes and sclerotic changes   Labs: CBC    Component Value Date/Time   WBC 6.5 11/06/2023 2053   RBC 3.70 (L) 11/06/2023 2053   HGB 11.2 (L) 11/06/2023 2053   HGB 12.5 01/27/2019 1433   HCT 34.6 (L) 11/06/2023 2053   HCT 36.8 01/27/2019 1433   PLT 190 11/06/2023 2053   PLT 202 01/27/2019 1433   MCV 93.5 11/06/2023 2053   MCV 94 01/27/2019 1433   MCH 30.3 11/06/2023 2053   MCHC 32.4 11/06/2023 2053   RDW 13.6 11/06/2023 2053   RDW 13.0 01/27/2019 1433   LYMPHSABS 1.8 08/07/2008 1133   MONOABS 0.4 08/07/2008 1133   EOSABS 0.1 08/07/2008 1133   BASOSABS 0.0 08/07/2008 1133    CMP     Component Value Date/Time   NA 138 11/06/2023 2053   NA 142 12/29/2017 1100   K 3.2 (L) 11/06/2023 2053   CL 98 11/06/2023 2053   CO2 30 11/06/2023 2053   GLUCOSE 117 (H) 11/06/2023 2053   BUN 22 11/06/2023 2053   BUN 14 12/29/2017 1100   CREATININE 0.67 11/06/2023 2053   CREATININE 0.66 11/27/2015 1125   CALCIUM  9.8 11/06/2023 2053   PROT 6.9 12/29/2017 1100   ALBUMIN 4.7 12/29/2017 1100   AST 23 12/29/2017 1100   ALT 15 12/29/2017 1100   ALKPHOS 88 12/29/2017 1100   BILITOT 1.3 (H) 12/29/2017 1100   GFRNONAA >60 11/06/2023 2053   GFRAA 92 12/29/2017 1100       Assessment and Plan:   Caitlyn [name] is a 89 y.o. y/o female ***  Assessment and Plan Assessment & Plan       Caitlyn Console, PA-C  Follow up ***

## 2024-09-30 ENCOUNTER — Ambulatory Visit: Admitting: Physician Assistant

## 2024-09-30 ENCOUNTER — Telehealth: Payer: Self-pay | Admitting: Physician Assistant

## 2024-09-30 NOTE — Telephone Encounter (Signed)
 Called pt and she only wants to speak with Ellouise, had appt today but cancelled. She states she can hardly walk now and wants to discuss further only with Ellouise.

## 2024-09-30 NOTE — Telephone Encounter (Signed)
 Patient called. She would like some pain medication called in to get  her through the weekend.

## 2024-09-30 NOTE — Telephone Encounter (Signed)
 Inbound call from patient stating that she fell and since then she has not been able to walk and believes something is wrong with her. Patient stated that she has already been to the hospital and would just like to speak to Ellouise Console about what has been going on. Please advise.

## 2024-10-04 ENCOUNTER — Ambulatory Visit: Admitting: Physician Assistant

## 2024-10-20 ENCOUNTER — Telehealth: Payer: Self-pay | Admitting: Physician Assistant

## 2024-10-20 ENCOUNTER — Telehealth: Payer: Self-pay | Admitting: Orthopedic Surgery

## 2024-10-20 NOTE — Telephone Encounter (Signed)
 Pt request something for pain, states she can not walk due to the hurting all down the right side.      Caitlyn [name]  @ [address]

## 2024-10-20 NOTE — Telephone Encounter (Signed)
 Pt called and said she needs to speak to [facility] right away because she fell in the tube and hit her head and face and someone gave her Maurilio name to call to help her.  I did ask her to follow up with her PCP and she stated that she been in the hospital already for two weeks and needs help now.  She insist that I send this message to [facility]. CB#[phone]

## 2024-10-20 NOTE — Telephone Encounter (Signed)
 I called and spoke with patient. She fell into her bathtub and had facial laceration with pain in face and head and shoulders. I explained that we could not call in pain medication as she has not been treated in the office for the shoulder pain. I also explained we do not treat head injuries. I offered an appointment, however, she states that she cannot come here, she is on a walker, and she is not going out. I advised to call her PCP to see if he may be able to advise or do a telehealth visit. She states that he cannot do anything.  I expressed how sorry that I was, but explained we cannot prescribe pain medication for a problem that we have not seen her for. Patient said that I was no good to her and hung up.

## 2024-10-26 NOTE — Telephone Encounter (Signed)
 This looks like a duplicate message and had been addressed by Deane last week.

## 2024-10-27 ENCOUNTER — Ambulatory Visit: Admitting: Physician Assistant

## 2024-11-15 ENCOUNTER — Ambulatory Visit: Admitting: Physician Assistant

## 2024-11-18 ENCOUNTER — Ambulatory Visit: Admitting: Physician Assistant

## 2024-11-18 ENCOUNTER — Other Ambulatory Visit

## 2024-11-18 ENCOUNTER — Encounter: Payer: Self-pay | Admitting: Physician Assistant

## 2024-11-18 DIAGNOSIS — M25561 Pain in right knee: Secondary | ICD-10-CM

## 2024-11-18 DIAGNOSIS — M25562 Pain in left knee: Secondary | ICD-10-CM | POA: Diagnosis not present

## 2024-11-18 NOTE — Progress Notes (Signed)
 "  Office Visit Note   Patient: Caitlyn [name]           Date of Birth: 04-26-1930           MRN: [id number] Visit Date: 11/18/2024              Requested by: Morgan Drivers, MD [address] [place],  [place] [address] PCP: Morgan Drivers, MD   Assessment & Plan: Visit Diagnoses:  1. Acute pain of both knees     Plan: It is a pleasant 89 year old woman who have seen in the past for right knee issues.  She does have a history of arthritis in both of her knees.  About 4 weeks ago she fell in the bathroom onto tile onto her knees.  She was taken and spent 2 days in the hospital.  She said she did not really have any knee pain but recently has developed left knee pain on the inside of her knee.  Exam today findings consistent with arthritis of both of her knees.  Her husband is concerned because she seems to have difficulty getting out of the chair.  She admits she does not have a lot of pain today.  I think she would do well with getting some physical therapy for her knees make her little bit stronger and help with her balance.  May follow-up as needed  Follow-Up Instructions: Return if symptoms worsen or fail to improve.   Orders:  Orders Placed This Encounter  Procedures   XR KNEE 3 VIEW RIGHT   XR KNEE 3 VIEW LEFT   Ambulatory referral to Physical Therapy   No orders of the defined types were placed in this encounter.     Procedures: No procedures performed   Clinical Data: No additional findings.   Subjective: Chief Complaint  Patient presents with   Left Knee - Pain    HPI patient is a pleasant 89 year old woman comes in today with left greater than right knee pain.  She did have a fall about 4 weeks ago in her bathroom.  She was taken by EMS to the hospital and was thoroughly evaluated as she did have loss of consciousness she remained in the hospital for a couple days.  She is doing fairly well however she most recently has had the development of some left  left knee pain on the inside.  She has difficulty getting out of a chair because of her knees.  Review of Systems  All other systems reviewed and are negative.    Objective: Vital Signs: There were no vitals taken for this visit.  Physical Exam Constitutional:      Appearance: Normal appearance.  Pulmonary:     Effort: Pulmonary effort is normal.  Skin:    General: Skin is warm and dry.  Neurological:     General: No focal deficit present.     Mental Status: She is alert and oriented to person, place, and time.     Ortho Exam Bilateral knees she has no erythema no effusion compartments are soft and compressible she is neurovascular intact on the right knee she has no tenderness over the medial lateral joint line she is able to sustain a straight leg raise.  On the left knee she has some pain over the medial posterior medial joint line put.  She has good varus valgus stability.  Is able to sustain extension on her leg.  Compartments are soft she is neurovascular intact Specialty Comments:  No  specialty comments available.  Imaging: XR KNEE 3 VIEW RIGHT Result Date: 11/18/2024 Images of her right knee demonstrate advanced tricompartmental arthritis no evidence of any fractures  XR KNEE 3 VIEW LEFT Result Date: 11/18/2024 Images of her knee demonstrate advanced tricompartment arthritis no acute fractures    PMFS History: Patient Active Problem List   Diagnosis Date Noted   Cholelithiasis 08/24/2023   Dementia (HCC) 08/24/2023   Edema 08/24/2023   Excessive daytime sleepiness 08/24/2023   Gastro-esophageal reflux disease without esophagitis 08/24/2023   Generalized anxiety disorder 08/24/2023   Glaucoma 08/24/2023   Hearing loss 08/24/2023   Hemorrhoids 08/24/2023   History of gastrointestinal disease 08/24/2023   History of squamous cell carcinoma 08/24/2023   Idiopathic scoliosis and kyphoscoliosis 08/24/2023   Iron deficiency anemia 08/24/2023   Low back pain  08/24/2023   Lumbar spondylosis 08/24/2023   Protein-calorie malnutrition 08/24/2023   Situational stress 08/24/2023   Sleep apnea 08/24/2023   Constipation 08/24/2023   Unspecified hearing loss, bilateral 08/24/2023   Vitamin D deficiency 08/24/2023   Bladder wall thickening 07/28/2023   Unilateral primary osteoarthritis, left knee 08/14/2021   Lumbar foraminal stenosis 11/13/2020   Unilateral primary osteoarthritis, right knee 05/17/2019   Cervical facet syndrome 04/28/2019   History of fusion of cervical spine 03/10/2018   Neck pain 03/02/2018   Age-related osteoporosis without current pathological fracture 01/01/2018   Abnormal CXR 05/25/2017   Hypertensive retinopathy 12/04/2015   Chest pain with moderate risk for cardiac etiology 10/15/2015   Scoliosis deformity of spine 10/15/2015   Hyperlipidemia 10/16/2013   Essential hypertension 10/16/2013   Mitral valve prolapse 10/16/2013   Past Medical History:  Diagnosis Date   AC (acromioclavicular) joint bone spurs 2008   Back   Anemia    Arthritis    Blood transfusion    Bronchitis    COPD (chronic obstructive pulmonary disease) (HCC)    GERD (gastroesophageal reflux disease)    uses tums   Hepatitis    1970's   Hyperlipidemia    Hypertension    Mitral valve prolapse 10/16/2013   MVP (mitral valve prolapse)    with mild to mod MR and TR 04/2012; followed by Dr. Maye [facility])   Osteoporosis    Sleep apnea    no treatment at this time, had sleep study apprx 8 years ago    Family History  Problem Relation Age of Onset   Alzheimer's disease Mother    Alzheimer's disease Father     Past Surgical History:  Procedure Laterality Date   ABDOMINAL HYSTERECTOMY  1964   ANTERIOR CERVICAL DECOMP/DISCECTOMY FUSION     HEMORRHOID SURGERY  1969, 1971   HERNIA REPAIR     INGUINAL HERNIA REPAIR  06/16/2012   Procedure: HERNIA REPAIR INGUINAL ADULT;  Surgeon: Redell Faith, DO;  Location: [facility] OR;  Service: General;  Laterality:  Right;   KNEE ARTHROSCOPY  2007 & 2008   NM MYOCAR PERF WALL MOTION  05/27/2011   normal   SPINE SURGERY     TONGUE BIOPSY  2000   US  ECHOCARDIOGRAPHY  06/07/2012   mild concentric LVH,LA mod. dilated,RA is mild to mod. dilated, mild MVP,prolapse of the posterior leaflet,mild mitral annular ca+,mild to mod MR & TR, AOV mildly sclerotic, aortic root sclerosis/ca+, insignificant pericardial effusion.   UVULECTOMY     Social History   Occupational History   Not on file  Tobacco Use   Smoking status: Never   Smokeless tobacco: Never  Vaping Use  Vaping status: Never Used  Substance and Sexual Activity   Alcohol use: Yes    Comment: occassional   Drug use: No   Sexual activity: Not on file        "

## 2024-12-06 ENCOUNTER — Encounter: Admitting: Physician Assistant

## 2024-12-12 ENCOUNTER — Ambulatory Visit: Admitting: Physical Therapy

## 2024-12-15 NOTE — Therapy (Incomplete)
 " OUTPATIENT PHYSICAL THERAPY EVALUATION   Patient Name: Caitlyn [name] MRN: [id number] DOB:Mar 10, 1930, 89 y.o., female Today's Date: 12/15/2024  END OF SESSION:   Past Medical History:  Diagnosis Date   AC (acromioclavicular) joint bone spurs 2008   Back   Anemia    Arthritis    Blood transfusion    Bronchitis    COPD (chronic obstructive pulmonary disease) (HCC)    GERD (gastroesophageal reflux disease)    uses tums   Hepatitis    1970's   Hyperlipidemia    Hypertension    Mitral valve prolapse 10/16/2013   MVP (mitral valve prolapse)    with mild to mod MR and TR 04/2012; followed by Dr. [facility])   Osteoporosis    Sleep apnea    no treatment at this time, had sleep study apprx 8 years ago   Past Surgical History:  Procedure Laterality Date   ABDOMINAL HYSTERECTOMY  1964   ANTERIOR CERVICAL DECOMP/DISCECTOMY FUSION     HEMORRHOID SURGERY  1969, 1971   HERNIA REPAIR     INGUINAL HERNIA REPAIR  06/16/2012   Procedure: HERNIA REPAIR INGUINAL ADULT;  Surgeon: Redell Faith, DO;  Location: [facility] OR;  Service: General;  Laterality: Right;   KNEE ARTHROSCOPY  2007 & 2008   NM MYOCAR PERF WALL MOTION  05/27/2011   normal   SPINE SURGERY     TONGUE BIOPSY  2000   US  ECHOCARDIOGRAPHY  06/07/2012   mild concentric LVH,LA mod. dilated,RA is mild to mod. dilated, mild MVP,prolapse of the posterior leaflet,mild mitral annular ca+,mild to mod MR & TR, AOV mildly sclerotic, aortic root sclerosis/ca+, insignificant pericardial effusion.   UVULECTOMY     Patient Active Problem List   Diagnosis Date Noted   Cholelithiasis 08/24/2023   Dementia (HCC) 08/24/2023   Edema 08/24/2023   Excessive daytime sleepiness 08/24/2023   Gastro-esophageal reflux disease without esophagitis 08/24/2023   Generalized anxiety disorder 08/24/2023   Glaucoma 08/24/2023   Hearing loss 08/24/2023   Hemorrhoids 08/24/2023   History of gastrointestinal disease 08/24/2023   History of squamous  cell carcinoma 08/24/2023   Idiopathic scoliosis and kyphoscoliosis 08/24/2023   Iron deficiency anemia 08/24/2023   Low back pain 08/24/2023   Lumbar spondylosis 08/24/2023   Protein-calorie malnutrition 08/24/2023   Situational stress 08/24/2023   Sleep apnea 08/24/2023   Constipation 08/24/2023   Unspecified hearing loss, bilateral 08/24/2023   Vitamin D deficiency 08/24/2023   Bladder wall thickening 07/28/2023   Unilateral primary osteoarthritis, left knee 08/14/2021   Lumbar foraminal stenosis 11/13/2020   Unilateral primary osteoarthritis, right knee 05/17/2019   Cervical facet syndrome 04/28/2019   History of fusion of cervical spine 03/10/2018   Neck pain 03/02/2018   Age-related osteoporosis without current pathological fracture 01/01/2018   Abnormal CXR 05/25/2017   Hypertensive retinopathy 12/04/2015   Chest pain with moderate risk for cardiac etiology 10/15/2015   Scoliosis deformity of spine 10/15/2015   Hyperlipidemia 10/16/2013   Essential hypertension 10/16/2013   Mitral valve prolapse 10/16/2013    PCP: Morgan Drivers MD  REFERRING PROVIDER: Persons, Ronal Dragon, [place]  REFERRING DIAG: [phone] (ICD-10-CM) - Acute pain of both knees  THERAPY DIAG:  No diagnosis found.  Rationale for Evaluation and Treatment: Rehabilitation  ONSET DATE: ***  SUBJECTIVE:   SUBJECTIVE STATEMENT: ***  PERTINENT HISTORY: Medical history: arthritis, anemia, COPD, GERD, hyperlipidemia, Hep, HTN, osteoporosis   PAIN:  NPRS scale: ***/10 Pain location: *** Pain description: *** Aggravating factors: *** Relieving factors: ***  PRECAUTIONS: None  WEIGHT BEARING RESTRICTIONS: No  FALLS:  Has patient fallen in last 6 months? {fallsyesno:27318}  LIVING ENVIRONMENT: Lives with: {[facility] lives with:25569::lives with their family} Lives in: {Lives in:25570} Stairs: {opstairs:27293} Has following equipment at home: {Assistive devices:23999}  OCCUPATION:  ***  PLOF: Independent  PATIENT GOALS: ***  Next MD visit:   OBJECTIVE:   PATIENT SURVEYS:  Patient-Specific Activity Scoring Scheme  0 represents unable to perform. 10 represents able to perform at prior level. 0 1 2 3 4 5 6 7 8 9  10 (Date and Score)   Activity Eval     1. ***      2. ***      3. ***    4.    5.    Score ***    Total score = sum of the activity scores/number of activities Minimum detectable change (90%CI) for average score = 2 points Minimum detectable change (90%CI) for single activity score = 3 points  COGNITION: Overall cognitive status: WFL    SENSATION: {sensation:27233}  EDEMA:  {edema:24020}  MUSCLE LENGTH: Hamstrings: Right *** deg; Left *** deg Thomas test: Right *** deg; Left *** deg  POSTURE:  {posture:25561}  PALPATION: ***  LOWER EXTREMITY ROM:   ROM Right eval Left eval  Hip flexion    Hip extension    Hip abduction    Hip adduction    Hip internal rotation    Hip external rotation    Knee flexion    Knee extension    Ankle dorsiflexion    Ankle plantarflexion    Ankle inversion    Ankle eversion     (Blank rows = not tested)  LOWER EXTREMITY MMT:  MMT Right eval Left eval  Hip flexion    Hip extension    Hip abduction    Hip adduction    Hip internal rotation    Hip external rotation    Knee flexion    Knee extension    Ankle dorsiflexion    Ankle plantarflexion    Ankle inversion    Ankle eversion     (Blank rows = not tested)  LOWER EXTREMITY SPECIAL TESTS:  {LEspecialtests:26242}  FUNCTIONAL TESTS:  18 inch chair transfer: Lt SLS: Rt SLS:  GAIT: Distance walked: *** Assistive device utilized: {Assistive devices:23999} Level of assistance: {Levels of assistance:24026} Comments: ***                                                                                                                                                                        TODAY'S TREATMENT  DATE: Therex:    HEP instruction/performance c cues for techniques, handout provided.  Trial set performed of each for comprehension and symptom assessment.  See below for exercise list  PATIENT EDUCATION:  Education details: HEP, POC Person educated: Patient Education method: Explanation, Demonstration, Verbal cues, and Handouts Education comprehension: verbalized understanding, returned demonstration, and verbal cues required  HOME EXERCISE PROGRAM: ***  ASSESSMENT:  CLINICAL IMPRESSION: Patient is a 89 y.o. who comes to clinic with complaints of chronic bilateral knee pain with mobility, strength and movement coordination deficits that impair their ability to perform usual daily and recreational functional activities without increase difficulty/symptoms at this time.  Patient to benefit from skilled PT services to address impairments and limitations to improve to previous level of function without restriction secondary to condition.   OBJECTIVE IMPAIRMENTS: {opptimpairments:25111}.   ACTIVITY LIMITATIONS: {activitylimitations:27494}  PARTICIPATION LIMITATIONS: {participationrestrictions:25113}  PERSONAL FACTORS: Time since onset of injury/illness/exacerbation and Medical history: arthritis, anemia, COPD, GERD, hyperlipidemia, Hep, HTN, osteoporosis are also affecting patient's functional outcome.   REHAB POTENTIAL: {rehabpotential:25112}  CLINICAL DECISION MAKING: {clinical decision making:25114}  EVALUATION COMPLEXITY: {Evaluation complexity:25115}   GOALS: Goals reviewed with patient? Yes  SHORT TERM GOALS: (target date for Short term goals are 3 weeks ***)   1.  Patient will demonstrate independent use of home exercise program to maintain progress from in clinic treatments.  Goal status: New  LONG TERM GOALS: (target dates for all long term goals are 10 weeks  *** )   1. Patient will demonstrate/report  pain at worst less than or equal to 2/10 to facilitate minimal limitation in daily activity secondary to pain symptoms.  Goal status: New   2. Patient will demonstrate independent use of home exercise program to facilitate ability to maintain/progress functional gains from skilled physical therapy services.  Goal status: New   3. Patient will demonstrate Patient specific functional scale avg > or = *** to indicate reduced disability due to condition.   Goal status: New   4.  Patient will demonstrate *** LE MMT 5/5 throughout to faciltiate usual transfers, stairs, squatting at [facility] for daily life.   Goal status: New   5.  Patient will demonstrate Goal status: New   6.  *** Goal status: New   7.  *** Goal Status: New   PLAN:  PT FREQUENCY: 1-2x/week  PT DURATION: 10 weeks  PLANNED INTERVENTIONS: Can include 02853- PT Re-evaluation, 97110-Therapeutic exercises, 97530- Therapeutic activity, [id number]- Neuromuscular re-education, 97535- Self Care, 97140- Manual therapy, [phone]- Gait training, [phone]- Orthotic Fit/training, [phone]- Canalith repositioning, [id number]- Aquatic Therapy, [phone]- Electrical stimulation (unattended), [id number] Physical performance testing, 97016- Vasopneumatic device, [id number]- Ultrasound, [id number]- Traction (mechanical), [id number]- Ionotophoresis 4mg /ml Dexamethasone,  20560 - Needle insertion w/o injection 1 or 2 muscles, 20561 - Needle insertion w/o injection 3 or more muscles.   Patient/Family education, Balance training, Stair training, Taping, Dry Needling, Joint mobilization, Joint manipulation, Spinal manipulation, Spinal mobilization, Scar mobilization, Vestibular training, Visual/preceptual remediation/compensation, DME instructions, Cryotherapy, and Moist heat.  All performed as medically necessary.  All included unless contraindicated  PLAN FOR NEXT SESSION: Review HEP knowledge/results.    Ozell Silvan, PT, DPT, OCS, ATC 12/15/24  12:43 PM     "

## 2024-12-19 ENCOUNTER — Ambulatory Visit: Admitting: Rehabilitative and Restorative Service Providers"
